# Patient Record
Sex: Female | Born: 1985 | Race: Black or African American | Hispanic: No | Marital: Single | Smoking: Never smoker
Health system: Southern US, Community
[De-identification: ages and names within clinical notes are randomized; demographics above are authoritative.]

## PROBLEM LIST (undated history)

## (undated) DIAGNOSIS — J45909 Unspecified asthma, uncomplicated: Secondary | ICD-10-CM

## (undated) DIAGNOSIS — K2970 Gastritis, unspecified, without bleeding: Secondary | ICD-10-CM

## (undated) DIAGNOSIS — K219 Gastro-esophageal reflux disease without esophagitis: Secondary | ICD-10-CM

## (undated) DIAGNOSIS — K5090 Crohn's disease, unspecified, without complications: Secondary | ICD-10-CM

## (undated) DIAGNOSIS — D649 Anemia, unspecified: Secondary | ICD-10-CM

## (undated) DIAGNOSIS — I10 Essential (primary) hypertension: Secondary | ICD-10-CM

## (undated) DIAGNOSIS — G4700 Insomnia, unspecified: Secondary | ICD-10-CM

## (undated) HISTORY — DX: Insomnia, unspecified: G47.00

## (undated) HISTORY — PX: BOWEL RESECTION: SHX1257

## (undated) HISTORY — DX: Gastro-esophageal reflux disease without esophagitis: K21.9

## (undated) HISTORY — DX: Unspecified asthma, uncomplicated: J45.909

## (undated) HISTORY — DX: Anemia, unspecified: D64.9

---

## 2019-01-27 ENCOUNTER — Emergency Department (HOSPITAL_COMMUNITY)
Admission: EM | Admit: 2019-01-27 | Discharge: 2019-01-27 | Disposition: A | Discharge status: Home / Self Care | Payer: Medicaid Other | Attending: Emergency Medicine | Admitting: Emergency Medicine

## 2019-01-27 ENCOUNTER — Encounter (HOSPITAL_COMMUNITY): Payer: Self-pay

## 2019-01-27 ENCOUNTER — Other Ambulatory Visit: Payer: Self-pay

## 2019-01-27 DIAGNOSIS — Z5321 Procedure and treatment not carried out due to patient leaving prior to being seen by health care provider: Secondary | ICD-10-CM | POA: Diagnosis not present

## 2019-01-27 DIAGNOSIS — R109 Unspecified abdominal pain: Secondary | ICD-10-CM | POA: Diagnosis not present

## 2019-01-27 LAB — URINALYSIS, ROUTINE W REFLEX MICROSCOPIC
Bacteria, UA: NONE SEEN
Bilirubin Urine: NEGATIVE
Glucose, UA: NEGATIVE mg/dL
Hgb urine dipstick: NEGATIVE
Ketones, ur: NEGATIVE mg/dL
Leukocytes,Ua: NEGATIVE
Nitrite: NEGATIVE
Protein, ur: 30 mg/dL — AB
Specific Gravity, Urine: 1.027 (ref 1.005–1.030)
pH: 6 (ref 5.0–8.0)

## 2019-01-27 LAB — COMPREHENSIVE METABOLIC PANEL
ALT: 38 U/L (ref 0–44)
AST: 27 U/L (ref 15–41)
Albumin: 4.3 g/dL (ref 3.5–5.0)
Alkaline Phosphatase: 61 U/L (ref 38–126)
Anion gap: 10 (ref 5–15)
BUN: 11 mg/dL (ref 6–20)
CO2: 23 mmol/L (ref 22–32)
Calcium: 9.6 mg/dL (ref 8.9–10.3)
Chloride: 106 mmol/L (ref 98–111)
Creatinine, Ser: 0.85 mg/dL (ref 0.44–1.00)
GFR calc Af Amer: >60 mL/min (ref >60)
GFR calc non Af Amer: >60 mL/min (ref >60)
Glucose, Bld: 130 mg/dL — ABNORMAL HIGH (ref 70–99)
Potassium: 4 mmol/L (ref 3.5–5.1)
Sodium: 139 mmol/L (ref 135–145)
Total Bilirubin: 0.8 mg/dL (ref 0.3–1.2)
Total Protein: 8.6 g/dL — ABNORMAL HIGH (ref 6.5–8.1)

## 2019-01-27 LAB — CBC
HCT: 40.8 % (ref 36.0–46.0)
Hemoglobin: 12.2 g/dL (ref 12.0–15.0)
MCH: 23.4 pg — ABNORMAL LOW (ref 26.0–34.0)
MCHC: 29.9 g/dL — ABNORMAL LOW (ref 30.0–36.0)
MCV: 78.2 fL — ABNORMAL LOW (ref 80.0–100.0)
Platelets: 273 10*3/uL (ref 150–400)
RBC: 5.22 MIL/uL — ABNORMAL HIGH (ref 3.87–5.11)
RDW: 17.9 % — ABNORMAL HIGH (ref 11.5–15.5)
WBC: 10 10*3/uL (ref 4.0–10.5)
nRBC: 0 % (ref 0.0–0.2)

## 2019-01-27 LAB — LIPASE, BLOOD: Lipase: 24 U/L (ref 11–51)

## 2019-01-27 LAB — I-STAT BETA HCG BLOOD, ED (MC, WL, AP ONLY): I-stat hCG, quantitative: <5 m[IU]/mL (ref <5)

## 2019-01-27 MED ORDER — SODIUM CHLORIDE 0.9% FLUSH: 3.0000 mL | Freq: Once | INTRAVENOUS | Status: DC

## 2019-01-27 NOTE — ED Triage Notes (Signed)
Patient c/o upper abdominal pain with vomiting since 0200 today. Patient denies any diarrhea.

## 2019-01-27 NOTE — ED Notes (Signed)
Pt called in for recheck vs and stated she was told she only had 3 people ahead of her, attempted to explain to pt that we take people according to acuity and pt ripped off bp cuff and left.

## 2019-02-17 ENCOUNTER — Other Ambulatory Visit: Payer: Self-pay

## 2019-02-17 ENCOUNTER — Ambulatory Visit: Payer: Medicaid Other | Admitting: Gastroenterology

## 2019-02-17 ENCOUNTER — Encounter: Payer: Self-pay | Admitting: Gastroenterology

## 2019-02-17 VITALS — BP 110/74 | HR 74 | Temp 97.1°F | Ht 65.0 in | Wt 216.0 lb

## 2019-02-17 DIAGNOSIS — D500 Iron deficiency anemia secondary to blood loss (chronic): Secondary | ICD-10-CM

## 2019-02-17 DIAGNOSIS — E739 Lactose intolerance, unspecified: Secondary | ICD-10-CM

## 2019-02-17 DIAGNOSIS — R1013 Epigastric pain: Secondary | ICD-10-CM | POA: Diagnosis not present

## 2019-02-17 DIAGNOSIS — R195 Other fecal abnormalities: Secondary | ICD-10-CM | POA: Diagnosis not present

## 2019-02-17 DIAGNOSIS — K219 Gastro-esophageal reflux disease without esophagitis: Secondary | ICD-10-CM | POA: Diagnosis not present

## 2019-02-17 MED ORDER — OMEPRAZOLE 40 MG PO CPDR: 40.0000 mg | DELAYED_RELEASE_CAPSULE | Freq: Every day | ORAL | 3 refills | Status: DC

## 2019-02-17 NOTE — Patient Instructions (Signed)
You have been scheduled for an abdominal ultrasound at [facility] (1st floor of hospital) on 02/27/2019 at 9am. Please arrive 15 minutes prior to your appointment for registration. Make certain not to have anything to eat or drink after midnight prior to your appointment. Should you need to reschedule your appointment, please contact radiology at [phone]. This test typically takes about 30 minutes to perform.  You have been scheduled for an endoscopy. Please follow written instructions given to you at your visit today. If you use inhalers (even only as needed), please bring them with you on the day of your procedure.   We will send Omeprazole to your pharmacy    Lactose-Free Diet, Adult If you have lactose intolerance, you are not able to digest lactose. Lactose is a natural sugar found mainly in dairy milk and dairy products. You may need to avoid all foods and beverages that contain lactose. A lactose-free diet can help you do this. Which foods have lactose? Lactose is found in dairy milk and dairy products, such as:  Yogurt.  Cheese.  Butter.  Margarine.  Sour cream.  Cream.  Whipped toppings and nondairy creamers.  Ice cream and other dairy-based desserts. Lactose is also found in foods or products made with dairy milk or milk ingredients. To find out whether a food contains dairy milk or a milk ingredient, look at the ingredients list. Avoid foods with the statement "May contain milk" and foods that contain:  Milk powder.  Whey.  Curd.  Caseinate.  Lactose.  Lactalbumin.  Lactoglobulin. What are alternatives to dairy milk and foods made with milk products?  Lactose-free milk.  Soy milk with added calcium and vitamin D.  Almond milk, coconut milk, rice milk, or other nondairy milk alternatives with added calcium and vitamin D. Note that these are low in protein.  Soy products, such as soy yogurt, soy cheese, soy ice cream, and soy-based sour  cream.  Other nut milk products, such as almond yogurt, almond cheese, cashew yogurt, cashew cheese, cashew ice cream, coconut yogurt, and coconut ice cream. What are tips for following this plan?  Do not consume foods, beverages, vitamins, minerals, or medicines containing lactose. Read ingredient lists carefully.  Look for the words "lactose-free" on labels.  Use lactase enzyme drops or tablets as directed by your health care provider.  Use lactose-free milk or a milk alternative, such as soy milk or almond milk, for drinking and cooking.  Make sure you get enough calcium and vitamin D in your diet. A lactose-free eating plan can be lacking in these important nutrients.  Take calcium and vitamin D supplements as directed by your health care provider. Talk to your health care provider about supplements if you are not able to get enough calcium and vitamin D from food. What foods can I eat?  Fruits All fresh, canned, frozen, or dried fruits that are not processed with lactose. Vegetables All fresh, frozen, and canned vegetables without cheese, cream, or butter sauces. Grains Any that are not made with dairy milk or dairy products. Meats and other proteins Any meat, fish, poultry, and other protein sources that are not made with dairy milk or dairy products. Soy cheese and yogurt. Fats and oils Any that are not made with dairy milk or dairy products. Beverages Lactose-free milk. Soy, rice, or almond milk with added calcium and vitamin D. Fruit and vegetable juices. Sweets and desserts Any that are not made with dairy milk or dairy products. Seasonings and condiments  Any that are not made with dairy milk or dairy products. Calcium Calcium is found in many foods that contain lactose and is important for bone health. The amount of calcium you need depends on your age:  Adults younger than 50 years: 1,000 mg of calcium a day.  Adults older than 50 years: 1,200 mg of calcium a day.  If you are not getting enough calcium, you may get it from other sources, including:  Orange juice with calcium added. There are 300-350 mg of calcium in 1 cup of orange juice.  Calcium-fortified soy milk. There are 300-400 mg of calcium in 1 cup of calcium-fortified soy milk.  Calcium-fortified rice or almond milk. There are 300 mg of calcium in 1 cup of calcium-fortified rice or almond milk.  Calcium-fortified breakfast cereals. There are 100-1,000 mg of calcium in calcium-fortified breakfast cereals.  Spinach, cooked. There are 145 mg of calcium in  cup of cooked spinach.  Edamame, cooked. There are 130 mg of calcium in  cup of cooked edamame.  Collard greens, cooked. There are 125 mg of calcium in  cup of cooked collard greens.  Kale, frozen or cooked. There are 90 mg of calcium in  cup of cooked or frozen kale.  Almonds. There are 95 mg of calcium in  cup of almonds.  Broccoli, cooked. There are 60 mg of calcium in 1 cup of cooked broccoli. The items listed above may not be a complete list of recommended foods and beverages. Contact a dietitian for more options. What foods are not recommended? Fruits None, unless they are made with dairy milk or dairy products. Vegetables None, unless they are made with dairy milk or dairy products. Grains Any grains that are made with dairy milk or dairy products. Meats and other proteins None, unless they are made with dairy milk or dairy products. Dairy All dairy products, including milk, goat's milk, buttermilk, kefir, acidophilus milk, flavored milk, evaporated milk, condensed milk, dulce de Edison, eggnog, yogurt, cheese, and cheese spreads. Fats and oils Any that are made with milk or milk products. Margarines and salad dressings that contain milk or cheese. Cream. Half and half. Cream cheese. Sour cream. Chip dips made with sour cream or yogurt. Beverages Hot chocolate. Cocoa with lactose. Instant iced teas. Powdered fruit  drinks. Smoothies made with dairy milk or yogurt. Sweets and desserts Any that are made with milk or milk products. Seasonings and condiments Chewing gum that has lactose. Spice blends if they contain lactose. Artificial sweeteners that contain lactose. Nondairy creamers. The items listed above may not be a complete list of foods and beverages to avoid. Contact a dietitian for more information. Summary  If you are lactose intolerant, it means that you have a hard time digesting lactose, a natural sugar found in milk and milk products.  Following a lactose-free diet can help you manage this condition.  Calcium is important for bone health and is found in many foods that contain lactose. Talk with your health care provider about other sources of calcium. This information is not intended to replace advice given to you by your health care provider. Make sure you discuss any questions you have with your health care provider. Document Released: 10/24/2001 Document Revised: 06/01/2017 Document Reviewed: 06/01/2017 Elsevier Patient Education  2020 [employer].

## 2019-02-17 NOTE — Progress Notes (Signed)
Morgan Chung    [id number]    26-Nov-1985  Primary Care Physician:Patient, No Pcp Per  Referring Physician: No referring provider defined for this encounter.   Chief complaint: Abdominal pain, GERD, Diarrhea  HPI: 33 year old female here for new patient visit.  She has intermittent upper abdominal pain, is episodic has 1 or 2 episodes every 6 months.  Patient thinks she has had 8 episodes in the past 4 years and she also has associated black tarry stool intermittently. This last episode lasted for about 3 days and she noticed black tarry stool once the pain subsided.  Her bowel habits and color has returned back to baseline.  She has lost around 17 pounds in the past 6 months. She feels reflux/GERD symptoms progressively worse. She has excessive bloating and flatulence with milk and dairy products, also has intermittent diarrhea. She was taking BC powder almost daily previously but has not had any recently.  Denies any family history of GI malignancy, celiac disease or IBD.  No outpatient encounter medications on file as of 02/17/2019.   No facility-administered encounter medications on file as of 02/17/2019.     Allergies as of 02/17/2019 - Review Complete 02/17/2019  Allergen Reaction Noted  . Latex  01/27/2019    History reviewed. No pertinent past medical history.  Past Surgical History:  Procedure Laterality Date  . CESAREAN SECTION      Family History  Problem Relation Age of Onset  . Hypertension Mother   . Irritable bowel syndrome Mother   . Diabetes Father   . Hypertension Father   . Irritable bowel syndrome Sister   . Colon cancer Neg Hx   . Pancreatic cancer Neg Hx   . Stomach cancer Neg Hx   . Esophageal cancer Neg Hx   . Liver cancer Neg Hx     Social History   Socioeconomic History  . Marital status: Single    Spouse name: Not on file  . Number of children: Not on file  . Years of education: Not on file  . Highest education level:  Not on file  Occupational History  . Not on file  Social Needs  . Financial resource strain: Not on file  . Food insecurity    Worry: Not on file    Inability: Not on file  . Transportation needs    Medical: Not on file    Non-medical: Not on file  Tobacco Use  . Smoking status: Never Smoker  . Smokeless tobacco: Never Used  Substance and Sexual Activity  . Alcohol use: Yes    Comment: occasionally  . Drug use: Never  . Sexual activity: Not on file  Lifestyle  . Physical activity    Days per week: Not on file    Minutes per session: Not on file  . Stress: Not on file  Relationships  . Social Herbalist on phone: Not on file    Gets together: Not on file    Attends religious service: Not on file    Active member of club or organization: Not on file    Attends meetings of clubs or organizations: Not on file    Relationship status: Not on file  . Intimate partner violence    Fear of current or ex partner: Not on file    Emotionally abused: Not on file    Physically abused: Not on file    Forced sexual activity: Not on  file  Other Topics Concern  . Not on file  Social History Narrative  . Not on file      Review of systems: Review of Systems  Constitutional: Negative for fever and chills.  HENT: Positive for sinus trouble Eyes: Negative for blurred vision.  Respiratory: Negative for cough, shortness of breath and wheezing.  Positive for shortness of breath Cardiovascular: Negative for chest pain and palpitations.  Gastrointestinal: as per HPI Genitourinary: Negative for dysuria, urgency, frequency and hematuria.  Musculoskeletal: Negative for myalgias, back pain and joint pain.  Skin: Negative for itching and rash.  Neurological: Negative for dizziness, tremors, focal weakness, seizures and loss of consciousness. Positive for headaches Endo/Heme/Allergies: Positive for seasonal allergies.  Psychiatric/Behavioral: Negative for depression, suicidal ideas  and hallucinations. Positive for anxiety and insomnia. All other systems reviewed and are negative.   Physical Exam: Vitals:   02/17/19 1408  Temp: (!) 97.1 F (36.2 C)   Body mass index is 37.08 kg/m. Gen:      No acute distress HEENT:  EOMI, sclera anicteric Neck:     No masses; no thyromegaly Lungs:    Clear to auscultation bilaterally; normal respiratory effort CV:         Regular rate and rhythm; no murmurs Abd:      + bowel sounds; soft, non-tender; no palpable masses, no distension Ext:    No edema; adequate peripheral perfusion Skin:      Warm and dry; no rash Neuro: alert and oriented x 3 Psych: normal mood and affect  Data Reviewed:  Reviewed labs, radiology imaging, old records and pertinent past GI work up   Assessment and Plan/Recommendations:  33 year old female with history of chronic GERD, lactose intolerance, complains of intermittent epigastric abdominal pain and melena Reviewed recent labs, normal hemoglobin and hematocrit with low MCV suggestive of iron deficiency Start omeprazole 40 mg daily Avoid NSAIDs We will schedule for EGD to exclude peptic ulcer disease or gastritis  Abdominal ultrasound to exclude gallstones/gallbladder disease  Discussed lactose-free diet  The risks and benefits as well as alternatives of endoscopic procedure(s) have been discussed and reviewed. All questions answered. The patient agrees to proceed.   Iona Beard , MD    CC: No ref. provider found

## 2019-02-23 ENCOUNTER — Emergency Department (HOSPITAL_COMMUNITY)
Admission: EM | Admit: 2019-02-23 | Discharge: 2019-02-23 | Discharge status: Absent without leave | Payer: Medicaid Other

## 2019-02-23 ENCOUNTER — Telehealth: Payer: Self-pay

## 2019-02-23 ENCOUNTER — Encounter: Payer: Self-pay | Admitting: Gastroenterology

## 2019-02-23 NOTE — Telephone Encounter (Signed)
Covid-19 screening questions   Do you now or have you had a fever in the last 14 days?  Do you have any respiratory symptoms of shortness of breath or cough now or in the last 14 days?  Do you have any family members or close contacts with diagnosed or suspected Covid-19 in the past 14 days?  Have you been tested for Covid-19 and found to be positive?       

## 2019-02-24 ENCOUNTER — Ambulatory Visit (AMBULATORY_SURGERY_CENTER): Payer: Medicaid Other | Admitting: Gastroenterology

## 2019-02-24 ENCOUNTER — Other Ambulatory Visit: Payer: Self-pay

## 2019-02-24 ENCOUNTER — Encounter: Payer: Self-pay | Admitting: Gastroenterology

## 2019-02-24 VITALS — BP 116/77 | HR 72 | Temp 98.2°F | Resp 21 | Ht 65.0 in | Wt 216.0 lb

## 2019-02-24 DIAGNOSIS — R1013 Epigastric pain: Secondary | ICD-10-CM

## 2019-02-24 DIAGNOSIS — K2970 Gastritis, unspecified, without bleeding: Secondary | ICD-10-CM | POA: Diagnosis not present

## 2019-02-24 MED ORDER — SODIUM CHLORIDE 0.9 % IV SOLN: 500.0000 mL | Freq: Once | INTRAVENOUS | Status: DC

## 2019-02-24 NOTE — Progress Notes (Signed)
Vitals-Nancy C. Temp-JunePt's states no medical or surgical changes since previsit or office visit.

## 2019-02-24 NOTE — Progress Notes (Signed)
To PACU, VSS. Report to [email or web address] 

## 2019-02-24 NOTE — Op Note (Signed)
[facility] Patient Name: Morgan Chung Procedure Date: 02/24/2019 3:27 PM MRN: [id number] Endoscopist: Napoleon FormKavitha V. Nandigam , MD Age: 33 Referring MD:  Date of Birth: [birth year] Gender: Female Account #: [id number] Procedure:                Upper GI endoscopy Indications:              Epigastric abdominal pain Medicines:                Monitored Anesthesia Care Procedure:                Pre-Anesthesia Assessment:                           - Prior to the procedure, a History and Physical                            was performed, and patient medications and                            allergies were reviewed. The patient's tolerance of                            previous anesthesia was also reviewed. The risks                            and benefits of the procedure and the sedation                            options and risks were discussed with the patient.                            All questions were answered, and informed consent                            was obtained. Prior Anticoagulants: The patient has                            taken no previous anticoagulant or antiplatelet                            agents. ASA Grade Assessment: II - A patient with                            mild systemic disease. After reviewing the risks                            and benefits, the patient was deemed in                            satisfactory condition to undergo the procedure.                           After obtaining informed consent, the endoscope was  passed under direct vision. Throughout the                            procedure, the patient's blood pressure, pulse, and                            oxygen saturations were monitored continuously. The                            Endoscope was introduced through the mouth, and                            advanced to the second part of duodenum. The upper                            GI endoscopy was  accomplished without difficulty.                            The patient tolerated the procedure well. Scope In: Scope Out: Findings:                 The Z-line was regular and was found 35 cm from the                            incisors.                           The esophagus was normal.                           Patchy mild inflammation characterized by                            congestion (edema) and erythema was found in the                            prepyloric region of the stomach. Biopsies were                            taken with a cold forceps for Helicobacter pylori                            testing.                           The examined duodenum was normal. Complications:            No immediate complications. Estimated Blood Loss:     Estimated blood loss was minimal. Impression:               - Z-line regular, 35 cm from the incisors.                           - Normal esophagus.                           -  Gastritis. Biopsied.                           - Normal examined duodenum. Recommendation:           - Patient has a contact number available for                            emergencies. The signs and symptoms of potential                            delayed complications were discussed with the                            patient. Return to normal activities tomorrow.                            Written discharge instructions were provided to the                            patient.                           - Resume previous diet.                           - Continue present medications.                           - Await pathology results.                           - Return to GI office in 3 months. Napoleon Form, MD 02/24/2019 3:41:33 PM This report has been signed electronically.

## 2019-02-24 NOTE — Patient Instructions (Signed)
Discharge instructions given. Biopsies taken. Resume previous medications. YOU HAD AN ENDOSCOPIC PROCEDURE TODAY AT THE  ENDOSCOPY CENTER:   Refer to the procedure report that was given to you for any specific questions about what was found during the examination.  If the procedure report does not answer your questions, please call your gastroenterologist to clarify.  If you requested that your care partner not be given the details of your procedure findings, then the procedure report has been included in a sealed envelope for you to review at your convenience later.  YOU SHOULD EXPECT: Some feelings of bloating in the abdomen. Passage of more gas than usual.  Walking can help get rid of the air that was put into your GI tract during the procedure and reduce the bloating. If you had a lower endoscopy (such as a colonoscopy or flexible sigmoidoscopy) you may notice spotting of blood in your stool or on the toilet paper. If you underwent a bowel prep for your procedure, you may not have a normal bowel movement for a few days.  Please Note:  You might notice some irritation and congestion in your nose or some drainage.  This is from the oxygen used during your procedure.  There is no need for concern and it should clear up in a day or so.  SYMPTOMS TO REPORT IMMEDIATELY:   Following upper endoscopy (EGD)  Vomiting of blood or coffee ground material  New chest pain or pain under the shoulder blades  Painful or persistently difficult swallowing  New shortness of breath  Fever of 100F or higher  Black, tarry-looking stools  For urgent or emergent issues, a gastroenterologist can be reached at any hour by calling [phone].   DIET:  We do recommend a small meal at first, but then you may proceed to your regular diet.  Drink plenty of fluids but you should avoid alcoholic beverages for 24 hours.  ACTIVITY:  You should plan to take it easy for the rest of today and you should NOT DRIVE  or use heavy machinery until tomorrow (because of the sedation medicines used during the test).    FOLLOW UP: Our staff will call the number listed on your records 48-72 hours following your procedure to check on you and address any questions or concerns that you may have regarding the information given to you following your procedure. If we do not reach you, we will leave a message.  We will attempt to reach you two times.  During this call, we will ask if you have developed any symptoms of COVID 19. If you develop any symptoms (ie: fever, flu-like symptoms, shortness of breath, cough etc.) before then, please call [phone].  If you test positive for Covid 19 in the 2 weeks post procedure, please call and report this information to us.    If any biopsies were taken you will be contacted by phone or by letter within the next 1-3 weeks.  Please call us at [phone] if you have not heard about the biopsies in 3 weeks.    SIGNATURES/CONFIDENTIALITY: You and/or your care partner have signed paperwork which will be entered into your electronic medical record.  These signatures attest to the fact that that the information above on your After Visit Summary has been reviewed and is understood.  Full responsibility of the confidentiality of this discharge information lies with you and/or your care-partner. 

## 2019-02-27 ENCOUNTER — Ambulatory Visit (HOSPITAL_COMMUNITY): Payer: Medicaid Other

## 2019-02-28 ENCOUNTER — Telehealth: Payer: Self-pay | Admitting: *Deleted

## 2019-02-28 NOTE — Telephone Encounter (Signed)
Left message on f/u call 

## 2019-02-28 NOTE — Telephone Encounter (Signed)
  Follow up Call-  Call back number 02/24/2019  Post procedure Call Back phone  # [phone]  Permission to leave phone message Yes     Patient questions:  Do you have a fever, pain , or abdominal swelling? No. Pain Score  0 *  Have you tolerated food without any problems? Yes.    Have you been able to return to your normal activities? Yes.    Do you have any questions about your discharge instructions: Diet   No. Medications  No. Follow up visit  No.  Do you have questions or concerns about your Care? No.  Actions: * If pain score is 4 or above: No action needed, pain <4.  1. Have you developed a fever since your procedure? no  2.   Have you had an respiratory symptoms (SOB or cough) since your procedure? no  3.   Have you tested positive for COVID 19 since your procedure no  4.   Have you had any family members/close contacts diagnosed with the COVID 19 since your procedure?  no   If yes to any of these questions please route to Joylene John, RN and Alphonsa Gin, Therapist, sports.

## 2019-03-02 ENCOUNTER — Encounter: Payer: Self-pay | Admitting: Gastroenterology

## 2019-03-27 ENCOUNTER — Other Ambulatory Visit: Payer: Self-pay | Admitting: Family Medicine

## 2019-03-27 DIAGNOSIS — R3915 Urgency of urination: Secondary | ICD-10-CM

## 2019-03-29 ENCOUNTER — Other Ambulatory Visit: Payer: Self-pay | Admitting: Family Medicine

## 2019-03-29 DIAGNOSIS — N644 Mastodynia: Secondary | ICD-10-CM

## 2019-04-12 ENCOUNTER — Other Ambulatory Visit: Payer: Self-pay | Admitting: Family Medicine

## 2019-04-17 ENCOUNTER — Other Ambulatory Visit: Payer: Self-pay | Admitting: Family Medicine

## 2019-04-17 DIAGNOSIS — N139 Obstructive and reflux uropathy, unspecified: Secondary | ICD-10-CM

## 2019-04-21 ENCOUNTER — Ambulatory Visit
Admission: RE | Admit: 2019-04-21 | Discharge: 2019-04-21 | Disposition: A | Discharge status: Home / Self Care | Payer: Medicaid Other | Source: Ambulatory Visit | Attending: Family Medicine | Admitting: Family Medicine

## 2019-04-21 ENCOUNTER — Ambulatory Visit: Payer: Medicaid Other

## 2019-04-21 ENCOUNTER — Other Ambulatory Visit: Payer: Self-pay

## 2019-04-21 DIAGNOSIS — N644 Mastodynia: Secondary | ICD-10-CM

## 2019-04-24 ENCOUNTER — Other Ambulatory Visit: Payer: Medicaid Other

## 2019-04-25 ENCOUNTER — Other Ambulatory Visit: Payer: Self-pay | Admitting: Family Medicine

## 2019-04-26 ENCOUNTER — Other Ambulatory Visit: Payer: Medicaid Other

## 2020-01-08 ENCOUNTER — Telehealth: Payer: Self-pay | Admitting: Gastroenterology

## 2020-01-08 NOTE — Telephone Encounter (Signed)
Pt states she is experiencing gastro problems, pt would like to be seen asap by Dr Lavon Paganini but nothing available before Oct.

## 2020-01-09 NOTE — Telephone Encounter (Signed)
There was a opportunity to move her appointment to 01/10/20 at 8:10 am. Patient agreed to this appointment.

## 2020-01-10 ENCOUNTER — Ambulatory Visit: Payer: Medicaid Other | Admitting: Gastroenterology

## 2020-01-10 ENCOUNTER — Encounter: Payer: Self-pay | Admitting: Gastroenterology

## 2020-01-10 VITALS — BP 106/68 | HR 104 | Ht 65.0 in | Wt 208.0 lb

## 2020-01-10 DIAGNOSIS — R110 Nausea: Secondary | ICD-10-CM | POA: Diagnosis not present

## 2020-01-10 DIAGNOSIS — R1013 Epigastric pain: Secondary | ICD-10-CM | POA: Diagnosis not present

## 2020-01-10 DIAGNOSIS — K219 Gastro-esophageal reflux disease without esophagitis: Secondary | ICD-10-CM | POA: Diagnosis not present

## 2020-01-10 DIAGNOSIS — K588 Other irritable bowel syndrome: Secondary | ICD-10-CM

## 2020-01-10 DIAGNOSIS — R109 Unspecified abdominal pain: Secondary | ICD-10-CM

## 2020-01-10 MED ORDER — ONDANSETRON HCL 4 MG PO TABS: 4.0000 mg | ORAL_TABLET | Freq: Three times a day (TID) | ORAL | 3 refills | Status: DC | PRN

## 2020-01-10 MED ORDER — DICYCLOMINE HCL 20 MG PO TABS: 20.0000 mg | ORAL_TABLET | Freq: Three times a day (TID) | ORAL | 3 refills | Status: DC | PRN

## 2020-01-10 MED ORDER — OMEPRAZOLE 40 MG PO CPDR: 40.0000 mg | DELAYED_RELEASE_CAPSULE | Freq: Every day | ORAL | 3 refills | Status: DC

## 2020-01-10 NOTE — Patient Instructions (Addendum)
You have been scheduled for an abdominal ultrasound at [facility]iology (1st floor of hospital) on 01/16/2020 at 8:30am. Please arrive 15 minutes prior to your appointment for registration. Make certain not to have anything to eat or drink after midnight prior to your appointment. Should you need to reschedule your appointment, please contact radiology at [phone]. This test typically takes about 30 minutes to perform.  We will refill Omeprazole, Dicyclomine and Zofran for you today  Follow up in 4 months   I appreciate the  opportunity to care for you  Thank You   Marsa Aris , MD

## 2020-01-10 NOTE — Progress Notes (Signed)
Morgan Chung    [id number]    Apr 14, 1986  Primary Care Physician:Patient, No Pcp Per  Referring Physician: No referring provider defined for this encounter.   Chief complaint:  Nausea, abdominal pain  HPI:  34 year old very pleasant female here for follow-up visit with complaints of persistent nausea and intermittent vomiting.  She continues to have epigastric abdominal pain. Symptoms worse when she eats heavy meals  EGD 02/24/2019: Gastritis otherwise normal exam. Negative for H.pylori  She is taking omeprazole intermittently.  Denies any dysphagia or heartburn when she takes omeprazole.  She has not done abdominal ultrasound yet, was not covered by her insurance and her symptoms improved for a few months before she started having recurrent symptoms  No change in bowel habits, decreased appetite or weight loss.     Outpatient Encounter Medications as of 01/10/2020  Medication Sig  . ARIPiprazole (ABILIFY) 2 MG tablet Take 2 mg by mouth daily.  Marland Kitchen omeprazole (PRILOSEC) 40 MG capsule Take 1 capsule (40 mg total) by mouth daily.  . propranolol (INDERAL) 10 MG tablet Take 10 mg by mouth 3 (three) times daily as needed.  . zolpidem (AMBIEN) 10 MG tablet Take 10 mg by mouth at bedtime as needed.  . [DISCONTINUED] omeprazole (PRILOSEC) 40 MG capsule Take 1 capsule (40 mg total) by mouth daily.  Marland Kitchen dicyclomine (BENTYL) 20 MG tablet Take 1 tablet (20 mg total) by mouth every 8 (eight) hours as needed for spasms.  . ondansetron (ZOFRAN) 4 MG tablet Take 1 tablet (4 mg total) by mouth every 8 (eight) hours as needed for nausea or vomiting (as needed).   No facility-administered encounter medications on file as of 01/10/2020.    Allergies as of 01/10/2020 - Review Complete 01/10/2020  Allergen Reaction Noted  . Latex  01/27/2019    Past Medical History:  Diagnosis Date  . GERD (gastroesophageal reflux disease)   . Insomnia     Past Surgical History:  Procedure  Laterality Date  . CESAREAN SECTION      Family History  Problem Relation Age of Onset  . Hypertension Mother   . Irritable bowel syndrome Mother   . Diabetes Father   . Hypertension Father   . Irritable bowel syndrome Sister   . Colon cancer Neg Hx   . Pancreatic cancer Neg Hx   . Stomach cancer Neg Hx   . Esophageal cancer Neg Hx   . Liver cancer Neg Hx     Social History   Socioeconomic History  . Marital status: Single    Spouse name: Not on file  . Number of children: Not on file  . Years of education: Not on file  . Highest education level: Not on file  Occupational History  . Not on file  Tobacco Use  . Smoking status: Never Smoker  . Smokeless tobacco: Never Used  Vaping Use  . Vaping Use: Never used  Substance and Sexual Activity  . Alcohol use: Yes    Comment: occasionally  . Drug use: Never  . Sexual activity: Yes    Partners: Male  Other Topics Concern  . Not on file  Social History Narrative  . Not on file   Social Determinants of Health   Financial Resource Strain:   . Difficulty of Paying Living Expenses: Not on file  Food Insecurity:   . Worried About Programme researcher, broadcasting/film/video in the Last Year: Not on file  . Ran Out  of Food in the Last Year: Not on file  Transportation Needs:   . Lack of Transportation (Medical): Not on file  . Lack of Transportation (Non-Medical): Not on file  Physical Activity:   . Days of Exercise per Week: Not on file  . Minutes of Exercise per Session: Not on file  Stress:   . Feeling of Stress : Not on file  Social Connections:   . Frequency of Communication with Friends and Family: Not on file  . Frequency of Social Gatherings with Friends and Family: Not on file  . Attends Religious Services: Not on file  . Active Member of Clubs or Organizations: Not on file  . Attends Banker Meetings: Not on file  . Marital Status: Not on file  Intimate Partner Violence:   . Fear of Current or Ex-Partner: Not on  file  . Emotionally Abused: Not on file  . Physically Abused: Not on file  . Sexually Abused: Not on file      Review of systems: All other review of systems negative except as mentioned in the HPI.   Physical Exam: Vitals:   01/10/20 0823  BP: 106/68  Pulse: (!) 104   Body mass index is 34.61 kg/m. Gen:      No acute distress HEENT:  sclera anicteric Abd:      soft, non-tender; no palpable masses, no distension Ext:    No edema Neuro: alert and oriented x 3 Psych: normal mood and affect  Data Reviewed:  Reviewed labs, radiology imaging, old records and pertinent past GI work up   Assessment and Plan/Recommendations:  34 year old very pleasant female with complaints of epigastric abdominal pain and nausea  We will obtain abdominal ultrasound to exclude gallbladder disease  GERD and gastritis: Omeprazole 40 mg daily Antireflux measures  Nausea: Use Zofran as needed  IBS/abdominal cramping: Dicyclomine 3 mg every 8 hours as needed  Return in 4 months or sooner if needed   The patient was provided an opportunity to ask questions and all were answered. The patient agreed with the plan and demonstrated an understanding of the instructions.  Iona Beard , MD    CC: No ref. provider found

## 2020-01-16 ENCOUNTER — Ambulatory Visit (HOSPITAL_COMMUNITY): Admission: RE | Admit: 2020-01-16 | Payer: Medicaid Other | Source: Ambulatory Visit

## 2020-01-24 ENCOUNTER — Encounter: Payer: Self-pay | Admitting: Gastroenterology

## 2020-03-07 ENCOUNTER — Ambulatory Visit: Payer: Medicaid Other | Admitting: Gastroenterology

## 2020-05-20 ENCOUNTER — Ambulatory Visit: Payer: Medicaid Other | Admitting: Obstetrics

## 2020-07-10 ENCOUNTER — Ambulatory Visit: Payer: Medicaid Other | Admitting: Gastroenterology

## 2020-07-27 ENCOUNTER — Emergency Department (HOSPITAL_COMMUNITY)
Admission: EM | Admit: 2020-07-27 | Discharge: 2020-07-27 | Disposition: A | Discharge status: Home / Self Care | Payer: Medicaid Other | Attending: Emergency Medicine | Admitting: Emergency Medicine

## 2020-07-27 ENCOUNTER — Encounter (HOSPITAL_COMMUNITY): Payer: Self-pay | Admitting: Emergency Medicine

## 2020-07-27 DIAGNOSIS — Z91040 Latex allergy status: Secondary | ICD-10-CM | POA: Diagnosis not present

## 2020-07-27 DIAGNOSIS — R112 Nausea with vomiting, unspecified: Secondary | ICD-10-CM | POA: Insufficient documentation

## 2020-07-27 DIAGNOSIS — R197 Diarrhea, unspecified: Secondary | ICD-10-CM | POA: Diagnosis not present

## 2020-07-27 DIAGNOSIS — R1013 Epigastric pain: Secondary | ICD-10-CM | POA: Diagnosis not present

## 2020-07-27 DIAGNOSIS — K219 Gastro-esophageal reflux disease without esophagitis: Secondary | ICD-10-CM | POA: Diagnosis not present

## 2020-07-27 DIAGNOSIS — Z79899 Other long term (current) drug therapy: Secondary | ICD-10-CM | POA: Insufficient documentation

## 2020-07-27 LAB — COMPREHENSIVE METABOLIC PANEL
ALT: 13 U/L (ref 0–44)
AST: 16 U/L (ref 15–41)
Albumin: 3.9 g/dL (ref 3.5–5.0)
Alkaline Phosphatase: 53 U/L (ref 38–126)
Anion gap: 12 (ref 5–15)
BUN: 10 mg/dL (ref 6–20)
CO2: 20 mmol/L — ABNORMAL LOW (ref 22–32)
Calcium: 9.5 mg/dL (ref 8.9–10.3)
Chloride: 103 mmol/L (ref 98–111)
Creatinine, Ser: 0.79 mg/dL (ref 0.44–1.00)
GFR, Estimated: >60 mL/min (ref >60)
Glucose, Bld: 111 mg/dL — ABNORMAL HIGH (ref 70–99)
Potassium: 3.6 mmol/L (ref 3.5–5.1)
Sodium: 135 mmol/L (ref 135–145)
Total Bilirubin: 0.5 mg/dL (ref 0.3–1.2)
Total Protein: 8.1 g/dL (ref 6.5–8.1)

## 2020-07-27 LAB — I-STAT BETA HCG BLOOD, ED (MC, WL, AP ONLY): I-stat hCG, quantitative: <5 m[IU]/mL (ref <5)

## 2020-07-27 LAB — URINALYSIS, ROUTINE W REFLEX MICROSCOPIC
Bilirubin Urine: NEGATIVE
Glucose, UA: NEGATIVE mg/dL
Hgb urine dipstick: NEGATIVE
Ketones, ur: NEGATIVE mg/dL
Leukocytes,Ua: NEGATIVE
Nitrite: NEGATIVE
Protein, ur: NEGATIVE mg/dL
Specific Gravity, Urine: 1.008 (ref 1.005–1.030)
pH: 8 (ref 5.0–8.0)

## 2020-07-27 LAB — CBC
HCT: 37.1 % (ref 36.0–46.0)
Hemoglobin: 11.1 g/dL — ABNORMAL LOW (ref 12.0–15.0)
MCH: 22 pg — ABNORMAL LOW (ref 26.0–34.0)
MCHC: 29.9 g/dL — ABNORMAL LOW (ref 30.0–36.0)
MCV: 73.5 fL — ABNORMAL LOW (ref 80.0–100.0)
Platelets: 284 10*3/uL (ref 150–400)
RBC: 5.05 MIL/uL (ref 3.87–5.11)
RDW: 17.9 % — ABNORMAL HIGH (ref 11.5–15.5)
WBC: 9.8 10*3/uL (ref 4.0–10.5)
nRBC: 0 % (ref 0.0–0.2)

## 2020-07-27 LAB — LIPASE, BLOOD: Lipase: 23 U/L (ref 11–51)

## 2020-07-27 MED ORDER — DICYCLOMINE HCL 10 MG PO CAPS
10.0000 mg | ORAL_CAPSULE | Freq: Once | ORAL | Status: AC
  Administered 2020-07-27: 10 mg via ORAL
  Filled 2020-07-27: qty 1

## 2020-07-27 MED ORDER — ONDANSETRON 4 MG PO TBDP: 4.0000 mg | ORAL_TABLET | Freq: Three times a day (TID) | ORAL | 0 refills | Status: DC | PRN

## 2020-07-27 MED ORDER — ONDANSETRON 4 MG PO TBDP
4.0000 mg | ORAL_TABLET | Freq: Once | ORAL | Status: AC | PRN
  Administered 2020-07-27: 4 mg via ORAL
  Filled 2020-07-27: qty 1

## 2020-07-27 MED ORDER — SODIUM CHLORIDE 0.9 % IV BOLUS
1000.0000 mL | Freq: Once | INTRAVENOUS | Status: AC
  Administered 2020-07-27: 1000 mL via INTRAVENOUS

## 2020-07-27 MED ORDER — PANTOPRAZOLE SODIUM 40 MG IV SOLR
40.0000 mg | Freq: Once | INTRAVENOUS | Status: AC
  Administered 2020-07-27: 40 mg via INTRAVENOUS
  Filled 2020-07-27: qty 40

## 2020-07-27 MED ORDER — MORPHINE SULFATE (PF) 4 MG/ML IV SOLN
4.0000 mg | Freq: Once | INTRAVENOUS | Status: AC
  Administered 2020-07-27: 4 mg via INTRAVENOUS
  Filled 2020-07-27: qty 1

## 2020-07-27 MED ORDER — ONDANSETRON HCL 4 MG/2ML IJ SOLN
4.0000 mg | Freq: Once | INTRAMUSCULAR | Status: AC
  Administered 2020-07-27: 4 mg via INTRAVENOUS
  Filled 2020-07-27: qty 2

## 2020-07-27 MED ORDER — METHOCARBAMOL 500 MG PO TABS: 500.0000 mg | ORAL_TABLET | Freq: Two times a day (BID) | ORAL | 0 refills | Status: DC

## 2020-07-27 NOTE — ED Triage Notes (Signed)
Per EMS, patient from home, c/o abdominal pain with N/V x 4 days. Hx gastritis.

## 2020-07-27 NOTE — Discharge Instructions (Signed)
Your work-up today was reassuring, please follow-up with your GI doctor.  Continue to take your medications for your acid reflux in addition to the Bentyl.  I also prescribed you some more Zofran, make sure to stay hydrated.  Also prescribed you the muscle relaxant as requested, please speak to your pharmacist about any new medications in regards to side effects or interactions with other medications today.  If you have any new worsening concerning symptom please come back to the emergency department.

## 2020-07-27 NOTE — ED Provider Notes (Signed)
[facility]-EMERGENCY DEPT Provider Note   CSN: [id number] Arrival date & time: 07/27/20  1243     History Chief Complaint  Patient presents with  . Abdominal Pain  . Emesis    Morgan Chung is a 35 y.o. female past medical history of GERD, gastritis that presents the emergency department today for complaint of epigastric abdominal pain with nausea vomiting for the last 4 days.  Patient states that she has not been able to take any of her medications or tolerate p.o. since last night.  States that she woke up at 4 AM with continuous vomiting, no hematemesis.  Patient states that she does have a history of gastritis, however this is the worst that its been, states that she has been able to take any of her medications. Is on zofran, bentyl, PPI.  States that she did have an episode of diarrhea last night as well, nonbloody.  Has not any diarrhea today.  Patient denies any fevers or chills, does have a temperature of 99.5 here.  Denies any headache, URI symptoms, joint pain, myalgias, back pain.  Denies any dysuria or hematuria.  Denies any vaginal complaints.  Patient describes her epigastric pain as a squeezing sensation in the center of her abdomen, last for about a couple minutes and then will be relieved.  States that when this relieved the pain is a 3 out of 10 minutes dull, however when the pain is at its worst is a 10 out of 10.  Does not radiate anywhere.  Does have a GI doctor that she follows, next appointment is in a month.  No chest pain or shortness of breath.  HPI     Past Medical History:  Diagnosis Date  . GERD (gastroesophageal reflux disease)   . Insomnia     There are no problems to display for this patient.   Past Surgical History:  Procedure Laterality Date  . CESAREAN SECTION       OB History   No obstetric history on file.     Family History  Problem Relation Age of Onset  . Hypertension Mother   . Irritable bowel syndrome Mother   .  Diabetes Father   . Hypertension Father   . Irritable bowel syndrome Sister   . Colon cancer Neg Hx   . Pancreatic cancer Neg Hx   . Stomach cancer Neg Hx   . Esophageal cancer Neg Hx   . Liver cancer Neg Hx     Social History   Tobacco Use  . Smoking status: Never Smoker  . Smokeless tobacco: Never Used  Vaping Use  . Vaping Use: Never used  Substance Use Topics  . Alcohol use: Yes    Comment: occasionally  . Drug use: Never    Home Medications Prior to Admission medications   Medication Sig Start Date End Date Taking? Authorizing Provider  methocarbamol (ROBAXIN) 500 MG tablet Take 1 tablet (500 mg total) by mouth 2 (two) times daily. 07/27/20  Yes Farrel Gordon, PA-C  ondansetron (ZOFRAN ODT) 4 MG disintegrating tablet Take 1 tablet (4 mg total) by mouth every 8 (eight) hours as needed for nausea or vomiting. 07/27/20  Yes Farrel Gordon, PA-C  ARIPiprazole (ABILIFY) 2 MG tablet Take 2 mg by mouth daily. 01/09/20   [provider]  dicyclomine (BENTYL) 20 MG tablet Take 1 tablet (20 mg total) by mouth every 8 (eight) hours as needed for spasms. 01/10/20   Napoleon Form, MD  omeprazole (PRILOSEC)  40 MG capsule Take 1 capsule (40 mg total) by mouth daily. 01/10/20   Napoleon Form, MD  propranolol (INDERAL) 10 MG tablet Take 10 mg by mouth 3 (three) times daily as needed. 11/15/19   [provider]  zolpidem (AMBIEN) 10 MG tablet Take 10 mg by mouth at bedtime as needed. 01/09/20   [provider]    Allergies    Latex  Review of Systems   Review of Systems  Constitutional: Negative for chills, diaphoresis, fatigue and fever.  HENT: Negative for congestion, sore throat and trouble swallowing.   Eyes: Negative for pain and visual disturbance.  Respiratory: Negative for cough, shortness of breath and wheezing.   Cardiovascular: Negative for chest pain, palpitations and leg swelling.  Gastrointestinal: Positive for abdominal pain, diarrhea,  nausea and vomiting. Negative for abdominal distention.  Genitourinary: Negative for difficulty urinating.  Musculoskeletal: Negative for back pain, neck pain and neck stiffness.  Skin: Negative for pallor.  Neurological: Negative for dizziness, speech difficulty, weakness and headaches.  Psychiatric/Behavioral: Negative for confusion.    Physical Exam Updated Vital Signs BP 124/79   Pulse 68   Temp 99.5 F (37.5 C) (Oral)   Resp 13   LMP 07/13/2020   SpO2 100%   Physical Exam Constitutional:      General: She is not in acute distress.    Appearance: Normal appearance. She is not ill-appearing, toxic-appearing or diaphoretic.  HENT:     Mouth/Throat:     Mouth: Mucous membranes are moist.     Pharynx: Oropharynx is clear.  Eyes:     General: No scleral icterus.    Extraocular Movements: Extraocular movements intact.     Pupils: Pupils are equal, round, and reactive to light.  Cardiovascular:     Rate and Rhythm: Normal rate and regular rhythm.     Pulses: Normal pulses.     Heart sounds: Normal heart sounds.  Pulmonary:     Effort: Pulmonary effort is normal. No respiratory distress.     Breath sounds: Normal breath sounds. No stridor. No wheezing, rhonchi or rales.  Chest:     Chest wall: No tenderness.  Abdominal:     General: Abdomen is flat. There is no distension.     Palpations: Abdomen is soft.     Tenderness: There is no abdominal tenderness. There is no guarding or rebound.  Musculoskeletal:        General: No swelling or tenderness. Normal range of motion.     Cervical back: Normal range of motion and neck supple. No rigidity.     Right lower leg: No edema.     Left lower leg: No edema.  Skin:    General: Skin is warm and dry.     Capillary Refill: Capillary refill takes less than 2 seconds.     Coloration: Skin is not pale.  Neurological:     General: No focal deficit present.     Mental Status: She is alert and oriented to person, place, and time.   Psychiatric:        Mood and Affect: Mood normal.        Behavior: Behavior normal.     ED Results / Procedures / Treatments   Labs (all labs ordered are listed, but only abnormal results are displayed) Labs Reviewed  COMPREHENSIVE METABOLIC PANEL - Abnormal; Notable for the following components:      Result Value   CO2 20 (*)    Glucose, Bld 111 (*)  All other components within normal limits  CBC - Abnormal; Notable for the following components:   Hemoglobin 11.1 (*)    MCV 73.5 (*)    MCH 22.0 (*)    MCHC 29.9 (*)    RDW 17.9 (*)    All other components within normal limits  URINALYSIS, ROUTINE W REFLEX MICROSCOPIC - Abnormal; Notable for the following components:   Color, Urine STRAW (*)    All other components within normal limits  LIPASE, BLOOD  I-STAT BETA HCG BLOOD, ED ([facility], [facility], [facility] ONLY)    EKG None  Radiology No results found.  Procedures Procedures   Medications Ordered in ED Medications  ondansetron (ZOFRAN-ODT) disintegrating tablet 4 mg (4 mg Oral Given 07/27/20 1252)  sodium chloride 0.9 % bolus 1,000 mL (1,000 mLs Intravenous New Bag/Given 07/27/20 1458)  ondansetron (ZOFRAN) injection 4 mg (4 mg Intravenous Given 07/27/20 1502)  morphine 4 MG/ML injection 4 mg (4 mg Intravenous Given 07/27/20 1459)  dicyclomine (BENTYL) capsule 10 mg (10 mg Oral Given 07/27/20 1511)  pantoprazole (PROTONIX) injection 40 mg (40 mg Intravenous Given 07/27/20 1459)    ED Course  I have reviewed the triage vital signs and the nursing notes.  Pertinent labs & imaging results that were available during my care of the patient were reviewed by me and considered in my medical decision making (see chart for details).    MDM Rules/Calculators/A&P                         Morgan Chung is a 35 y.o. female past medical history of GERD, gastritis that presents the emergency department today for complaint of epigastric abdominal pain with nausea vomiting for the last 4 days.  Patient  has not seen her GI doctor in a while, does have an appointment in 1 month.  Is on Zofran, Bentyl, PPIs, however has not been able to take these in the past couple of days due to nausea/vomiting. No acute abdomen.  No tenderness to palpation on exam.    Work-up today unremarkable, hemoglobin is not too far from baseline.  Upon reevaluation after Bentyl, Zofran, fluids, morphine, Protonix patient states that she feels much better.  Passed p.o. challenge.  Patient to follow-up with GI doctor.  Could be gastritis, could also be gastroenteritis with addition to diarrhea since that has been pretty prevalent in this area in the last couple of weeks.  Electrolytes are normal.  Has not vomited while being here for 4-1/2 hours.  Patient asking to leave at this time, is eating crackers.  Asking for prescription of Robaxin for some muscle pain, patient will follow up with her GI doctor.  Doubt need for further emergent work up at this time. I explained the diagnosis and have given explicit precautions to return to the ER including for any other new or worsening symptoms. The patient understands and accepts the medical plan as it's been dictated and I have answered their questions. Discharge instructions concerning home care and prescriptions have been given. The patient is STABLE and is discharged to home in good condition.    Final Clinical Impression(s) / ED Diagnoses Final diagnoses:  Nausea vomiting and diarrhea    Rx / DC Orders ED Discharge Orders         Ordered    ondansetron (ZOFRAN ODT) 4 MG disintegrating tablet  Every 8 hours PRN        07/27/20 1703    methocarbamol (ROBAXIN) 500  MG tablet  2 times daily        07/27/20 1703           Farrel Gordonatel, Tillmon Kisling, PA-C 07/27/20 1710    Koleen DistanceWright, Anna G, MD 07/27/20 1800

## 2020-07-29 ENCOUNTER — Encounter (HOSPITAL_COMMUNITY): Payer: Self-pay

## 2020-07-29 ENCOUNTER — Other Ambulatory Visit: Payer: Self-pay

## 2020-07-29 DIAGNOSIS — R1013 Epigastric pain: Secondary | ICD-10-CM | POA: Insufficient documentation

## 2020-07-29 DIAGNOSIS — K219 Gastro-esophageal reflux disease without esophagitis: Secondary | ICD-10-CM | POA: Diagnosis not present

## 2020-07-29 DIAGNOSIS — Z91040 Latex allergy status: Secondary | ICD-10-CM | POA: Diagnosis not present

## 2020-07-29 DIAGNOSIS — R112 Nausea with vomiting, unspecified: Secondary | ICD-10-CM | POA: Insufficient documentation

## 2020-07-29 LAB — CBC
HCT: 34.6 % — ABNORMAL LOW (ref 36.0–46.0)
Hemoglobin: 10.1 g/dL — ABNORMAL LOW (ref 12.0–15.0)
MCH: 22 pg — ABNORMAL LOW (ref 26.0–34.0)
MCHC: 29.2 g/dL — ABNORMAL LOW (ref 30.0–36.0)
MCV: 75.2 fL — ABNORMAL LOW (ref 80.0–100.0)
Platelets: 247 10*3/uL (ref 150–400)
RBC: 4.6 MIL/uL (ref 3.87–5.11)
RDW: 17.8 % — ABNORMAL HIGH (ref 11.5–15.5)
WBC: 6.4 10*3/uL (ref 4.0–10.5)
nRBC: 0 % (ref 0.0–0.2)

## 2020-07-29 LAB — COMPREHENSIVE METABOLIC PANEL
ALT: 14 U/L (ref 0–44)
AST: 17 U/L (ref 15–41)
Albumin: 4 g/dL (ref 3.5–5.0)
Alkaline Phosphatase: 47 U/L (ref 38–126)
Anion gap: 13 (ref 5–15)
BUN: 9 mg/dL (ref 6–20)
CO2: 20 mmol/L — ABNORMAL LOW (ref 22–32)
Calcium: 9.4 mg/dL (ref 8.9–10.3)
Chloride: 106 mmol/L (ref 98–111)
Creatinine, Ser: 1.01 mg/dL — ABNORMAL HIGH (ref 0.44–1.00)
GFR, Estimated: >60 mL/min (ref >60)
Glucose, Bld: 93 mg/dL (ref 70–99)
Potassium: 3.6 mmol/L (ref 3.5–5.1)
Sodium: 139 mmol/L (ref 135–145)
Total Bilirubin: 0.6 mg/dL (ref 0.3–1.2)
Total Protein: 7.8 g/dL (ref 6.5–8.1)

## 2020-07-29 LAB — LIPASE, BLOOD: Lipase: 24 U/L (ref 11–51)

## 2020-07-29 LAB — I-STAT BETA HCG BLOOD, ED (MC, WL, AP ONLY): I-stat hCG, quantitative: <5 m[IU]/mL (ref <5)

## 2020-07-29 NOTE — ED Triage Notes (Signed)
Patient reports that she was diagnosed with gastritis and has been prescribed meds that are not working. Patient states there is a burning sensation in the mid upper abdomen. paitent also c/o nausea.

## 2020-07-30 ENCOUNTER — Encounter (HOSPITAL_COMMUNITY): Payer: Self-pay

## 2020-07-30 ENCOUNTER — Emergency Department (HOSPITAL_COMMUNITY): Payer: Medicaid Other

## 2020-07-30 ENCOUNTER — Emergency Department (HOSPITAL_COMMUNITY)
Admission: EM | Admit: 2020-07-30 | Discharge: 2020-07-30 | Disposition: A | Discharge status: Home / Self Care | Payer: Medicaid Other | Attending: Emergency Medicine | Admitting: Emergency Medicine

## 2020-07-30 DIAGNOSIS — R1010 Upper abdominal pain, unspecified: Secondary | ICD-10-CM

## 2020-07-30 HISTORY — DX: Gastritis, unspecified, without bleeding: K29.70

## 2020-07-30 LAB — URINALYSIS, ROUTINE W REFLEX MICROSCOPIC
Bacteria, UA: NONE SEEN
Glucose, UA: NEGATIVE mg/dL
Hgb urine dipstick: NEGATIVE
Ketones, ur: 5 mg/dL — AB
Leukocytes,Ua: NEGATIVE
Nitrite: NEGATIVE
Protein, ur: 30 mg/dL — AB
Specific Gravity, Urine: 1.036 — ABNORMAL HIGH (ref 1.005–1.030)
pH: 5 (ref 5.0–8.0)

## 2020-07-30 MED ORDER — ONDANSETRON HCL 4 MG/2ML IJ SOLN
4.0000 mg | Freq: Once | INTRAMUSCULAR | Status: AC
  Administered 2020-07-30: 4 mg via INTRAVENOUS
  Filled 2020-07-30: qty 2

## 2020-07-30 MED ORDER — HYDROMORPHONE HCL 1 MG/ML IJ SOLN
1.0000 mg | Freq: Once | INTRAMUSCULAR | Status: AC
  Administered 2020-07-30: 1 mg via INTRAVENOUS
  Filled 2020-07-30: qty 1

## 2020-07-30 MED ORDER — SODIUM CHLORIDE 0.9 % IV BOLUS
1000.0000 mL | Freq: Once | INTRAVENOUS | Status: AC
  Administered 2020-07-30: 1000 mL via INTRAVENOUS

## 2020-07-30 MED ORDER — IOHEXOL 300 MG/ML  SOLN
100.0000 mL | Freq: Once | INTRAMUSCULAR | Status: AC | PRN
  Administered 2020-07-30: 100 mL via INTRAVENOUS

## 2020-07-30 MED ORDER — METOCLOPRAMIDE HCL 5 MG/ML IJ SOLN
10.0000 mg | Freq: Once | INTRAMUSCULAR | Status: AC
  Administered 2020-07-30: 10 mg via INTRAVENOUS
  Filled 2020-07-30: qty 2

## 2020-07-30 MED ORDER — AMOXICILLIN-POT CLAVULANATE 875-125 MG PO TABS: 1.0000 | ORAL_TABLET | Freq: Two times a day (BID) | ORAL | 0 refills | Status: DC

## 2020-07-30 MED ORDER — HYDROCODONE-ACETAMINOPHEN 5-325 MG PO TABS: 1.0000 | ORAL_TABLET | ORAL | 0 refills | Status: DC | PRN

## 2020-07-30 NOTE — ED Provider Notes (Signed)
[facility]-EMERGENCY DEPT Provider Note   CSN: [id number] Arrival date & time: 07/29/20  1849     History Chief Complaint  Patient presents with  . Abdominal Pain  . Emesis    Morgan Chung is a 35 y.o. female.  Patient presents to the emergency department for recurrent mid upper abdominal pain with nausea and vomiting.  Patient reports that she did feel better after she was treated in the emergency department the other day but 2 nights ago pain returned.  Since then she has been taking her medications exactly as prescribed but has not had any relief.  Patient reports persistent epigastric pain and nausea preventing her from eating or drinking much.  Her gastroenterologist cannot see her until May.        Past Medical History:  Diagnosis Date  . Gastritis   . GERD (gastroesophageal reflux disease)   . Insomnia     There are no problems to display for this patient.   Past Surgical History:  Procedure Laterality Date  . CESAREAN SECTION       OB History   No obstetric history on file.     Family History  Problem Relation Age of Onset  . Hypertension Mother   . Irritable bowel syndrome Mother   . Diabetes Father   . Hypertension Father   . Irritable bowel syndrome Sister   . Colon cancer Neg Hx   . Pancreatic cancer Neg Hx   . Stomach cancer Neg Hx   . Esophageal cancer Neg Hx   . Liver cancer Neg Hx     Social History   Tobacco Use  . Smoking status: Never Smoker  . Smokeless tobacco: Never Used  Vaping Use  . Vaping Use: Never used  Substance Use Topics  . Alcohol use: Yes    Comment: occasionally  . Drug use: Never    Home Medications Prior to Admission medications   Medication Sig Start Date End Date Taking? Authorizing Provider  amoxicillin-clavulanate (AUGMENTIN) 875-125 MG tablet Take 1 tablet by mouth every 12 (twelve) hours. 07/30/20  Yes Carmin Alvidrez, Canary Brim, MD  cimetidine (TAGAMET) 200 MG tablet Take 200 mg by  mouth 2 (two) times daily.   Yes [provider]  dicyclomine (BENTYL) 20 MG tablet Take 1 tablet (20 mg total) by mouth every 8 (eight) hours as needed for spasms. 01/10/20  Yes Nandigam, Eleonore Chiquito, MD  HYDROcodone-acetaminophen (NORCO/VICODIN) 5-325 MG tablet Take 1 tablet by mouth every 4 (four) hours as needed for moderate pain. 07/30/20  Yes Jaslen Adcox, Canary Brim, MD  methocarbamol (ROBAXIN) 500 MG tablet Take 1 tablet (500 mg total) by mouth 2 (two) times daily. 07/27/20  Yes Farrel Gordon, PA-C  omeprazole (PRILOSEC) 40 MG capsule Take 1 capsule (40 mg total) by mouth daily. 01/10/20  Yes Nandigam, Eleonore Chiquito, MD  ondansetron (ZOFRAN ODT) 4 MG disintegrating tablet Take 1 tablet (4 mg total) by mouth every 8 (eight) hours as needed for nausea or vomiting. 07/27/20  Yes Farrel Gordon, PA-C  PROAIR HFA 108 [phone] Base) MCG/ACT inhaler Inhale 1 puff into the lungs every 4 (four) hours as needed for wheezing or shortness of breath. 06/11/20  Yes [provider]  sucralfate (CARAFATE) 1 g tablet Take 1 g by mouth 4 (four) times daily. 07/27/20  Yes [provider]  zolpidem (AMBIEN) 10 MG tablet Take 10 mg by mouth at bedtime as needed. 01/09/20  Yes [provider]  ARIPiprazole (ABILIFY) 2 MG tablet  Take 2 mg by mouth daily. Patient not taking: Reported on 07/30/2020 01/09/20   [provider]    Allergies    Latex  Review of Systems   Review of Systems  Gastrointestinal: Positive for abdominal pain, nausea and vomiting.  All other systems reviewed and are negative.   Physical Exam Updated Vital Signs BP 114/76   Pulse 77   Temp 98.1 F (36.7 C) (Oral)   Resp 17   Ht 5\' 5"  (1.651 m)   Wt 93.4 kg   LMP 07/13/2020 Comment: negative beta HCG 07/29/20  SpO2 95%   BMI 34.28 kg/m   Physical Exam Vitals and nursing note reviewed.  Constitutional:      General: She is not in acute distress.    Appearance: Normal appearance. She is well-developed.   HENT:     Head: Normocephalic and atraumatic.     Right Ear: Hearing normal.     Left Ear: Hearing normal.     Nose: Nose normal.  Eyes:     Conjunctiva/sclera: Conjunctivae normal.     Pupils: Pupils are equal, round, and reactive to light.  Cardiovascular:     Rate and Rhythm: Regular rhythm.     Heart sounds: S1 normal and S2 normal. No murmur heard. No friction rub. No gallop.   Pulmonary:     Effort: Pulmonary effort is normal. No respiratory distress.     Breath sounds: Normal breath sounds.  Chest:     Chest wall: No tenderness.  Abdominal:     General: Bowel sounds are normal.     Palpations: Abdomen is soft.     Tenderness: There is abdominal tenderness in the epigastric area. There is no guarding or rebound. Negative signs include Murphy's sign and McBurney's sign.     Hernia: No hernia is present.  Musculoskeletal:        General: Normal range of motion.     Cervical back: Normal range of motion and neck supple.  Skin:    General: Skin is warm and dry.     Findings: No rash.  Neurological:     Mental Status: She is alert and oriented to person, place, and time.     GCS: GCS eye subscore is 4. GCS verbal subscore is 5. GCS motor subscore is 6.     Cranial Nerves: No cranial nerve deficit.     Sensory: No sensory deficit.     Coordination: Coordination normal.  Psychiatric:        Speech: Speech normal.        Behavior: Behavior normal.        Thought Content: Thought content normal.     ED Results / Procedures / Treatments   Labs (all labs ordered are listed, but only abnormal results are displayed) Labs Reviewed  COMPREHENSIVE METABOLIC PANEL - Abnormal; Notable for the following components:      Result Value   CO2 20 (*)    Creatinine, Ser 1.01 (*)    All other components within normal limits  CBC - Abnormal; Notable for the following components:   Hemoglobin 10.1 (*)    HCT 34.6 (*)    MCV 75.2 (*)    MCH 22.0 (*)    MCHC 29.2 (*)    RDW 17.8 (*)     All other components within normal limits  URINALYSIS, ROUTINE W REFLEX MICROSCOPIC - Abnormal; Notable for the following components:   Color, Urine AMBER (*)    APPearance HAZY (*)  Specific Gravity, Urine 1.036 (*)    Bilirubin Urine MODERATE (*)    Ketones, ur 5 (*)    Protein, ur 30 (*)    All other components within normal limits  LIPASE, BLOOD  I-STAT BETA HCG BLOOD, ED ([facility], [facility], [facility] ONLY)    EKG None  Radiology CT ABDOMEN PELVIS W CONTRAST  Result Date: 07/30/2020 CLINICAL DATA:  Nausea, burning sensation upper abdomen, history of gastritis EXAM: CT ABDOMEN AND PELVIS WITH CONTRAST TECHNIQUE: Multidetector CT imaging of the abdomen and pelvis was performed using the standard protocol following bolus administration of intravenous contrast. CONTRAST:  OMNIPAQUE IOHEXOL 300 MG/ML  SOLN COMPARISON:  None. FINDINGS: Lower chest: No acute pleural or parenchymal lung disease. Hepatobiliary: No focal liver abnormality is seen. No gallstones, gallbladder wall thickening, or biliary dilatation. Pancreas: Unremarkable. No pancreatic ductal dilatation or surrounding inflammatory changes. Spleen: Normal in size without focal abnormality. Adrenals/Urinary Tract: Adrenal glands are unremarkable. Kidneys are normal, without renal calculi, focal lesion, or hydronephrosis. Bladder is unremarkable. Stomach/Bowel: No bowel obstruction or ileus. Normal appendix right lower quadrant. There is marked circumferential wall thickening of the terminal ileum, with surrounding fat stranding, most consistent with inflammatory bowel disease such as Crohn disease. No other bowel wall thickening. Vascular/Lymphatic: No significant vascular abnormalities. Right lower quadrant lymphadenopathy is identified, largest lymph node measuring 12 mm in short axis. Reproductive: Uterus and bilateral adnexa are unremarkable. Other: Trace pelvic free fluid. No free intraperitoneal gas. No abdominal wall hernia.  Musculoskeletal: No acute or destructive bony lesions. Reconstructed images demonstrate no additional findings. IMPRESSION: 1. Circumferential bowel wall thickening involving the terminal ileum, with surrounding inflammatory changes and reactive adenopathy. Overall, findings are most consistent with inflammatory bowel disease such as Crohn disease in a patient of this age. Infectious ileitis thought to be less likely. 2. Trace pelvic free fluid, likely reactive. 3. Normal appendix. Electronically Signed   By: Sharlet Salina M.D.   On: 07/30/2020 03:43    Procedures Procedures   Medications Ordered in ED Medications  sodium chloride 0.9 % bolus 1,000 mL (0 mLs Intravenous Stopped 07/30/20 0449)  ondansetron (ZOFRAN) injection 4 mg (4 mg Intravenous Given 07/30/20 0244)  HYDROmorphone (DILAUDID) injection 1 mg (1 mg Intravenous Given 07/30/20 0245)  metoCLOPramide (REGLAN) injection 10 mg (10 mg Intravenous Given 07/30/20 0244)  iohexol (OMNIPAQUE) 300 MG/ML solution 100 mL (100 mLs Intravenous Contrast Given 07/30/20 0321)    ED Course  I have reviewed the triage vital signs and the nursing notes.  Pertinent labs & imaging results that were available during my care of the patient were reviewed by me and considered in my medical decision making (see chart for details).    MDM Rules/Calculators/A&P                          Patient presents to the emergency department with complaints of abdominal pain, nausea and vomiting.  Pain across the midportion of the abdomen.  No guarding or rebound noted.  Lab work reassuring.  Vital signs are unremarkable, no fever.  Patient was seen several days ago and treated for gastritis, as she has a history of gastritis, proven by E[place].  She has tried to get into see her gastroenterologist, but no appointment until May.  Symptoms returned 2 days ago and have been persistent.  Because of her recurrent symptoms, CT scan was performed.   CT scan with inflammatory changes  of the terminal ileum, cannot rule out inflammatory bowel disease.  Patient feeling much better after treatment.  Will empirically provide Augmentin and analgesia.  Follow-up with gastroenterology.  Return if symptoms worsen.  Final Clinical Impression(s) / ED Diagnoses Final diagnoses:  Pain of upper abdomen    Rx / DC Orders ED Discharge Orders         Ordered    HYDROcodone-acetaminophen (NORCO/VICODIN) 5-325 MG tablet  Every 4 hours PRN        07/30/20 0424    amoxicillin-clavulanate (AUGMENTIN) 875-125 MG tablet  Every 12 hours        07/30/20 0424           Gilda CreasePollina, Danniell Rotundo J, MD 07/30/20 [phone]

## 2020-07-31 ENCOUNTER — Telehealth: Payer: Self-pay

## 2020-07-31 NOTE — Telephone Encounter (Signed)
-----   Message from Napoleon Form, MD sent at 07/30/2020  1:24 PM EDT ----- Regarding: RE: patient follow-up We will try to get her in soon. Thank you for letting me know. Taevon Aschoff, Can we please bring her in soon with me or APP. She will likely need colonoscopy with biopsies for evaluation.  Thanks ----- Message ----- From: Gilda Crease, MD Sent: 07/30/2020   4:23 AM EDT To: Napoleon Form, MD Subject: patient follow-up                              Morgan Chung has been to urgent care once and in the ER twice this week for abdominal pain with nausea and vomiting.  She is an established patient of yours.  [place].  In the past she has been treated for the symptoms for gastritis that was seen on EGD.  Today, however, she reports that the symptoms feel different.  I did perform a CT scan which shows inflammatory changes around the terminal ileum concerning for possible Crohn's disease.  She has been trying to get an appointment with you this week but your staff has told her that she cannot be seen before May.  Can you possibly contact her with an earlier appointment? Thanks,   Thayer Ohm

## 2020-07-31 NOTE — Telephone Encounter (Signed)
Patient is scheduled for 08/02/20 with Hyacinth Meeker, PA

## 2020-08-02 ENCOUNTER — Ambulatory Visit: Payer: Medicaid Other | Admitting: Physician Assistant

## 2020-08-02 ENCOUNTER — Encounter: Payer: Self-pay | Admitting: Physician Assistant

## 2020-08-02 ENCOUNTER — Other Ambulatory Visit: Payer: Self-pay

## 2020-08-02 VITALS — BP 122/76 | HR 88 | Ht 65.0 in | Wt 201.4 lb

## 2020-08-02 DIAGNOSIS — R9389 Abnormal findings on diagnostic imaging of other specified body structures: Secondary | ICD-10-CM

## 2020-08-02 DIAGNOSIS — R1013 Epigastric pain: Secondary | ICD-10-CM

## 2020-08-02 MED ORDER — CLENPIQ 10-3.5-12 MG-GM -GM/160ML PO SOLN: ORAL | 0 refills | Status: DC

## 2020-08-02 NOTE — Patient Instructions (Signed)
Continue antibiotics as directed.   We have sent the following medications to your pharmacy for you to pick up at your convenience: hydrocodone.  You have been scheduled for a colonoscopy. Please follow written instructions given to you at your visit today.  Please pick up your prep supplies at the pharmacy within the next 1-3 days. If you use inhalers (even only as needed), please bring them with you on the day of your procedure.   Normal BMI (Body Mass Index- based on height and weight) is between 19 and 25. Your BMI today is Body mass index is 33.51 kg/m. Marland Kitchen Please consider follow up  regarding your BMI with your Primary Care Provider.

## 2020-08-02 NOTE — Progress Notes (Signed)
Chief Complaint: Follow-up ED visits for abdominal pain  HPI:    Morgan Chung is a 35 year old African-American female, known to Dr. Lavon Paganini, with a past medical history as listed below including gastritis and GERD, who was referred to me by Morgan Able, MD for a complaint of abdominal pain.    02/24/2019 EGD with gastritis and otherwise normal.  Pathology showed mild chronic gastritis.    01/10/2020 patient had a office visit with Dr. Lavon Paganini and complained of persistent nausea and intermittent vomiting with epigastric pain.  Worse when she would eat a heavy meal.  She is only taking omeprazole intermittently.  At that time ordered an abdominal ultrasound to exclude gallbladder disease and started on omeprazole 40 mg daily.  Also Zofran as needed and dicyclomine every 8 hours as needed.  Ultrasound was never completed.    07/27/2020 seen in the ED for abdominal pain and vomiting.  At that time labs showed a CBC with hemoglobin 11.1, CMP mostly unremarkable, lipase normal.  At that time patient felt better after Bentyl, Zofran, fluids, morphine and Protonix.  She was prescribed Zofran and Robaxin and discharged.    07/30/2020 patient return to the ED with recurrent mid upper abdominal pain with nausea and vomiting.  Labs at that time showed a hemoglobin of 10.1 and otherwise normal CMP and lipase.  CT of the abdomen pelvis showed circumferential bowel wall thickening involving the terminal ileum with surrounding inflammatory changes and reactive adenopathy.  Overall findings were more consistent with inflammatory bowel disease such as Crohn's disease, trace pelvic free fluid, likely reactive and normal appendix.  She was given hydrocodone and Augmentin.    08/01/2020 patient seen in the urgent care on [address] for continued abdominal pain.  She had an abdominal x-ray which showed calcific densities in the left middle quadrant of uncertain significance, possibly external to the patient, she was  told she had Crohn's disease and her Augmentin was stopped.  She was started on metronidazole 500 twice daily and Cipro 500 twice daily as well as sulfasalazine.  She was also given an injection of Methylprednisolone.    Today, the patient tells me that this epigastric pain which she has been feeling is the same that she had 2 years ago when she was evaluated by Dr. Lavon Paganini and diagnosed with gastritis.  Tells me that every time she has had this pain it seems to start after she has drank wine the night before.  Most recently this was severe 10/10 sharp pain that made her miss work over the past week with multiple ER visits and urgent care visits as above.  She was at one point given Augmentin and took 3 days of this with no change in her symptoms, most recently started on Cipro and Flagyl yesterday and tells me that she received a shot of what we find out is methylprednisolone and this is the only thing that seemed to help her symptoms at all.  The pain has started to come back this morning and she describes having to use some Hydrocodone to circumvent this getting severe.  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  She was told to stop her Dicyclomine last night, previous to this had been using 3 times a day and it would sometimes take the edge off of this cramping pain.  Associated symptoms include nausea and vomiting, interestingly no change to bowel habits at all.  She reports regular solid stools.  Denies fever, chills or weight loss.  Past Medical History:  Diagnosis Date  . Gastritis   . GERD (gastroesophageal reflux disease)   . Insomnia     Past Surgical History:  Procedure Laterality Date  . CESAREAN SECTION      Current Outpatient Medications  Medication Sig Dispense Refill  . amoxicillin-clavulanate (AUGMENTIN) 875-125 MG tablet Take 1 tablet by mouth every 12 (twelve) hours. 14 tablet 0  . cimetidine (TAGAMET) 200 MG tablet Take 200 mg by mouth 2  (two) times daily.    . ciprofloxacin (CIPRO) 500 MG tablet Take 500 mg by mouth 2 (two) times daily.    Marland Kitchen dicyclomine (BENTYL) 20 MG tablet Take 1 tablet (20 mg total) by mouth every 8 (eight) hours as needed for spasms. 90 tablet 3  . HYDROcodone-acetaminophen (NORCO/VICODIN) 5-325 MG tablet Take 1 tablet by mouth every 4 (four) hours as needed for moderate pain. 10 tablet 0  . methocarbamol (ROBAXIN) 500 MG tablet Take 1 tablet (500 mg total) by mouth 2 (two) times daily. 20 tablet 0  . metroNIDAZOLE (FLAGYL) 500 MG tablet Take 500 mg by mouth 2 (two) times daily.    Marland Kitchen omeprazole (PRILOSEC) 40 MG capsule Take 1 capsule (40 mg total) by mouth daily. 90 capsule 3  . ondansetron (ZOFRAN ODT) 4 MG disintegrating tablet Take 1 tablet (4 mg total) by mouth every 8 (eight) hours as needed for nausea or vomiting. 20 tablet 0  . PROAIR HFA 108 (90 Base) MCG/ACT inhaler Inhale 1 puff into the lungs every 4 (four) hours as needed for wheezing or shortness of breath.    . promethazine (PHENERGAN) 25 MG tablet Take 25-50 mg by mouth every 6 (six) hours as needed.    . Sod Picosulfate-Mag Ox-Cit Acd (CLENPIQ) 10-3.5-12 MG-GM -GM/160ML SOLN Take as directed 320 mL 0  . sucralfate (CARAFATE) 1 g tablet Take 1 g by mouth 4 (four) times daily.    Marland Kitchen sulfaSALAzine (AZULFIDINE) 500 MG tablet Take 1,000 mg by mouth 2 (two) times daily.    Marland Kitchen zolpidem (AMBIEN) 10 MG tablet Take 10 mg by mouth at bedtime as needed.     No current facility-administered medications for this visit.    Allergies as of 08/02/2020 - Review Complete 08/02/2020  Allergen Reaction Noted  . Latex  01/27/2019    Family History  Problem Relation Age of Onset  . Hypertension Mother   . Irritable bowel syndrome Mother   . Diabetes Father   . Hypertension Father   . Irritable bowel syndrome Sister   . Colon cancer Neg Hx   . Pancreatic cancer Neg Hx   . Stomach cancer Neg Hx   . Esophageal cancer Neg Hx   . Liver cancer Neg Hx      Social History   Socioeconomic History  . Marital status: Single    Spouse name: Not on file  . Number of children: Not on file  . Years of education: Not on file  . Highest education level: Not on file  Occupational History  . Not on file  Tobacco Use  . Smoking status: Never Smoker  . Smokeless tobacco: Never Used  Vaping Use  . Vaping Use: Never used  Substance and Sexual Activity  . Alcohol use: Yes    Comment: occasionally  . Drug use: Never  . Sexual activity: Yes    Partners: Male  Other Topics Concern  . Not on file  Social History Narrative  . Not on  file   Social Determinants of Health   Financial Resource Strain: Not on file  Food Insecurity: Not on file  Transportation Needs: Not on file  Physical Activity: Not on file  Stress: Not on file  Social Connections: Not on file  Intimate Partner Violence: Not on file    Review of Systems:    Constitutional: No weight loss, fever or chills Cardiovascular: No chest pain Respiratory: No SOB  Gastrointestinal: See HPI and otherwise negative   Physical Exam:  Vital signs: BP 122/76 (BP Location: Left Arm, Patient Position: Sitting, Cuff Size: Large)   Pulse 88   Ht 5\' 5"  (1.651 m)   Wt 201 lb 6 oz (91.3 kg)   LMP 07/13/2020 Comment: negative beta HCG 07/29/20  BMI 33.51 kg/m   Constitutional:   Pleasant AA female appears to be in NAD, Well developed, Well nourished, alert and cooperative Head:  Normocephalic and atraumatic. Eyes:   PEERL, EOMI. No icterus. Conjunctiva pink. Ears:  Normal auditory acuity. Neck:  Supple Throat: Oral cavity and pharynx without inflammation, swelling or lesion.  Respiratory: Respirations even and unlabored. Lungs clear to auscultation bilaterally.   No wheezes, crackles, or rhonchi.  Cardiovascular: Normal S1, S2. No MRG. Regular rate and rhythm. No peripheral edema, cyanosis or pallor.  Gastrointestinal:  Soft, nondistended, mild epigastric ttp No rebound or guarding.  Normal bowel sounds. No appreciable masses or hepatomegaly. Rectal:  Not performed.  Msk:  Symmetrical without gross deformities. Without edema, no deformity or joint abnormality.  Neurologic:  Alert and  oriented x4;  grossly normal neurologically.  Skin:   Dry and intact without significant lesions or rashes. Psychiatric: Demonstrates good judgement and reason without abnormal affect or behaviors.  RELEVANT LABS AND IMAGING: CBC    Component Value Date/Time   WBC 6.4 07/29/2020 1926   RBC 4.60 07/29/2020 1926   HGB 10.1 (L) 07/29/2020 1926   HCT 34.6 (L) 07/29/2020 1926   PLT 247 07/29/2020 1926   MCV 75.2 (L) 07/29/2020 1926   MCH 22.0 (L) 07/29/2020 1926   MCHC 29.2 (L) 07/29/2020 1926   RDW 17.8 (H) 07/29/2020 1926    CMP     Component Value Date/Time   NA 139 07/29/2020 1926   K 3.6 07/29/2020 1926   CL 106 07/29/2020 1926   CO2 20 (L) 07/29/2020 1926   GLUCOSE 93 07/29/2020 1926   BUN 9 07/29/2020 1926   CREATININE 1.01 (H) 07/29/2020 1926   CALCIUM 9.4 07/29/2020 1926   PROT 7.8 07/29/2020 1926   ALBUMIN 4.0 07/29/2020 1926   AST 17 07/29/2020 1926   ALT 14 07/29/2020 1926   ALKPHOS 47 07/29/2020 1926   BILITOT 0.6 07/29/2020 1926   GFRNONAA >60 07/29/2020 1926   GFRAA >60 01/27/2019 1538    Assessment: 1.  Epigastric pain: History of gastritis in 2020 on EGD, now CT showing Crohn's and her only relief was from a Methylprednisolone injection in the urgent care yesterday; consider Crohn's 2.  Abnormal CT of the colon: Showing inflammation thought consistent with Crohn's, patient with no change in bowel habits but experiencing epigastric pain, EGD in 2020 showing gastritis; consider Crohn's versus other  Plan: 1.  Reviewed all of patient's recent lab work and imaging, discussed with her that her symptoms are somewhat inconsistent with a typical diagnosis of UC/Crohn's given that her bowel movements have been completely normal, but with imaging showing  inflammation would recommend that she have a colonoscopy.  Did discuss that typically we would  like to wait at least 4 to 6 weeks after being on antibiotics to proceed with colonoscopy, but patient has had no change with antibiotics over the past 4 days and only had relief with a steroid injection given yesterday at the urgent care.  That makes suspicion higher for IBD and I believe patient would benefit from having a sooner colonoscopy to evaluate this. 2.  Colonoscopy was scheduled with Dr. [name] as Dr. Lavon Paganini did not have availability within the next week.  Patient was provided a detailed list of risks for procedure and she agrees to proceed.  She will continue to follow Dr. Lavon Paganini as her primary GI physician after time of procedure. 3.  For now recommend the patient continue her Cipro and Flagyl as prescribed. 4.  Refilled patient's Hydrocodone prescription to get her through the weekend.  Did explain that if pain/symptoms increase or worsen would recommend she proceed to the ER and likely be admitted so that we can get this work-up done sooner.  She verbalized understanding. 5.  Patient to follow in clinic per recommendations after colonoscopy above.  Hyacinth Meeker, PA-C [facility] 08/02/2020, 3:28 PM  Cc: Morgan Able, MD

## 2020-08-06 ENCOUNTER — Other Ambulatory Visit: Payer: Self-pay

## 2020-08-06 ENCOUNTER — Ambulatory Visit (AMBULATORY_SURGERY_CENTER): Payer: Medicaid Other | Admitting: Gastroenterology

## 2020-08-06 ENCOUNTER — Encounter: Payer: Self-pay | Admitting: Gastroenterology

## 2020-08-06 VITALS — BP 123/76 | HR 71 | Temp 97.3°F | Resp 22 | Ht 65.0 in | Wt 201.0 lb

## 2020-08-06 DIAGNOSIS — K5000 Crohn's disease of small intestine without complications: Secondary | ICD-10-CM

## 2020-08-06 DIAGNOSIS — K50118 Crohn's disease of large intestine with other complication: Secondary | ICD-10-CM | POA: Diagnosis not present

## 2020-08-06 DIAGNOSIS — R9389 Abnormal findings on diagnostic imaging of other specified body structures: Secondary | ICD-10-CM

## 2020-08-06 MED ORDER — SODIUM CHLORIDE 0.9 % IV SOLN: 500.0000 mL | Freq: Once | INTRAVENOUS | Status: DC

## 2020-08-06 NOTE — Patient Instructions (Signed)
Please read handouts provided. Continue present medications. Await pathology results. Follow up with Hyacinth Meeker next week. No aspirin, ibuprofen, naproxen, or other non-steriodal anti-inflammatory drugs.     YOU HAD AN ENDOSCOPIC PROCEDURE TODAY AT THE [facility]:   Refer to the procedure report that was given to you for any specific questions about what was found during the examination.  If the procedure report does not answer your questions, please call your gastroenterologist to clarify.  If you requested that your care partner not be given the details of your procedure findings, then the procedure report has been included in a sealed envelope for you to review at your convenience later.  YOU SHOULD EXPECT: Some feelings of bloating in the abdomen. Passage of more gas than usual.  Walking can help get rid of the air that was put into your GI tract during the procedure and reduce the bloating. If you had a lower endoscopy (such as a colonoscopy or flexible sigmoidoscopy) you may notice spotting of blood in your stool or on the toilet paper. If you underwent a bowel prep for your procedure, you may not have a normal bowel movement for a few days.  Please Note:  You might notice some irritation and congestion in your nose or some drainage.  This is from the oxygen used during your procedure.  There is no need for concern and it should clear up in a day or so.  SYMPTOMS TO REPORT IMMEDIATELY:   Following lower endoscopy (colonoscopy or flexible sigmoidoscopy):  Excessive amounts of blood in the stool  Significant tenderness or worsening of abdominal pains  Swelling of the abdomen that is new, acute  Fever of 100F or higher    For urgent or emergent issues, a gastroenterologist can be reached at any hour by calling [phone]. Do not use MyChart messaging for urgent concerns.    DIET:  We do recommend a small meal at first, but then you may proceed to your regular  diet.  Drink plenty of fluids but you should avoid alcoholic beverages for 24 hours.  ACTIVITY:  You should plan to take it easy for the rest of today and you should NOT DRIVE or use heavy machinery until tomorrow (because of the sedation medicines used during the test).    FOLLOW UP: Our staff will call the number listed on your records 48-72 hours following your procedure to check on you and address any questions or concerns that you may have regarding the information given to you following your procedure. If we do not reach you, we will leave a message.  We will attempt to reach you two times.  During this call, we will ask if you have developed any symptoms of COVID 19. If you develop any symptoms (ie: fever, flu-like symptoms, shortness of breath, cough etc.) before then, please call [phone].  If you test positive for Covid 19 in the 2 weeks post procedure, please call and report this information to [place].    If any biopsies were taken you will be contacted by phone or by letter within the next 1-3 weeks.  Please call us at [phone] if you have not heard about the biopsies in 3 weeks.    SIGNATURES/CONFIDENTIALITY: You and/or your care partner have signed paperwork which will be entered into your electronic medical record.  These signatures attest to the fact that that the information above on your After Visit Summary has been reviewed and is understood.  Full responsibility  of the confidentiality of this discharge information lies with you and/or your care-partner.

## 2020-08-06 NOTE — Op Note (Addendum)
[facility] Patient Name: Morgan Chung Procedure Date: 08/06/2020 4:01 PM MRN: [id number] Endoscopist: Lynann Bologna , MD Age: 35 Referring MD:  Date of Birth: April 10, 1986 Gender: Female Account #: [id number] Procedure:                Colonoscopy Indications:              Abnormal CT of the GI tract. No diarrhea. Medicines:                Monitored Anesthesia Care Procedure:                Pre-Anesthesia Assessment:                           - Prior to the procedure, a History and Physical                            was performed, and patient medications and                            allergies were reviewed. The patient's tolerance of                            previous anesthesia was also reviewed. The risks                            and benefits of the procedure and the sedation                            options and risks were discussed with the patient.                            All questions were answered, and informed consent                            was obtained. Prior Anticoagulants: The patient has                            taken no previous anticoagulant or antiplatelet                            agents. ASA Grade Assessment: II - A patient with                            mild systemic disease. After reviewing the risks                            and benefits, the patient was deemed in                            satisfactory condition to undergo the procedure.                           After obtaining informed consent, the colonoscope  was passed under direct vision. Throughout the                            procedure, the patient's blood pressure, pulse, and                            oxygen saturations were monitored continuously. The                            Olympus PCF-H190DL [id number]) Colonoscope was                            introduced through the anus and advanced to the 1                            cm into the ileum. The  colonoscopy was performed                            without difficulty. The patient tolerated the                            procedure well. The quality of the bowel                            preparation was good. The terminal ileum, ileocecal                            valve, appendiceal orifice, and rectum were                            photographed. Scope In: 4:21:20 PM Scope Out: 4:45:54 PM Scope Withdrawal Time: 0 hours 20 minutes 2 seconds  Total Procedure Duration: 0 hours 24 minutes 34 seconds  Findings:                 The terminal ileum contained a benign-appearing,                            intrinsic severe stenosis measuring 6 mm (inner                            diameter) that was non-traversed with PCF H190                            (peds colonoscope). The stricture appeared to be                            predominantly fibrotic with a small ulceration. IC                            valve was deformed. Multiple biopsies were taken                            with a cold forceps for histology (Jar 1).  The colon (entire examined portion) appeared normal                            with well preserved vascular pattern including                            cecum. Biopsies were taken with a cold forceps for                            histology from cecum, R colon (jar 2) and L colon                            (jar 3)                           The perianal and digital rectal examinations were                            normal.                           The exam was otherwise without abnormality on                            direct and retroflexion views. Complications:            No immediate complications. Estimated Blood Loss:     Estimated blood loss: none. 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                            Biopsied.                            - No colonic or peri-anal involvement. Recommendation:           - Patient has a contact number available for                            emergencies. The signs and symptoms of potential                            delayed complications were discussed with the                            patient. Return to normal activities tomorrow.                            Written discharge instructions were provided to the                            patient.                           -  Resume previous diet.                           - Continue present medications.                           - Await pathology results.                           - Will discuss further course of action with Dr                            Heidi Dach (CTE, then Early Sx vs                            medical Rx for remission).                           - FU with Hyacinth Meeker next week.                           - No aspirin, ibuprofen, naproxen, or other                            non-steroidal anti-inflammatory drugs.                           - The findings and recommendations were discussed                            with the patient's family. Lynann Bologna, MD 08/06/2020 5:04:10 PM This report has been signed electronically.

## 2020-08-06 NOTE — Progress Notes (Signed)
PT taken to PACU. Monitors in place. VSS. Report given to RN. 

## 2020-08-07 ENCOUNTER — Other Ambulatory Visit: Payer: Self-pay

## 2020-08-07 ENCOUNTER — Inpatient Hospital Stay (HOSPITAL_COMMUNITY)
Admission: EM | Admit: 2020-08-07 | Discharge: 2020-08-14 | DRG: 387 | Disposition: A | Discharge status: Home / Self Care | Payer: Medicaid Other | Attending: Student | Admitting: Student

## 2020-08-07 ENCOUNTER — Emergency Department (HOSPITAL_COMMUNITY): Payer: Medicaid Other

## 2020-08-07 ENCOUNTER — Telehealth: Payer: Self-pay

## 2020-08-07 ENCOUNTER — Encounter (HOSPITAL_COMMUNITY): Payer: Self-pay

## 2020-08-07 DIAGNOSIS — R1013 Epigastric pain: Secondary | ICD-10-CM | POA: Diagnosis not present

## 2020-08-07 DIAGNOSIS — G4700 Insomnia, unspecified: Secondary | ICD-10-CM | POA: Diagnosis present

## 2020-08-07 DIAGNOSIS — R109 Unspecified abdominal pain: Secondary | ICD-10-CM | POA: Diagnosis not present

## 2020-08-07 DIAGNOSIS — K5010 Crohn's disease of large intestine without complications: Secondary | ICD-10-CM | POA: Diagnosis present

## 2020-08-07 DIAGNOSIS — R030 Elevated blood-pressure reading, without diagnosis of hypertension: Secondary | ICD-10-CM | POA: Diagnosis present

## 2020-08-07 DIAGNOSIS — J45909 Unspecified asthma, uncomplicated: Secondary | ICD-10-CM | POA: Diagnosis present

## 2020-08-07 DIAGNOSIS — D509 Iron deficiency anemia, unspecified: Secondary | ICD-10-CM | POA: Diagnosis present

## 2020-08-07 DIAGNOSIS — Z91040 Latex allergy status: Secondary | ICD-10-CM

## 2020-08-07 DIAGNOSIS — Z6834 Body mass index (BMI) 34.0-34.9, adult: Secondary | ICD-10-CM | POA: Diagnosis not present

## 2020-08-07 DIAGNOSIS — K50019 Crohn's disease of small intestine with unspecified complications: Secondary | ICD-10-CM | POA: Diagnosis not present

## 2020-08-07 DIAGNOSIS — Z833 Family history of diabetes mellitus: Secondary | ICD-10-CM

## 2020-08-07 DIAGNOSIS — K219 Gastro-esophageal reflux disease without esophagitis: Secondary | ICD-10-CM | POA: Diagnosis present

## 2020-08-07 DIAGNOSIS — K2970 Gastritis, unspecified, without bleeding: Secondary | ICD-10-CM | POA: Diagnosis present

## 2020-08-07 DIAGNOSIS — K2950 Unspecified chronic gastritis without bleeding: Secondary | ICD-10-CM | POA: Diagnosis not present

## 2020-08-07 DIAGNOSIS — K50018 Crohn's disease of small intestine with other complication: Secondary | ICD-10-CM | POA: Diagnosis present

## 2020-08-07 DIAGNOSIS — Z8249 Family history of ischemic heart disease and other diseases of the circulatory system: Secondary | ICD-10-CM

## 2020-08-07 DIAGNOSIS — E669 Obesity, unspecified: Secondary | ICD-10-CM | POA: Diagnosis present

## 2020-08-07 DIAGNOSIS — Z20822 Contact with and (suspected) exposure to covid-19: Secondary | ICD-10-CM | POA: Diagnosis present

## 2020-08-07 LAB — CBC WITH DIFFERENTIAL/PLATELET
Abs Immature Granulocytes: 0.02 10*3/uL (ref 0.00–0.07)
Basophils Absolute: 0 10*3/uL (ref 0.0–0.1)
Basophils Relative: 0 %
Eosinophils Absolute: 0.1 10*3/uL (ref 0.0–0.5)
Eosinophils Relative: 2 %
HCT: 35.5 % — ABNORMAL LOW (ref 36.0–46.0)
Hemoglobin: 10.4 g/dL — ABNORMAL LOW (ref 12.0–15.0)
Immature Granulocytes: 0 %
Lymphocytes Relative: 28 %
Lymphs Abs: 1.4 10*3/uL (ref 0.7–4.0)
MCH: 21.9 pg — ABNORMAL LOW (ref 26.0–34.0)
MCHC: 29.3 g/dL — ABNORMAL LOW (ref 30.0–36.0)
MCV: 74.9 fL — ABNORMAL LOW (ref 80.0–100.0)
Monocytes Absolute: 0.2 10*3/uL (ref 0.1–1.0)
Monocytes Relative: 5 %
Neutro Abs: 3.4 10*3/uL (ref 1.7–7.7)
Neutrophils Relative %: 65 %
Platelets: 228 10*3/uL (ref 150–400)
RBC: 4.74 MIL/uL (ref 3.87–5.11)
RDW: 17.8 % — ABNORMAL HIGH (ref 11.5–15.5)
WBC: 5.1 10*3/uL (ref 4.0–10.5)
nRBC: 0 % (ref 0.0–0.2)

## 2020-08-07 LAB — URINALYSIS, ROUTINE W REFLEX MICROSCOPIC
Bilirubin Urine: NEGATIVE
Glucose, UA: NEGATIVE mg/dL
Hgb urine dipstick: NEGATIVE
Ketones, ur: 5 mg/dL — AB
Leukocytes,Ua: NEGATIVE
Nitrite: NEGATIVE
Protein, ur: NEGATIVE mg/dL
Specific Gravity, Urine: 1.009 (ref 1.005–1.030)
pH: 7 (ref 5.0–8.0)

## 2020-08-07 LAB — COMPREHENSIVE METABOLIC PANEL
ALT: 14 U/L (ref 0–44)
AST: 15 U/L (ref 15–41)
Albumin: 3.8 g/dL (ref 3.5–5.0)
Alkaline Phosphatase: 48 U/L (ref 38–126)
Anion gap: 8 (ref 5–15)
BUN: 7 mg/dL (ref 6–20)
CO2: 21 mmol/L — ABNORMAL LOW (ref 22–32)
Calcium: 8.7 mg/dL — ABNORMAL LOW (ref 8.9–10.3)
Chloride: 108 mmol/L (ref 98–111)
Creatinine, Ser: 1.05 mg/dL — ABNORMAL HIGH (ref 0.44–1.00)
GFR, Estimated: >60 mL/min (ref >60)
Glucose, Bld: 101 mg/dL — ABNORMAL HIGH (ref 70–99)
Potassium: 3.6 mmol/L (ref 3.5–5.1)
Sodium: 137 mmol/L (ref 135–145)
Total Bilirubin: 0.3 mg/dL (ref 0.3–1.2)
Total Protein: 7.3 g/dL (ref 6.5–8.1)

## 2020-08-07 LAB — VITAMIN B12: Vitamin B-12: 332 pg/mL (ref 180–914)

## 2020-08-07 LAB — RETICULOCYTES
Immature Retic Fract: 12.8 % (ref 2.3–15.9)
RBC.: 4.89 MIL/uL (ref 3.87–5.11)
Retic Count, Absolute: 68.9 10*3/uL (ref 19.0–186.0)
Retic Ct Pct: 1.4 % (ref 0.4–3.1)

## 2020-08-07 LAB — C-REACTIVE PROTEIN: CRP: 0.7 mg/dL (ref <1.0)

## 2020-08-07 LAB — IRON AND TIBC
Iron: 48 ug/dL (ref 28–170)
Saturation Ratios: 12 % (ref 10.4–31.8)
TIBC: 415 ug/dL (ref 250–450)
UIBC: 367 ug/dL

## 2020-08-07 LAB — SEDIMENTATION RATE: Sed Rate: 14 mm/hr (ref 0–22)

## 2020-08-07 LAB — HEPATITIS PANEL, ACUTE
HCV Ab: NONREACTIVE
Hep A IgM: NONREACTIVE
Hep B C IgM: NONREACTIVE
Hepatitis B Surface Ag: NONREACTIVE

## 2020-08-07 LAB — LIPASE, BLOOD: Lipase: 25 U/L (ref 11–51)

## 2020-08-07 LAB — FOLATE: Folate: 8.7 ng/mL (ref >5.9)

## 2020-08-07 LAB — FERRITIN: Ferritin: 20 ng/mL (ref 11–307)

## 2020-08-07 LAB — I-STAT BETA HCG BLOOD, ED (MC, WL, AP ONLY): I-stat hCG, quantitative: <5 m[IU]/mL (ref <5)

## 2020-08-07 LAB — SARS CORONAVIRUS 2 (TAT 6-24 HRS): SARS Coronavirus 2: NEGATIVE

## 2020-08-07 MED ORDER — METHYLPREDNISOLONE SODIUM SUCC 125 MG IJ SOLR
60.0000 mg | Freq: Every day | INTRAMUSCULAR | Status: DC
  Administered 2020-08-07 – 2020-08-13 (×7): 60 mg via INTRAVENOUS
  Filled 2020-08-07 (×7): qty 2

## 2020-08-07 MED ORDER — ACETAMINOPHEN 650 MG RE SUPP: 650.0000 mg | Freq: Four times a day (QID) | RECTAL | Status: DC | PRN

## 2020-08-07 MED ORDER — MORPHINE SULFATE (PF) 4 MG/ML IV SOLN
4.0000 mg | Freq: Once | INTRAVENOUS | Status: AC
  Administered 2020-08-07: 4 mg via INTRAVENOUS
  Filled 2020-08-07: qty 1

## 2020-08-07 MED ORDER — ENOXAPARIN SODIUM 40 MG/0.4ML ~~LOC~~ SOLN
40.0000 mg | SUBCUTANEOUS | Status: DC
  Filled 2020-08-07 (×4): qty 0.4

## 2020-08-07 MED ORDER — SODIUM CHLORIDE 0.9 % IV BOLUS
1000.0000 mL | Freq: Once | INTRAVENOUS | Status: AC
Start: 2020-08-07 — End: 2020-08-07
  Administered 2020-08-07: 1000 mL via INTRAVENOUS

## 2020-08-07 MED ORDER — ACETAMINOPHEN 325 MG PO TABS: 650.0000 mg | ORAL_TABLET | Freq: Four times a day (QID) | ORAL | Status: DC | PRN

## 2020-08-07 MED ORDER — ALBUTEROL SULFATE HFA 108 (90 BASE) MCG/ACT IN AERS: 1.0000 | INHALATION_SPRAY | RESPIRATORY_TRACT | Status: DC | PRN

## 2020-08-07 MED ORDER — HYDROMORPHONE HCL 1 MG/ML IJ SOLN
0.5000 mg | INTRAMUSCULAR | Status: DC | PRN
  Administered 2020-08-07 – 2020-08-13 (×14): 0.5 mg via INTRAVENOUS
  Filled 2020-08-07 (×8): qty 0.5
  Filled 2020-08-07: qty 1
  Filled 2020-08-07 (×5): qty 0.5

## 2020-08-07 MED ORDER — ZOLPIDEM TARTRATE 5 MG PO TABS
5.0000 mg | ORAL_TABLET | Freq: Every evening | ORAL | Status: DC | PRN
  Administered 2020-08-07 – 2020-08-08 (×2): 5 mg via ORAL
  Filled 2020-08-07 (×4): qty 1

## 2020-08-07 MED ORDER — ONDANSETRON HCL 4 MG/2ML IJ SOLN
4.0000 mg | Freq: Once | INTRAMUSCULAR | Status: AC
  Administered 2020-08-07: 4 mg via INTRAVENOUS
  Filled 2020-08-07: qty 2

## 2020-08-07 MED ORDER — DICYCLOMINE HCL 10 MG PO CAPS
10.0000 mg | ORAL_CAPSULE | Freq: Three times a day (TID) | ORAL | Status: DC
  Administered 2020-08-07 – 2020-08-14 (×21): 10 mg via ORAL
  Filled 2020-08-07 (×21): qty 1

## 2020-08-07 MED ORDER — HYDROCODONE-ACETAMINOPHEN 5-325 MG PO TABS
1.0000 | ORAL_TABLET | ORAL | Status: DC | PRN
Start: 2020-08-07 — End: 2020-08-13
  Administered 2020-08-07: 1 via ORAL
  Filled 2020-08-07: qty 1

## 2020-08-07 MED ORDER — ONDANSETRON HCL 4 MG/2ML IJ SOLN
4.0000 mg | Freq: Four times a day (QID) | INTRAMUSCULAR | Status: DC | PRN
  Administered 2020-08-07 – 2020-08-13 (×13): 4 mg via INTRAVENOUS
  Filled 2020-08-07 (×14): qty 2

## 2020-08-07 MED ORDER — LACTATED RINGERS IV SOLN: INTRAVENOUS | Status: AC

## 2020-08-07 NOTE — ED Notes (Signed)
Pt ambulated to restroom independently, provided urine sample, steady gait, no complications

## 2020-08-07 NOTE — ED Provider Notes (Signed)
[facility]-EMERGENCY DEPT Provider Note   CSN: [id number] Arrival date & time: 08/07/20  3976     History Chief Complaint  Patient presents with  . Abdominal Pain    Morgan Chung is a 35 y.o. female with a hx of anemia, asthma, gastritis, GERD, & insomnia who returns to the ED with complaints of abdominal pain since 2200 last night.  Patient states that she had a colonoscopy procedure yesterday, she was doing well after taking the bowel prep prior to as well as following the procedure.  She ate dinner around 7 PM which included rice and shredded pork and around 10 PM started to have return of discomfort.  She states the pain is located in the epigastric and left upper quadrant, feels like a squeezing discomfort, this is similar to the pain she was having prior to the procedure and what she has been having intermittently for the past couple of months.  Worse with movement.  No alleviating factors.  She has had some associated nausea and belching without vomiting.  She has not had a bowel movement since the procedure but has passed gas.  She denies fever, chills, dysuria, vaginal bleeding, or vaginal discharge.   HPI     Past Medical History:  Diagnosis Date  . Anemia   . Asthma   . Gastritis   . GERD (gastroesophageal reflux disease)   . Insomnia     There are no problems to display for this patient.   Past Surgical History:  Procedure Laterality Date  . CESAREAN SECTION       OB History   No obstetric history on file.     Family History  Problem Relation Age of Onset  . Hypertension Mother   . Irritable bowel syndrome Mother   . Diabetes Father   . Hypertension Father   . Irritable bowel syndrome Sister   . Colon cancer Neg Hx   . Pancreatic cancer Neg Hx   . Stomach cancer Neg Hx   . Esophageal cancer Neg Hx   . Liver cancer Neg Hx   . Colon polyps Neg Hx     Social History   Tobacco Use  . Smoking status: Never Smoker  . Smokeless  tobacco: Never Used  Vaping Use  . Vaping Use: Never used  Substance Use Topics  . Alcohol use: Yes    Comment: occasionally ( 3 times per month)  . Drug use: Never    Home Medications Prior to Admission medications   Medication Sig Start Date End Date Taking? Authorizing Provider  ciprofloxacin (CIPRO) 500 MG tablet Take 500 mg by mouth 2 (two) times daily. 08/01/20   [provider]  dicyclomine (BENTYL) 20 MG tablet Take 1 tablet (20 mg total) by mouth every 8 (eight) hours as needed for spasms. 01/10/20   Napoleon Form, MD  HYDROcodone-acetaminophen (NORCO/VICODIN) 5-325 MG tablet Take 1 tablet by mouth every 4 (four) hours as needed for moderate pain. 07/30/20   Gilda Crease, MD  methocarbamol (ROBAXIN) 500 MG tablet Take 1 tablet (500 mg total) by mouth 2 (two) times daily. 07/27/20   Farrel Gordon, PA-C  metroNIDAZOLE (FLAGYL) 500 MG tablet Take 500 mg by mouth 2 (two) times daily. 08/01/20   [provider]  omeprazole (PRILOSEC) 40 MG capsule Take 1 capsule (40 mg total) by mouth daily. Patient not taking: Reported on 08/06/2020 01/10/20   Napoleon Form, MD  ondansetron (ZOFRAN ODT) 4 MG disintegrating tablet Take  1 tablet (4 mg total) by mouth every 8 (eight) hours as needed for nausea or vomiting. 07/27/20   Farrel GordonPatel, Shalyn, PA-C  PROAIR HFA 108 [phone](90 Base) MCG/ACT inhaler Inhale 1 puff into the lungs every 4 (four) hours as needed for wheezing or shortness of breath. 06/11/20   [provider]  promethazine (PHENERGAN) 25 MG tablet Take 25-50 mg by mouth every 6 (six) hours as needed. 07/25/20   [provider]  sucralfate (CARAFATE) 1 g tablet Take 1 g by mouth 4 (four) times daily. 07/27/20   [provider]  sulfaSALAzine (AZULFIDINE) 500 MG tablet Take 1,000 mg by mouth 2 (two) times daily. 08/01/20   [provider]  zolpidem (AMBIEN) 10 MG tablet Take 10 mg by mouth at bedtime as needed. 01/09/20   [provider]    Allergies    Latex  Review of Systems   Review of Systems  Constitutional: Negative for chills and fever.  Respiratory: Negative for shortness of breath.   Cardiovascular: Negative for chest pain.  Gastrointestinal: Positive for abdominal pain and nausea. Negative for blood in stool, diarrhea and vomiting.  Genitourinary: Negative for dysuria, vaginal bleeding and vaginal discharge.  All other systems reviewed and are negative.   Physical Exam Updated Vital Signs BP (!) 142/113 (BP Location: Left Arm)   Pulse 95   Temp 98.9 F (37.2 C) (Oral)   Resp 18   Ht 5\' 5"  (1.651 m)   Wt 93.4 kg   LMP 07/13/2020 (Exact Date) Comment: negative beta HCG 07/29/20  SpO2 99%   BMI 34.28 kg/m   Physical Exam Vitals and nursing note reviewed.  Constitutional:      General: She is not in acute distress.    Appearance: She is well-developed. She is not toxic-appearing.  HENT:     Head: Normocephalic and atraumatic.  Eyes:     General:        Right eye: No discharge.        Left eye: No discharge.     Conjunctiva/sclera: Conjunctivae normal.  Cardiovascular:     Rate and Rhythm: Normal rate and regular rhythm.  Pulmonary:     Effort: Pulmonary effort is normal. No respiratory distress.     Breath sounds: Normal breath sounds. No wheezing, rhonchi or rales.  Abdominal:     General: There is no distension.     Palpations: Abdomen is soft.     Tenderness: There is abdominal tenderness in the epigastric area and left upper quadrant. There is no guarding or rebound.  Musculoskeletal:     Cervical back: Neck supple.  Skin:    General: Skin is warm and dry.     Findings: No rash.  Neurological:     Mental Status: She is alert.     Comments: Clear speech.   Psychiatric:        Behavior: Behavior normal.     ED Results / Procedures / Treatments   Labs (all labs ordered are listed, but only abnormal results are displayed) Labs Reviewed  COMPREHENSIVE METABOLIC  PANEL - Abnormal; Notable for the following components:      Result Value   CO2 21 (*)    Glucose, Bld 101 (*)    Creatinine, Ser 1.05 (*)    Calcium 8.7 (*)    All other components within normal limits  CBC WITH DIFFERENTIAL/PLATELET - Abnormal; Notable for the following components:   Hemoglobin 10.4 (*)    HCT 35.5 (*)  MCV 74.9 (*)    MCH 21.9 (*)    MCHC 29.3 (*)    RDW 17.8 (*)    All other components within normal limits  URINALYSIS, ROUTINE W REFLEX MICROSCOPIC - Abnormal; Notable for the following components:   Ketones, ur 5 (*)    All other components within normal limits  LIPASE, BLOOD  I-STAT BETA HCG BLOOD, ED ([facility], [facility], [facility] ONLY)    EKG None  Radiology DG Abd Acute W/Chest  Result Date: 08/07/2020 CLINICAL DATA:  Abdominal pain EXAM: DG ABDOMEN ACUTE WITH 1 VIEW CHEST COMPARISON:  CT abdomen and pelvis July 30, 2020 FINDINGS: PA chest: Lungs are clear. Heart size and pulmonary vascularity are normal. No adenopathy. No bone lesions. Supine and upright abdomen: There is no appreciable bowel dilatation. There are scattered air-fluid levels. No free air. Probable phleboliths in the right pelvis. IMPRESSION: Scattered air-fluid levels without bowel dilatation. Suspect a degree of ileus or enteritis. Bowel obstruction not felt to be likely. No free air evident. Lungs clear. Electronically Signed   By: Bretta Bang III M.D.   On: 08/07/2020 09:10    Procedures Procedures   Medications Ordered in ED Medications - No data to display  ED Course  I have reviewed the triage vital signs and the nursing notes.  Pertinent labs & imaging results that were available during my care of the patient were reviewed by me and considered in my medical decision making (see chart for details).    MDM Rules/Calculators/A&P                          Patient presents to the ED with complaints of abdominal pain.  Nontoxic, resting comfortably, vitals without significant abnormality.   She is epigastric and left upper quadrant tenderness to palpation on exam.  Additional history obtained:  Additional history obtained from chart review & nursing note review.  Has had multiple ED visits for abdominal pain- CT of the abdomen pelvis 07/30/20 showed circumferential bowel wall thickening involving the terminal ileum with surrounding inflammatory changes and reactive adenopathy- overall findings were more consistent with inflammatory bowel disease such as Crohn's disease-  She was given hydrocodone and Augmentin. 08/01/20 seen @ UC, Augmentin was stopped and she was started on metronidazole & ciprofloxacin as well as sulfasalazine and given an injection of methylprednisolone.  Seen by gastroenterology most recently 08/02/20- recommended colonoscopy, colonoscopy was performed yesterday: IMPRESSION:  - Tight stricture in the terminal ileum with deformed IC valve highly s/o Crohn's disease. Biopsied. - No colonic or peri-anal involvement.  Lab Tests:  I Ordered, reviewed, and interpreted labs, which included:  CBC, CMP, lipase, pregnancy test, urinalysis: Fairly unremarkable, similar anemia and renal function.  No leukocytosis.  Imaging Studies ordered:  I ordered imaging studies which included acute abdominal series x-ray, I independently reviewed, formal radiology impression shows: Scattered air-fluid levels without bowel dilatation. Suspect a degree of ileus or enteritis. Bowel obstruction not felt to be likely. No free air evident. Lungs clear.   ED Course:  08:37: CONSULT: Discussed with PA [place] with [facility]-will come see the patient in the emergency department.  Patient evaluated by GI- requesting hospitalist admission for new diagnosis of Crohns ileitis with stricture, will start steroids & clear liquids, appreciate input.   12:34: CONSULT: Discussed with hospitalist Dr. Dairl Ponder- accepts admission.   Portions of this note were generated with Scientist, clinical (histocompatibility and immunogenetics).  Dictation errors may occur despite best attempts at proofreading.  Final Clinical Impression(s) / ED Diagnoses Final diagnoses:  Crohn's disease of ileum with complication [facility])    Rx / DC Orders ED Discharge Orders    None       Desmond Lope 08/07/20 1239    Terrilee Files, MD 08/07/20 1759

## 2020-08-07 NOTE — ED Notes (Signed)
Pt seen by GI on 3/18 and underwent colonoscopy yesterday 08/06/2020.

## 2020-08-07 NOTE — ED Notes (Signed)
Pt decided to stay and be admitted, provider made aware

## 2020-08-07 NOTE — ED Notes (Signed)
Brought pt meal trey 

## 2020-08-07 NOTE — ED Triage Notes (Signed)
Pt reports upper and mid left sided abdominal pain since 2200 last night.

## 2020-08-07 NOTE — Telephone Encounter (Signed)
From: Lynann Bologna, MD  Sent: 08/07/2020  9:29 AM EDT  To: Sammuel Cooper, PA-C, *   Hi Jen,  Did colon on her yesterday.  She has tight TI stricture c/w Crohn's (Bx-pending)  No diarrhea  Went to ED after colon for abdo pain. X-ray neg for obs/perf.    Needs  -Can you please see her next week for FU. - Patient is scheduled for a follow up with Victorino Dike on Wednesday, 08/14/20 at 1:30 PM.   -Prednisone taper (40 x 1 week, then 30 x 1 week, then 20 x 2 weeks, 10 x 2 weeks, then stop.  -She is already on antibiotics (Cipro/Flagyl)  -Also need to check labs CRP, HBsAg, Hbc Ab, TPMT, TB gold. - Patient is currently admitted, most of the labs were ordered. She will still need TPMT, Hep B Antigen, Hep C antibody -CTE  -Suspect she would need Biologics + imuran  -If worsens, would have low threshold for surgical eval for resection (stricture appears to be some fibrotic as well)   RG

## 2020-08-07 NOTE — Consult Note (Addendum)
Consultation  Referring Provider: Janyce LlanosWLER Primary Care Physician:  Leilani Ableeese, Betti, MD Primary Gastroenterologist:  Dr.Nandigam  Reason for Consultation:  Abdominal pain  HPI: Morgan Chung is a 35 y.o. female, established with Dr. Lavon PaganiniNandigam, who presented to the emergency room this morning after undergoing colonoscopy yesterday per Dr. Chales AbrahamsGupta for evaluation of recent complaints of increased upper abdominal pain.  She had been in the ER on 07/30/2020 and had CT of the abdomen pelvis done which showed marked circumferential thickening of the terminal ileum with surrounding fat stranding concerning for IBD, also noted right lower quadrant lymphadenopathy. Colonoscopy yesterday showed a normal-appearing anus rectum and colon with a tight stricture in the terminal ileum with deformed ileocecal valve.  Biopsies were taken. Patient developed increased abdominal pain last night post procedure.  Abdominal films this morning show scattered air-fluid levels but no obstruction and no free air Labs today WBC 5.1, hemoglobin 10.4/hematocrit 35.5/MCV of 74.9 Potassium 3.6/BUN 7/creatinine 1.05 LFTs within normal limits.  Patient has been having somewhat progressive symptoms over the past several weeks, with ongoing complaints of upper abdominal pain.  She has been having fairly regular bowel movements, without blood. She had been started on Azulfidine per urgent care with no improvement in symptoms and is currently on a course of Cipro and Flagyl which was started after her ER visit last week. She is expressing frustration with requiring repeated ER and urgent care visits and is wanting what ever treatment will improve her symptoms in the most timely fashion.   Past Medical History:  Diagnosis Date  . Anemia   . Asthma   . Gastritis   . GERD (gastroesophageal reflux disease)   . Insomnia     Past Surgical History:  Procedure Laterality Date  . CESAREAN SECTION      Prior to Admission medications    Medication Sig Start Date End Date Taking? Authorizing Provider  ciprofloxacin (CIPRO) 500 MG tablet Take 500 mg by mouth 2 (two) times daily. 08/01/20  Yes [provider]  HYDROcodone-acetaminophen (NORCO/VICODIN) 5-325 MG tablet Take 1 tablet by mouth every 4 (four) hours as needed for moderate pain. 07/30/20  Yes Pollina, Canary Brimhristopher J, MD  metroNIDAZOLE (FLAGYL) 500 MG tablet Take 500 mg by mouth 2 (two) times daily. 08/01/20  Yes [provider]  ondansetron (ZOFRAN ODT) 4 MG disintegrating tablet Take 1 tablet (4 mg total) by mouth every 8 (eight) hours as needed for nausea or vomiting. 07/27/20  Yes Farrel GordonPatel, Shalyn, PA-C  PROAIR HFA 108 (90 Base) MCG/ACT inhaler Inhale 1 puff into the lungs every 4 (four) hours as needed for wheezing or shortness of breath. 06/11/20  Yes [provider]  promethazine (PHENERGAN) 25 MG tablet Take 25 mg by mouth every 6 (six) hours as needed for nausea or vomiting. 07/25/20  Yes [provider]  sulfaSALAzine (AZULFIDINE) 500 MG tablet Take 500 mg by mouth 4 (four) times daily. 08/01/20  Yes [provider]  zolpidem (AMBIEN) 10 MG tablet Take 10 mg by mouth at bedtime as needed for sleep. 01/09/20  Yes [provider]  dicyclomine (BENTYL) 20 MG tablet Take 1 tablet (20 mg total) by mouth every 8 (eight) hours as needed for spasms. Patient not taking: Reported on 08/07/2020 01/10/20   Napoleon FormNandigam, Kavitha V, MD  methocarbamol (ROBAXIN) 500 MG tablet Take 1 tablet (500 mg total) by mouth 2 (two) times daily. 07/27/20   Farrel GordonPatel, Shalyn, PA-C  omeprazole (PRILOSEC) 40 MG capsule Take 1 capsule (40 mg  total) by mouth daily. Patient not taking: No sig reported 01/10/20   Napoleon Form, MD    No current facility-administered medications for this encounter.   Current Outpatient Medications  Medication Sig Dispense Refill  . ciprofloxacin (CIPRO) 500 MG tablet Take 500 mg by mouth 2 (two) times daily.    Marland Kitchen  HYDROcodone-acetaminophen (NORCO/VICODIN) 5-325 MG tablet Take 1 tablet by mouth every 4 (four) hours as needed for moderate pain. 10 tablet 0  . metroNIDAZOLE (FLAGYL) 500 MG tablet Take 500 mg by mouth 2 (two) times daily.    . ondansetron (ZOFRAN ODT) 4 MG disintegrating tablet Take 1 tablet (4 mg total) by mouth every 8 (eight) hours as needed for nausea or vomiting. 20 tablet 0  . PROAIR HFA 108 (90 Base) MCG/ACT inhaler Inhale 1 puff into the lungs every 4 (four) hours as needed for wheezing or shortness of breath.    . promethazine (PHENERGAN) 25 MG tablet Take 25 mg by mouth every 6 (six) hours as needed for nausea or vomiting.    . sulfaSALAzine (AZULFIDINE) 500 MG tablet Take 500 mg by mouth 4 (four) times daily.    Marland Kitchen zolpidem (AMBIEN) 10 MG tablet Take 10 mg by mouth at bedtime as needed for sleep.    Marland Kitchen dicyclomine (BENTYL) 20 MG tablet Take 1 tablet (20 mg total) by mouth every 8 (eight) hours as needed for spasms. (Patient not taking: Reported on 08/07/2020) 90 tablet 3  . methocarbamol (ROBAXIN) 500 MG tablet Take 1 tablet (500 mg total) by mouth 2 (two) times daily. 20 tablet 0  . omeprazole (PRILOSEC) 40 MG capsule Take 1 capsule (40 mg total) by mouth daily. (Patient not taking: No sig reported) 90 capsule 3    Allergies as of 08/07/2020 - Review Complete 08/07/2020  Allergen Reaction Noted  . Latex Rash 01/27/2019    Family History  Problem Relation Age of Onset  . Hypertension Mother   . Irritable bowel syndrome Mother   . Diabetes Father   . Hypertension Father   . Irritable bowel syndrome Sister   . Colon cancer Neg Hx   . Pancreatic cancer Neg Hx   . Stomach cancer Neg Hx   . Esophageal cancer Neg Hx   . Liver cancer Neg Hx   . Colon polyps Neg Hx     Social History   Socioeconomic History  . Marital status: Single    Spouse name: Not on file  . Number of children: Not on file  . Years of education: Not on file  . Highest education level: Not on file   Occupational History  . Not on file  Tobacco Use  . Smoking status: Never Smoker  . Smokeless tobacco: Never Used  Vaping Use  . Vaping Use: Never used  Substance and Sexual Activity  . Alcohol use: Yes    Comment: occasionally ( 3 times per month)  . Drug use: Never  . Sexual activity: Yes    Partners: Male  Other Topics Concern  . Not on file  Social History Narrative  . Not on file   Social Determinants of Health   Financial Resource Strain: Not on file  Food Insecurity: Not on file  Transportation Needs: Not on file  Physical Activity: Not on file  Stress: Not on file  Social Connections: Not on file  Intimate Partner Violence: Not on file    Review of Systems: Pertinent positive and negative review of systems were noted in the above  HPI section.  All other review of systems was otherwise negative.Marland Kitchen  Physical Exam: Vital signs in last 24 hours: Temp:  [97.3 F (36.3 C)-98.9 F (37.2 C)] 98.9 F (37.2 C) (03/23 0617) Pulse Rate:  [66-98] 74 (03/23 0905) Resp:  [11-22] 18 (03/23 0904) BP: (112-159)/(69-113) 124/81 (03/23 0904) SpO2:  [97 %-100 %] 99 % (03/23 0905) Weight:  [91.2 kg-93.4 kg] 93.4 kg (03/23 0617)   General:   Alert,  Well-developed, well-nourished, African-American female pleasant and cooperative in NAD Head:  Normocephalic and atraumatic. Eyes:  Sclera clear, no icterus.   Conjunctiva pink. Ears:  Normal auditory acuity. Nose:  No deformity, discharge,  or lesions. Mouth:  No deformity or lesions.   Neck:  Supple; no masses or thyromegaly. Lungs:  Clear throughout to auscultation.   No wheezes, crackles, or rhonchi. Heart:  Regular rate and rhythm; no murmurs, clicks, rubs,  or gallops. Abdomen:  Soft, BS active she is tender across the upper and mid abdomen no guarding or rebound,,nonpalp mass or hsm.   Rectal:  Deferred  Msk:  Symmetrical without gross deformities. . Pulses:  Normal pulses noted. Extremities:  Without clubbing or  edema. Neurologic:  Alert and  oriented x4;  grossly normal neurologically. Skin:  Intact without significant lesions or rashes.. Psych:  Alert and cooperative. Normal mood and affect.  Intake/Output from previous day: No intake/output data recorded. Intake/Output this shift: Total I/O In: 1000 [IV Piggyback:1000] Out: -   Lab Results: Recent Labs    08/07/20 0650  WBC 5.1  HGB 10.4*  HCT 35.5*  PLT 228   BMET Recent Labs    08/07/20 0650  NA 137  K 3.6  CL 108  CO2 21*  GLUCOSE 101*  BUN 7  CREATININE 1.05*  CALCIUM 8.7*   LFT Recent Labs    08/07/20 0650  PROT 7.3  ALBUMIN 3.8  AST 15  ALT 14  ALKPHOS 48  BILITOT 0.3   PT/INR No results for input(s): LABPROT, INR in the last 72 hours. Hepatitis Panel No results for input(s): HEPBSAG, HCVAB, HEPAIGM, HEPBIGM in the last 72 hours.    IMPRESSION:  #39  35 year old African-American female with new diagnosis of what appears to be Crohn's ileitis with severe stricture of the terminal ileum. Marked circumferential wall thickening of the terminal ileum on CT, and tight stricture on colonoscopy yesterday.  Biopsies are pending Abdominal films today do not show obstruction but do show scattered air-fluid levels, given her increase in pain suspect she has a pending low-grade obstruction  It is unclear at this time how much of this process is acute inflammatory versus chronic fibrotic stricturing  #2 microcytic anemia-rule out iron deficiency  PLAN: I discussed the findings of CT scan and colonoscopy with the patient this morning.  This is a new diagnosis for her.  She is frustrated by her current illness and seeming lack of progress with improving her symptoms.  I explained that we now have a much better handle on what the underlying process is so that treatment can be started.  I also explained that this is not going to be a quick fix and that Crohn's disease is a chronic illness.  We discussed options of oral  steroids and home on a full liquid diet versus admission with IV steroids.  She has opted to be admitted, and hopefully IV steroids will offer her some benefit within the next 48 to 72 hours.  Start Solu-Medrol 60 mg IV daily Full liquid  diet Pain management as per hospitalist Bentyl 10 mg 3 times daily AC Okay to stop Cipro and Flagyl Stop Azulfidine, as no benefit in ileitis Sed rate, CRP, hepatitis B serologies, QuantiFERON gold, iron studies have all been ordered. She will likely require biologic therapy, if we can initially get some improvement with IV steroids.  If no improvement on IV steroids she may require surgery.    Amy Esterwood PA-C 08/07/2020, 9:55 AM

## 2020-08-07 NOTE — ED Notes (Addendum)
Patient transported to X-ray 

## 2020-08-07 NOTE — ED Notes (Signed)
Provider informed pt she contacted GI and they would be coming to speak w pt

## 2020-08-07 NOTE — H&P (Signed)
History and Physical        Hospital Admission Note Date: 08/07/2020  Patient name: Morgan Chung Medical record number: [id number] Date of birth: May 05, 1986 Age: 35 y.o. Gender: female  PCP: Lin Landsman, MD    Chief Complaint    Chief Complaint  Patient presents with  . Abdominal Pain      HPI:   This is a 35 year old female with a past medical history of anemia, asthma, gastritis, GERD, insomnia has been seen in the ED multiple times for abdominal pain recently who presented to the ED this morning after undergoing colonoscopy yesterday for evaluation of upper abdominal pain.  CT abdomen pelvis on 3/15 showed circumferential thickening of the terminal ileum with surrounding fat stranding concerning for IBD and noted RLQ lymphadenopathy.  Colonoscopy yesterday showed normal-appearing anus rectum and colon with a tight stricture in the terminal ileum and deformed ileocecal valve.  Has since had increasing abdominal pain last night post procedure and after eating dinner which was pork and rice.  Denies any known family history of IBD.  Admits to nausea but otherwise denies any bowel movements, vomiting or any other complaints.   ED Course: Afebrile, hemodynamically stable, on room air. Notable Labs: Sodium 137, K3.6, CO2 21, glucose 101, BUN 7, creatinine 1.05, WBC 5.1, Hb 10.4, platelets 228. Notable Imaging: Abdominal XR-scattered air-fluid levels without bowel dilatation suspect degree of ileus or enteritis, bowel obstruction not felt to be likely and no free air evident.  GI was consulted and believes this to be a new diagnosis of Crohn's ileitis with severe stricture of the terminal ileum.  Patient received morphine, Zofran, 1 L NS bolus, Solu-Medrol.    Vitals:   08/07/20 1400 08/07/20 1500  BP: 126/90 112/73  Pulse: 81 76  Resp: 16 18  Temp:    SpO2: 98% 100%     Review of  Systems:  Review of Systems  All other systems reviewed and are negative.   Medical/Social/Family History   Past Medical History: Past Medical History:  Diagnosis Date  . Anemia   . Asthma   . Gastritis   . GERD (gastroesophageal reflux disease)   . Insomnia     Past Surgical History:  Procedure Laterality Date  . CESAREAN SECTION      Medications: Prior to Admission medications   Medication Sig Start Date End Date Taking? Authorizing Provider  ciprofloxacin (CIPRO) 500 MG tablet Take 500 mg by mouth 2 (two) times daily. 08/01/20  Yes [provider]  HYDROcodone-acetaminophen (NORCO/VICODIN) 5-325 MG tablet Take 1 tablet by mouth every 4 (four) hours as needed for moderate pain. 07/30/20  Yes Pollina, Gwenyth Allegra, MD  metroNIDAZOLE (FLAGYL) 500 MG tablet Take 500 mg by mouth 2 (two) times daily. 08/01/20  Yes [provider]  ondansetron (ZOFRAN ODT) 4 MG disintegrating tablet Take 1 tablet (4 mg total) by mouth every 8 (eight) hours as needed for nausea or vomiting. 07/27/20  Yes Alfredia Client, PA-C  PROAIR HFA 108 [phone] Base) MCG/ACT inhaler Inhale 1 puff into the lungs every 4 (four) hours as needed for wheezing or shortness of breath. 06/11/20  Yes [provider]  promethazine (PHENERGAN) 25 MG tablet Take 25 mg by  mouth every 6 (six) hours as needed for nausea or vomiting. 07/25/20  Yes [provider]  sulfaSALAzine (AZULFIDINE) 500 MG tablet Take 500 mg by mouth 4 (four) times daily. 08/01/20  Yes [provider]  zolpidem (AMBIEN) 10 MG tablet Take 10 mg by mouth at bedtime as needed for sleep. 01/09/20  Yes [provider]  dicyclomine (BENTYL) 20 MG tablet Take 1 tablet (20 mg total) by mouth every 8 (eight) hours as needed for spasms. Patient not taking: Reported on 08/07/2020 01/10/20   Mauri Pole, MD  methocarbamol (ROBAXIN) 500 MG tablet Take 1 tablet (500 mg total) by mouth 2 (two) times daily. 07/27/20   Alfredia Client, PA-C  omeprazole (PRILOSEC) 40 MG capsule Take 1 capsule (40 mg total) by mouth daily. Patient not taking: No sig reported 01/10/20   Mauri Pole, MD    Allergies:   Allergies  Allergen Reactions  . Latex Rash    Social History:  reports that she has never smoked. She has never used smokeless tobacco. She reports current alcohol use. She reports that she does not use drugs.  Family History: Family History  Problem Relation Age of Onset  . Hypertension Mother   . Irritable bowel syndrome Mother   . Diabetes Father   . Hypertension Father   . Irritable bowel syndrome Sister   . Colon cancer Neg Hx   . Pancreatic cancer Neg Hx   . Stomach cancer Neg Hx   . Esophageal cancer Neg Hx   . Liver cancer Neg Hx   . Colon polyps Neg Hx      Objective   Physical Exam: Blood pressure 112/73, pulse 76, temperature 98.9 F (37.2 C), temperature source Oral, resp. rate 18, height _0  (1.651 m), weight 93.4 kg, last menstrual period 07/16/2020, SpO2 100 %.  Physical Exam Vitals and nursing note reviewed.  Constitutional:      Appearance: Normal appearance.  HENT:     Head: Normocephalic and atraumatic.  Eyes:     Conjunctiva/sclera: Conjunctivae normal.  Cardiovascular:     Rate and Rhythm: Normal rate and regular rhythm.  Pulmonary:     Effort: Pulmonary effort is normal.     Breath sounds: Normal breath sounds.  Abdominal:     General: Abdomen is flat.     Tenderness: There is generalized abdominal tenderness.  Musculoskeletal:        General: No swelling or tenderness.  Skin:    Coloration: Skin is not jaundiced or pale.  Neurological:     Mental Status: She is alert. Mental status is at baseline.  Psychiatric:        Mood and Affect: Mood normal.        Behavior: Behavior normal.     LABS on Admission: I have personally reviewed all the labs and imaging below    Basic Metabolic Panel: Recent Labs  Lab 08/07/20 0650  NA 137  K 3.6  CL 108   CO2 21*  GLUCOSE 101*  BUN 7  CREATININE 1.05*  CALCIUM 8.7*   Liver Function Tests: Recent Labs  Lab 08/07/20 0650  AST 15  ALT 14  ALKPHOS 48  BILITOT 0.3  PROT 7.3  ALBUMIN 3.8   Recent Labs  Lab 08/07/20 0650  LIPASE 25   No results for input(s): AMMONIA in the last 168 hours. CBC: Recent Labs  Lab 08/07/20 0650  WBC 5.1  NEUTROABS 3.4  HGB 10.4*  HCT 35.5*  MCV  74.9*  PLT 228   Cardiac Enzymes: No results for input(s): CKTOTAL, CKMB, CKMBINDEX, TROPONINI in the last 168 hours. BNP: Invalid input(s): POCBNP CBG: No results for input(s): GLUCAP in the last 168 hours.  Radiological Exams on Admission:  DG Abd Acute W/Chest  Result Date: 08/07/2020 CLINICAL DATA:  Abdominal pain EXAM: DG ABDOMEN ACUTE WITH 1 VIEW CHEST COMPARISON:  CT abdomen and pelvis July 30, 2020 FINDINGS: PA chest: Lungs are clear. Heart size and pulmonary vascularity are normal. No adenopathy. No bone lesions. Supine and upright abdomen: There is no appreciable bowel dilatation. There are scattered air-fluid levels. No free air. Probable phleboliths in the right pelvis. IMPRESSION: Scattered air-fluid levels without bowel dilatation. Suspect a degree of ileus or enteritis. Bowel obstruction not felt to be likely. No free air evident. Lungs clear. Electronically Signed   By: Lowella Grip III M.D.   On: 08/07/2020 09:10      EKG: Not done   A & P   Principal Problem:   Abdominal pain Active Problems:   Insomnia   Microcytic anemia   1. Abdominal pain, concerning for suspected new diagnosis of Crohn's ileitis with severe stricture of the terminal ileum a. GI consulted-Solu-Medrol 60 mg IV daily, full liquid diet, Bentyl 10 mg 3 times daily AC, stop Cipro and Flagyl, stop Azulfidine, check ESR, CRP, HBV, QuantiFERON gold, iron studies.  Likely will require biologic therapy but will hopefully have some improvement on steroids.  If no improvement with steroids then she may need  surgery b. Biopsies pending from colonoscopy yesterday c. Step up pain management: Tylenol, Norco, Dilaudid d. IV fluids e. Zofran for nausea  2. Insomnia a. Continue home Ambien  3. Microcytic anemia a. Checking iron studies    DVT prophylaxis: Lovenox   Code Status: Full Code  Diet: Full liquid diet Family Communication: Admission, patients condition and plan of care including tests being ordered have been discussed with the patient who indicates understanding and agrees with the plan and Code Status.  Disposition Plan: The appropriate patient status for this patient is INPATIENT. Inpatient status is judged to be reasonable and necessary in order to provide the required intensity of service to ensure the patient's safety. The patient's presenting symptoms, physical exam findings, and initial radiographic and laboratory data in the context of their chronic comorbidities is felt to place them at high risk for further clinical deterioration. Furthermore, it is not anticipated that the patient will be medically stable for discharge from the hospital within 2 midnights of admission. The following factors support the patient status of inpatient.   " The patient's presenting symptoms include abdominal pain. " The worrisome physical exam findings include abdominal pain. " The initial radiographic and laboratory data are worrisome because of concern for Crohn's ileitis and stricture. " The chronic co-morbidities include as above.   * I certify that at the point of admission it is my clinical judgment that the patient will require inpatient hospital care spanning beyond 2 midnights from the point of admission due to high intensity of service, high risk for further deterioration and high frequency of surveillance required.*   Status is: Inpatient  Remains inpatient appropriate because:Ongoing active pain requiring inpatient pain management, IV treatments appropriate due to intensity of illness  or inability to take PO and Inpatient level of care appropriate due to severity of illness   Dispo: The patient is from: Home              Anticipated  d/c is to: Home              Patient currently is not medically stable to d/c.   Difficult to place patient No    Consultants  . GI  Procedures  . None  Time Spent on Admission: 66 minutes    Harold Hedge, DO [facility]  08/07/2020, 4:34 PM

## 2020-08-07 NOTE — ED Notes (Signed)
GI at bedside

## 2020-08-07 NOTE — Telephone Encounter (Signed)
Baring, Amy S, PA-C  [place], [place], [place]; [place], [place], RN Patient is being admitted, I have ordered all the labs that she needs, you guys do not need to worry about it

## 2020-08-08 ENCOUNTER — Telehealth: Payer: Self-pay | Admitting: *Deleted

## 2020-08-08 LAB — BASIC METABOLIC PANEL
Anion gap: 9 (ref 5–15)
BUN: <5 mg/dL — ABNORMAL LOW (ref 6–20)
CO2: 19 mmol/L — ABNORMAL LOW (ref 22–32)
Calcium: 9.2 mg/dL (ref 8.9–10.3)
Chloride: 110 mmol/L (ref 98–111)
Creatinine, Ser: 0.86 mg/dL (ref 0.44–1.00)
GFR, Estimated: >60 mL/min (ref >60)
Glucose, Bld: 117 mg/dL — ABNORMAL HIGH (ref 70–99)
Potassium: 4.4 mmol/L (ref 3.5–5.1)
Sodium: 138 mmol/L (ref 135–145)

## 2020-08-08 LAB — CBC
HCT: 35 % — ABNORMAL LOW (ref 36.0–46.0)
Hemoglobin: 10.2 g/dL — ABNORMAL LOW (ref 12.0–15.0)
MCH: 21.9 pg — ABNORMAL LOW (ref 26.0–34.0)
MCHC: 29.1 g/dL — ABNORMAL LOW (ref 30.0–36.0)
MCV: 75.3 fL — ABNORMAL LOW (ref 80.0–100.0)
Platelets: 245 10*3/uL (ref 150–400)
RBC: 4.65 MIL/uL (ref 3.87–5.11)
RDW: 18.1 % — ABNORMAL HIGH (ref 11.5–15.5)
WBC: 8 10*3/uL (ref 4.0–10.5)
nRBC: 0 % (ref 0.0–0.2)

## 2020-08-08 LAB — HIV ANTIBODY (ROUTINE TESTING W REFLEX): HIV Screen 4th Generation wRfx: NONREACTIVE

## 2020-08-08 MED ORDER — PANTOPRAZOLE SODIUM 40 MG PO TBEC
40.0000 mg | DELAYED_RELEASE_TABLET | Freq: Every day | ORAL | Status: DC
Start: 2020-08-08 — End: 2020-08-14
  Administered 2020-08-08 – 2020-08-14 (×7): 40 mg via ORAL
  Filled 2020-08-08 (×7): qty 1

## 2020-08-08 NOTE — Telephone Encounter (Signed)
pt is currently admitted to hospital

## 2020-08-08 NOTE — Progress Notes (Signed)
Pt alert and aware sitting up in bed.I explained to pt what I was there for to talk with her about Advance Directive. She stated that she wishes to complete the form. The chaplain offered caring and supportive presence. He felicitated the completion of paperwork and gave copies to pt.

## 2020-08-08 NOTE — Progress Notes (Signed)
Patient ID: Morgan Chung, female   DOB: 1985/06/25, 35 y.o.   MRN: [id number]    Progress Note   Subjective   day # 2  CC; abdominal pain.  Probable Crohn's with tight TI stricture  Sed rate 14 CRP 0.7 Iron 48/TIBC 415/sat 12 Ferritin 20 B12 332 QuantiFERON gold pending  WBC 8.0, hemoglobin 10.2/hematocrit of 35  Patient says she feels about the same, pain medication is helpful but pain recurs within a few hours.  She is having increased pain with p.o. intake even with liquids but no vomiting.  Continues to feel pain primarily across the upper abdomen. No bowel movement since admit and not passing much flatus   Objective   Vital signs in last 24 hours: Temp:  [98.1 F (36.7 C)-98.8 F (37.1 C)] 98.4 F (36.9 C) (03/24 0523) Pulse Rate:  [67-83] 67 (03/24 0523) Resp:  [16-22] 17 (03/24 0523) BP: (103-142)/(67-103) 118/74 (03/24 0523) SpO2:  [97 %-100 %] 97 % (03/24 0523)   African-American female in NAD Heart:  Regular rate and rhythm; no murmurs Lungs: Respirations even and unlabored, lungs CTA bilaterally Abdomen:  Soft,   nondistended, tender across the upper mid abdomen, no rebound,. Normal bowel sounds. Extremities:  Without edema. Neurologic:  Alert and oriented,  grossly normal neurologically. Psych:  Cooperative. Normal mood and affect.  Intake/Output from previous day: 03/23 0701 - 03/24 0700 In: 1890.7 [I.V.:890.7; IV Piggyback:1000] Out: -  Intake/Output this shift: No intake/output data recorded.  Lab Results: Recent Labs    08/07/20 0650 08/08/20 0322  WBC 5.1 8.0  HGB 10.4* 10.2*  HCT 35.5* 35.0*  PLT 228 245   BMET Recent Labs    08/07/20 0650 08/08/20 0322  NA 137 138  K 3.6 4.4  CL 108 110  CO2 21* 19*  GLUCOSE 101* 117*  BUN 7 <5*  CREATININE 1.05* 0.86  CALCIUM 8.7* 9.2   LFT Recent Labs    08/07/20 0650  PROT 7.3  ALBUMIN 3.8  AST 15  ALT 14  ALKPHOS 48  BILITOT 0.3   PT/INR No results for input(s): LABPROT, INR in the  last 72 hours.  Studies/Results: DG Abd Acute W/Chest  Result Date: 08/07/2020 CLINICAL DATA:  Abdominal pain EXAM: DG ABDOMEN ACUTE WITH 1 VIEW CHEST COMPARISON:  CT abdomen and pelvis July 30, 2020 FINDINGS: PA chest: Lungs are clear. Heart size and pulmonary vascularity are normal. No adenopathy. No bone lesions. Supine and upright abdomen: There is no appreciable bowel dilatation. There are scattered air-fluid levels. No free air. Probable phleboliths in the right pelvis. IMPRESSION: Scattered air-fluid levels without bowel dilatation. Suspect a degree of ileus or enteritis. Bowel obstruction not felt to be likely. No free air evident. Lungs clear. Electronically Signed   By: Bretta Bang III M.D.   On: 08/07/2020 09:10       Assessment / Plan:    #46 35 year old African-American female with complaints of upper abdominal pain over the past 4 to 6 weeks, progressive. CT of the abdomen pelvis on 07/30/2020 showing circumferential bowel wall thickening of the terminal ileum. Colonoscopy 08/06/2020 noting normal-appearing rectum and colon and a tight stricture at the terminal ileum with a deformed ileocecal valve highly suspicious for Crohn's disease.  Patient developed increasing abdominal discomfort post colonoscopy, with nausea  Air-fluid levels on plain films but no overt obstruction  Biopsies are pending  Patient has been started on IV Solu-Medrol 60 mg daily We will continue clear liquid diet today Pain control  as indicated Hopefully symptoms will start to improve with IV steroids.  Not clear at this time how much of the process is acute inflammation versus chronic stricturing.     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

## 2020-08-08 NOTE — Progress Notes (Signed)
   08/08/20 1415  Assess: MEWS Score  Temp 98.6 F (37 C)  BP 136/88  Pulse Rate (!) 115  Resp 18  SpO2 100 %  O2 Device Room Air  Assess: MEWS Score  MEWS Temp 0  MEWS Systolic 0  MEWS Pulse 2  MEWS RR 0  MEWS LOC 0  MEWS Score 2  MEWS Score Color Yellow  Assess: if the MEWS score is Yellow or Red  Were vital signs taken at a resting state? Yes  Focused Assessment No change from prior assessment  Early Detection of Sepsis Score *See Row Information* Low  MEWS guidelines implemented *See Row Information* Yes  Treat  MEWS Interventions Escalated (See documentation below)  Pain Scale 0-10  Pain Score 0  Complains of Nausea /  Vomiting  Nausea relieved by Antiemetic  Patients response to intervention Effective  Take Vital Signs  Increase Vital Sign Frequency  Yellow: Q 2hr X 2 then Q 4hr X 2, if remains yellow, continue Q 4hrs  Escalate  MEWS: Escalate Yellow: discuss with charge nurse/RN and consider discussing with provider and RRT  Notify: Charge Nurse/RN  Name of Charge Nurse/RN Notified Gelene Mink RN  Date Charge Nurse/RN Notified 08/08/20  Time Charge Nurse/RN Notified 1415  Notify: Provider  Provider Name/Title Dr Ashley Royalty  Date Provider Notified 08/08/20  Time Provider Notified 1415  Notification Type Page  Notification Reason  (change to yellow mews)  Document  Progress note created (see row info) Yes  Patient in yellow mews at this time due to elevated heart rate.  Patient medicated for complaint of nausea, denies pain at this time.  Charge nurse notified, MD paged.

## 2020-08-08 NOTE — Progress Notes (Signed)
PROGRESS NOTE    Morgan Chung  TKW:[id number] DOB: 06/06/1985 DOA: 08/07/2020 PCP: [name], MD    Brief Narrative: 35 year old female with a past medical history of anemia, asthma, gastritis, GERD, insomnia has been seen in the ED multiple times for abdominal pain recently who presented to the ED this morning after undergoing colonoscopy yesterday for evaluation of upper abdominal pain.  CT abdomen pelvis on 3/15 showed circumferential thickening of the terminal ileum with surrounding fat stranding concerning for IBD and noted RLQ lymphadenopathy.  Colonoscopy yesterday showed normal-appearing anus rectum and colon with a tight stricture in the terminal ileum and deformed ileocecal valve.  Has since had increasing abdominal pain last night post procedure and after eating dinner which was pork and rice.  Denies any known family history of IBD.  Admits to nausea but otherwise denies any bowel movements, vomiting or any other complaints.  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  She is status post colonoscopy 08/06/2020.  CT abdomen 2/15/ 2022 shows circumferential bowel wall thickening of the terminal ileum. Patient started on IV Solu-Medrol.  She reports she was able to tolerate clear liquids without any vomiting however she continues to have upper abdominal pain. Colonoscopy with findings of tight stricture in the terminal ileum and deformed ileocecal valve. Seen by GI. Continue clear liquids hopefully she will improve with IV steroids.  I have also started her on some Protonix since her complaints of abdominal pain are in the upper abdominal area. Biopsy pending   Estimated body mass index is 34.28 kg/m as calculated from the following:   Height as of  this encounter: 5\' 5"  (1.651 m).   Weight as of this encounter: 93.4 kg.  DVT prophylaxis: Lovenox Code Status: Full code Family Communication: None at bedside disposition Plan:  Status is: Inpatient  Dispo: The patient is from: Home              Anticipated d/c is to: Home              Patient currently is not medically stable to d/c.   Difficult to place patient na   Consultants: gi  Procedures: none Antimicrobials:none  Subjective: Patient is resting in bed awake and alert reports she tolerated clear liquids with no nausea but had increased pain in the epigastrium upper abdominal area.  Objective: Vitals:   08/07/20 1701 08/07/20 2044 08/08/20 0106 08/08/20 0523  BP: 127/90 120/76 103/67 118/74  Pulse: 72 68 81 67  Resp: (!) 22 17 17 17   Temp: 98.1 F (36.7 C) 98.8 F (37.1 C) 98.3 F (36.8 C) 98.4 F (36.9 C)  TempSrc:   Oral Oral  SpO2: 100% 97% 99% 97%  Weight:      Height:        Intake/Output Summary (Last 24 hours) at 08/08/2020 1316 Last data filed at 08/08/2020 0300 Gross per 24 hour  Intake 890.68 ml  Output --  Net 890.68 ml   Filed Weights   08/07/20 0617  Weight: 93.4 kg    Examination:  General exam: Appears calm and comfortable  Respiratory system: Clear to auscultation. Respiratory effort normal. Cardiovascular system: S1 & S2 heard, RRR. No JVD, murmurs, rubs, gallops or clicks. No pedal edema. Gastrointestinal system: Abdomen  is nondistended, soft and nontender. No organomegaly or masses felt. Normal bowel sounds heard. Central nervous system: Alert and oriented. No focal neurological deficits. Extremities: Symmetric 5 x 5 power. Skin: No rashes, lesions or ulcers Psychiatry: Judgement and insight appear normal. Mood & affect appropriate.     Data Reviewed: I have personally reviewed following labs and imaging studies  CBC: Recent Labs  Lab 08/07/20 0650 08/08/20 0322  WBC 5.1 8.0  NEUTROABS 3.4  --   HGB 10.4* 10.2*  HCT 35.5*  35.0*  MCV 74.9* 75.3*  PLT 228 245   Basic Metabolic Panel: Recent Labs  Lab 08/07/20 0650 08/08/20 0322  NA 137 138  K 3.6 4.4  CL 108 110  CO2 21* 19*  GLUCOSE 101* 117*  BUN 7 <5*  CREATININE 1.05* 0.86  CALCIUM 8.7* 9.2   GFR: Estimated Creatinine Clearance: 105.2 mL/min (by C-G formula based on SCr of 0.86 mg/dL). Liver Function Tests: Recent Labs  Lab 08/07/20 0650  AST 15  ALT 14  ALKPHOS 48  BILITOT 0.3  PROT 7.3  ALBUMIN 3.8   Recent Labs  Lab 08/07/20 0650  LIPASE 25   No results for input(s): AMMONIA in the last 168 hours. Coagulation Profile: No results for input(s): INR, PROTIME in the last 168 hours. Cardiac Enzymes: No results for input(s): CKTOTAL, CKMB, CKMBINDEX, TROPONINI in the last 168 hours. BNP (last 3 results) No results for input(s): PROBNP in the last 8760 hours. HbA1C: No results for input(s): HGBA1C in the last 72 hours. CBG: No results for input(s): GLUCAP in the last 168 hours. Lipid Profile: No results for input(s): CHOL, HDL, LDLCALC, TRIG, CHOLHDL, LDLDIRECT in the last 72 hours. Thyroid Function Tests: No results for input(s): TSH, T4TOTAL, FREET4, T3FREE, THYROIDAB in the last 72 hours. Anemia Panel: Recent Labs    08/07/20 1717  VITAMINB12 332  FOLATE 8.7  FERRITIN 20  TIBC 415  IRON 48  RETICCTPCT 1.4   Sepsis Labs: No results for input(s): PROCALCITON, LATICACIDVEN in the last 168 hours.  Recent Results (from the past 240 hour(s))  SARS CORONAVIRUS 2 (TAT 6-24 HRS) Nasopharyngeal Nasopharyngeal Swab     Status: None   Collection Time: 08/07/20  4:46 PM   Specimen: Nasopharyngeal Swab  Result Value Ref Range Status   SARS Coronavirus 2 NEGATIVE NEGATIVE Final    Comment: (NOTE) SARS-CoV-2 target nucleic acids are NOT DETECTED.  The SARS-CoV-2 RNA is generally detectable in upper and lower respiratory specimens during the acute phase of infection. Negative results do not preclude SARS-CoV-2 infection, do  not rule out co-infections with other pathogens, and should not be used as the sole basis for treatment or other patient management decisions. Negative results must be combined with clinical observations, patient history, and epidemiological information. The expected result is Negative.  Fact Sheet for Patients: HairSlick.no  Fact Sheet for Healthcare Providers: quierodirigir.com  This test is not yet approved or cleared by the [place] FDA and  has been authorized for detection and/or diagnosis of SARS-CoV-2 by FDA under an Emergency Use Authorization (EUA). This EUA will remain  in effect (meaning this test can be used) for the duration of the COVID-19 declaration under Se ction 564(b)(1) of the Act, 21 U.S.C. section 360bbb-3(b)(1), unless the authorization is terminated or revoked sooner.  Performed at [facility], [address], [place], [place] [address]          Radiology Studies: DG Abd Acute W/Chest  Result Date: 08/07/2020  CLINICAL DATA:  Abdominal pain EXAM: DG ABDOMEN ACUTE WITH 1 VIEW CHEST COMPARISON:  CT abdomen and pelvis July 30, 2020 FINDINGS: PA chest: Lungs are clear. Heart size and pulmonary vascularity are normal. No adenopathy. No bone lesions. Supine and upright abdomen: There is no appreciable bowel dilatation. There are scattered air-fluid levels. No free air. Probable phleboliths in the right pelvis. IMPRESSION: Scattered air-fluid levels without bowel dilatation. Suspect a degree of ileus or enteritis. Bowel obstruction not felt to be likely. No free air evident. Lungs clear. Electronically Signed   By: Bretta Bang III M.D.   On: 08/07/2020 09:10        Scheduled Meds: . dicyclomine  10 mg Oral TID AC  . enoxaparin (LOVENOX) injection  40 mg Subcutaneous Q24H  . methylPREDNISolone (SOLU-MEDROL) injection  60 mg Intravenous Daily   Continuous Infusions:   LOS: 1 day       Alwyn Ren, MD 08/08/2020, 1:16 PM

## 2020-08-09 LAB — BASIC METABOLIC PANEL
Anion gap: 8 (ref 5–15)
BUN: 9 mg/dL (ref 6–20)
CO2: 22 mmol/L (ref 22–32)
Calcium: 9.2 mg/dL (ref 8.9–10.3)
Chloride: 106 mmol/L (ref 98–111)
Creatinine, Ser: 0.93 mg/dL (ref 0.44–1.00)
GFR, Estimated: >60 mL/min (ref >60)
Glucose, Bld: 95 mg/dL (ref 70–99)
Potassium: 4.2 mmol/L (ref 3.5–5.1)
Sodium: 136 mmol/L (ref 135–145)

## 2020-08-09 LAB — QUANTIFERON-TB GOLD PLUS (RQFGPL)
QuantiFERON Mitogen Value: 0.53 IU/mL
QuantiFERON Nil Value: 0.02 IU/mL
QuantiFERON TB1 Ag Value: 0.04 IU/mL
QuantiFERON TB2 Ag Value: 0.03 IU/mL

## 2020-08-09 LAB — QUANTIFERON-TB GOLD PLUS: QuantiFERON-TB Gold Plus: NEGATIVE

## 2020-08-09 LAB — CBC
HCT: 32.5 % — ABNORMAL LOW (ref 36.0–46.0)
Hemoglobin: 9.4 g/dL — ABNORMAL LOW (ref 12.0–15.0)
MCH: 21.9 pg — ABNORMAL LOW (ref 26.0–34.0)
MCHC: 28.9 g/dL — ABNORMAL LOW (ref 30.0–36.0)
MCV: 75.6 fL — ABNORMAL LOW (ref 80.0–100.0)
Platelets: 258 10*3/uL (ref 150–400)
RBC: 4.3 MIL/uL (ref 3.87–5.11)
RDW: 18.6 % — ABNORMAL HIGH (ref 11.5–15.5)
WBC: 11.1 10*3/uL — ABNORMAL HIGH (ref 4.0–10.5)
nRBC: 0 % (ref 0.0–0.2)

## 2020-08-09 NOTE — Progress Notes (Signed)
Patient ID: Morgan Chung, female   DOB: 08/08/1985, 35 y.o.   MRN: [id number]    Progress Note   Subjective  Day # 3 CC; suspected Crohn's ileitis with stricture terminal ileum  Solu-Medrol IV 60 mg daily  Surgical path-chronic active ileitis, colon biopsies negative  WBC 11.1, hemoglobin 9.4/hematocrit 32.5/MCV 75  Patient sitting up on the side of the bed, says she has been passing quite a bit of flatus since last night, still no bowel movement, but feels as if things are starting to move.  She has been tolerating full liquids, still requiring intermittent IV pain meds. Asking about increase in diet     Objective   Vital signs in last 24 hours: Temp:  [98.5 F (36.9 C)-99.3 F (37.4 C)] 98.5 F (36.9 C) (03/25 0159) Pulse Rate:  [60-115] 70 (03/25 0159) Resp:  [16-18] 18 (03/25 0159) BP: (120-141)/(73-88) 125/80 (03/25 0159) SpO2:  [98 %-100 %] 98 % (03/25 0159)   General:    Young African-American female in NAD Heart:  Regular rate and rhythm; no murmurs Lungs: Respirations even and unlabored, lungs CTA bilaterally Abdomen:  Soft,nondistended, bowel sounds are active mild tenderness across the mid abdomen no guarding Extremities:  Without edema. Neurologic:  Alert and oriented,  grossly normal neurologically. Psych:  Cooperative. Normal mood and affect.  Intake/Output from previous day: No intake/output data recorded. Intake/Output this shift: No intake/output data recorded.  Lab Results: Recent Labs    08/07/20 0650 08/08/20 0322 08/09/20 0420  WBC 5.1 8.0 11.1*  HGB 10.4* 10.2* 9.4*  HCT 35.5* 35.0* 32.5*  PLT 228 245 258   BMET Recent Labs    08/07/20 0650 08/08/20 0322 08/09/20 0420  NA 137 138 136  K 3.6 4.4 4.2  CL 108 110 106  CO2 21* 19* 22  GLUCOSE 101* 117* 95  BUN 7 <5* 9  CREATININE 1.05* 0.86 0.93  CALCIUM 8.7* 9.2 9.2   LFT Recent Labs    08/07/20 0650  PROT 7.3  ALBUMIN 3.8  AST 15  ALT 14  ALKPHOS 48  BILITOT 0.3    PT/INR No results for input(s): LABPROT, INR in the last 72 hours.  Studies/Results: No results found.     Assessment / Plan:    #62 35 year old female with several week history of progressive mid/upper abdominal pain Colonoscopy 08/06/2020 with finding of deformed ileocecal valve and tight stricture of the terminal ileum Biopsies are consistent with chronic active ileitis Inflammatory markers are normal  Seems to be having some improvement with IV Solu-Medrol, some decrease in abdominal pain, now passing flatus no overt obstruction on imaging Tolerating full liquids without difficulty  Plan; continue Solu-Medrol 60 mg IV daily Will advance to soft low residue diet Will discuss CT enterography which may be helpful to determine the length of the involved area and strictured area. Would like to give her a few more days on IV Solu-Medrol as she seems to be having some improvement prior to considering surgical consultation.    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

## 2020-08-09 NOTE — Progress Notes (Signed)
PROGRESS NOTE    Morgan Chung  STM:[id number] DOB: 05/03/1986 DOA: 08/07/2020 PCP: [name], MD    Brief Narrative: 35 year old female with a past medical history of anemia, asthma, gastritis, GERD, insomnia has been seen in the ED multiple times for abdominal pain recently who presented to the ED this morning after undergoing colonoscopy yesterday for evaluation of upper abdominal pain.  CT abdomen pelvis on 3/15 showed circumferential thickening of the terminal ileum with surrounding fat stranding concerning for IBD and noted RLQ lymphadenopathy.  Colonoscopy yesterday showed normal-appearing anus rectum and colon with a tight stricture in the terminal ileum and deformed ileocecal valve.  Has since had increasing abdominal pain last night post procedure and after eating dinner which was pork and rice.  Denies any known family history of IBD.  Admits to nausea but otherwise denies any bowel movements, vomiting or any other complaints.  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  She is status post colonoscopy 08/06/2020.  CT abdomen 2/15/ 2022 shows circumferential bowel wall thickening of the terminal ileum. Patient started on IV Solu-Medrol.  She reports she was able to tolerate clear liquids without any vomiting..  She has not had a BM.  She has been having flatus. Colonoscopy with findings of tight stricture in the terminal ileum and deformed ileocecal valve. GI advancing her diet to full liquids. Plan to continue IV Solu-Medrol. Biopsy pending   Estimated body mass index is 34.28 kg/m as calculated from the following:   Height as of this encounter: 5\' 5"  (1.651 m).   Weight as of this encounter: 93.4 kg.  DVT prophylaxis: Lovenox Code Status:  Full code Family Communication: None at bedside disposition Plan:  Status is: Inpatient  Dispo: The patient is from: Home              Anticipated d/c is to: Home              Patient currently is not medically stable to d/c.   Difficult to place patient na   Consultants: gi  Procedures: none Antimicrobials:none  Subjective: Patient is resting in bed reports has not had a bowel movement having flatus no nausea vomiting abdominal pain better   Objective: Vitals:   08/08/20 1741 08/08/20 1815 08/08/20 2219 08/09/20 0159  BP: 120/74 128/78 (!) 141/73 125/80  Pulse: 89 80 60 70  Resp: 18 18 16 18   Temp: 99.3 F (37.4 C) 98.7 F (37.1 C) 99 F (37.2 C) 98.5 F (36.9 C)  TempSrc: Oral Oral    SpO2: 99% 99% 100% 98%  Weight:      Height:       No intake or output data in the 24 hours ending 08/09/20 1124 Filed Weights   08/07/20 0617  Weight: 93.4 kg    Examination:  General exam: Appears calm and comfortable  Respiratory system: Clear to auscultation. Respiratory effort normal. Cardiovascular system: S1 & S2 heard, RRR. No JVD, murmurs, rubs, gallops or clicks. No pedal edema. Gastrointestinal system: Abdomen is nondistended, soft and nontender. No organomegaly or masses felt. Normal bowel sounds heard. Central nervous system: Alert and oriented. No focal neurological deficits. Extremities: Symmetric 5 x 5 power. Skin: No rashes, lesions or ulcers Psychiatry: Judgement and insight appear normal.  Mood & affect appropriate.     Data Reviewed: I have personally reviewed following labs and imaging studies  CBC: Recent Labs  Lab 08/07/20 0650 08/08/20 0322 08/09/20 0420  WBC 5.1 8.0 11.1*  NEUTROABS 3.4  --   --   HGB 10.4* 10.2* 9.4*  HCT 35.5* 35.0* 32.5*  MCV 74.9* 75.3* 75.6*  PLT 228 245 258   Basic Metabolic Panel: Recent Labs  Lab 08/07/20 0650 08/08/20 0322 08/09/20 0420  NA 137 138 136  K 3.6 4.4 4.2  CL 108 110 106  CO2 21* 19* 22  GLUCOSE  101* 117* 95  BUN 7 <5* 9  CREATININE 1.05* 0.86 0.93  CALCIUM 8.7* 9.2 9.2   GFR: Estimated Creatinine Clearance: 97.3 mL/min (by C-G formula based on SCr of 0.93 mg/dL). Liver Function Tests: Recent Labs  Lab 08/07/20 0650  AST 15  ALT 14  ALKPHOS 48  BILITOT 0.3  PROT 7.3  ALBUMIN 3.8   Recent Labs  Lab 08/07/20 0650  LIPASE 25   No results for input(s): AMMONIA in the last 168 hours. Coagulation Profile: No results for input(s): INR, PROTIME in the last 168 hours. Cardiac Enzymes: No results for input(s): CKTOTAL, CKMB, CKMBINDEX, TROPONINI in the last 168 hours. BNP (last 3 results) No results for input(s): PROBNP in the last 8760 hours. HbA1C: No results for input(s): HGBA1C in the last 72 hours. CBG: No results for input(s): GLUCAP in the last 168 hours. Lipid Profile: No results for input(s): CHOL, HDL, LDLCALC, TRIG, CHOLHDL, LDLDIRECT in the last 72 hours. Thyroid Function Tests: No results for input(s): TSH, T4TOTAL, FREET4, T3FREE, THYROIDAB in the last 72 hours. Anemia Panel: Recent Labs    08/07/20 1717  VITAMINB12 332  FOLATE 8.7  FERRITIN 20  TIBC 415  IRON 48  RETICCTPCT 1.4   Sepsis Labs: No results for input(s): PROCALCITON, LATICACIDVEN in the last 168 hours.  Recent Results (from the past 240 hour(s))  SARS CORONAVIRUS 2 (TAT 6-24 HRS) Nasopharyngeal Nasopharyngeal Swab     Status: None   Collection Time: 08/07/20  4:46 PM   Specimen: Nasopharyngeal Swab  Result Value Ref Range Status   SARS Coronavirus 2 NEGATIVE NEGATIVE Final    Comment: (NOTE) SARS-CoV-2 target nucleic acids are NOT DETECTED.  The SARS-CoV-2 RNA is generally detectable in upper and lower respiratory specimens during the acute phase of infection. Negative results do not preclude SARS-CoV-2 infection, do not rule out co-infections with other pathogens, and should not be used as the sole basis for treatment or other patient management decisions. Negative results  must be combined with clinical observations, patient history, and epidemiological information. The expected result is Negative.  Fact Sheet for Patients: HairSlick.no  Fact Sheet for Healthcare Providers: quierodirigir.com  This test is not yet approved or cleared by the [place] FDA and  has been authorized for detection and/or diagnosis of SARS-CoV-2 by FDA under an Emergency Use Authorization (EUA). This EUA will remain  in effect (meaning this test can be used) for the duration of the COVID-19 declaration under Se ction 564(b)(1) of the Act, 21 U.S.C. section 360bbb-3(b)(1), unless the authorization is terminated or revoked sooner.  Performed at [facility], [address], [place], [place] [address]          Radiology Studies: No results found.      Scheduled Meds: . dicyclomine  10 mg Oral TID AC  . enoxaparin (LOVENOX) injection  40 mg Subcutaneous Q24H  .  methylPREDNISolone (SOLU-MEDROL) injection  60 mg Intravenous Daily  . pantoprazole  40 mg Oral Daily   Continuous Infusions:   LOS: 2 days      Alwyn Ren, MD 08/09/2020, 11:24 AM

## 2020-08-10 ENCOUNTER — Inpatient Hospital Stay (HOSPITAL_COMMUNITY): Payer: Medicaid Other

## 2020-08-10 LAB — CBC
HCT: 33.7 % — ABNORMAL LOW (ref 36.0–46.0)
Hemoglobin: 10 g/dL — ABNORMAL LOW (ref 12.0–15.0)
MCH: 22.3 pg — ABNORMAL LOW (ref 26.0–34.0)
MCHC: 29.7 g/dL — ABNORMAL LOW (ref 30.0–36.0)
MCV: 75.2 fL — ABNORMAL LOW (ref 80.0–100.0)
Platelets: 284 10*3/uL (ref 150–400)
RBC: 4.48 MIL/uL (ref 3.87–5.11)
RDW: 18.7 % — ABNORMAL HIGH (ref 11.5–15.5)
WBC: 11.3 10*3/uL — ABNORMAL HIGH (ref 4.0–10.5)
nRBC: 0 % (ref 0.0–0.2)

## 2020-08-10 LAB — BASIC METABOLIC PANEL
Anion gap: 9 (ref 5–15)
BUN: 11 mg/dL (ref 6–20)
CO2: 25 mmol/L (ref 22–32)
Calcium: 9.5 mg/dL (ref 8.9–10.3)
Chloride: 102 mmol/L (ref 98–111)
Creatinine, Ser: 0.99 mg/dL (ref 0.44–1.00)
GFR, Estimated: >60 mL/min (ref >60)
Glucose, Bld: 95 mg/dL (ref 70–99)
Potassium: 3.9 mmol/L (ref 3.5–5.1)
Sodium: 136 mmol/L (ref 135–145)

## 2020-08-10 NOTE — Progress Notes (Signed)
Pt requesting abdominal ultrasound.  Pt also suggesting IV fluids as she is unable to eat today. She has only had ice chips. She feels like "food is sitting on top of her abdomen pushing up".

## 2020-08-10 NOTE — Progress Notes (Signed)
CROSS COVER [facility] Subjective: Morgan Chung is a 35 year old black female who has been hospitalized for right lower quadrant pain after per multiple visits to the emergency room and urgent cares and was noted to have a stricture in the terminal ileum that was biopsied on colonoscopy.  Biopsies revealed ileitis with no evidence of granulomata.  I am seeing the patient in cross coverage for Dr. Adela Lank over the weekend and she has multiple questions that she feels have not been answered.  As I walk into the room patient says she is not much better and that she is in horrible pain and nothing seems to have changed since admission. She feels her symptoms are the same as they were when she was admitted to the hospital.  She claims she had multiple EGDs and colonoscopies for "gastritis" done by her physician Dr. Betti Cruz in [place] [even though I told her the colonoscopy is not done for gastritis] but we do not have those records for review at the present time.  No evidence of Crohn's was noted on those colonoscopies to the best of my understanding.  Patient wants to know" why did this come from". She feels the diagnosis is incorrect and the steroids are not helping at all.  She also questions reason why all the inflammatory markers are within normal limits and what markers of the following to ensure she is getting better.  She feels very to be looking for other causes of her abdominal pain and that she is not being diagnosed accurately and her treatment is not appropriate.  She claims she is very frustrated and anxious to go home as she is a 72-year-old child at home.  She claims the pain in her abdomen is primarily in the epigastric and the left lower quadrant region not in the right lower quadrant.  Patient has my chart open on her phone and is reviewing the data as I discussed it with her.  Objective: Vital signs in last 24 hours: Temp:  [98 F (36.7 C)-98.6 F (37 C)] 98 F (36.7 C) (03/26 0501) Pulse Rate:   [66-71] 71 (03/26 0501) Resp:  [16-20] 20 (03/26 0501) BP: (128-148)/(94-104) 133/102 (03/26 0501) SpO2:  [99 %-100 %] 100 % (03/26 0501) Last BM Date: 08/09/20  Intake/Output from previous day: 03/25 0701 - 03/26 0700 In: 591 [P.O.:591] Out: -  Intake/Output this shift: No intake/output data recorded.  General appearance: alert, obese anxious black female in no acute distresse, appears stated age Resp: clear to auscultation bilaterally Cardio: regular rate and rhythm, S1, S2 normal, no murmur, click, rub or gallop GI: soft, non-tender; bowel sounds normal; no masses,  no organomegaly Lab Results: Recent Labs    08/08/20 0322 08/09/20 0420 08/10/20 0409  WBC 8.0 11.1* 11.3*  HGB 10.2* 9.4* 10.0*  HCT 35.0* 32.5* 33.7*  PLT 245 258 284   BMET Recent Labs    08/08/20 0322 08/09/20 0420 08/10/20 0409  NA 138 136 136  K 4.4 4.2 3.9  CL 110 106 102  CO2 19* 22 25  GLUCOSE 117* 95 95  BUN <5* 9 11  CREATININE 0.86 0.93 0.99  CALCIUM 9.2 9.2 9.5   Hepatitis Panel Recent Labs    08/07/20 1143  HEPBSAG NON REACTIVE  HCVAB NON REACTIVE  HEPAIGM NON REACTIVE  HEPBIGM NON REACTIVE   Medications: I have reviewed the patient's current medications.  Assessment/Plan: Abdominal pain with terminal ileitis on CT scan confirmed by colonoscopy and biopsy showing ileitis without granuloma.  On steroids-as per my description above the patient is very dissatisfied with her care. I had a long discussion with Dr. Blanchard Mane who is the [facility] hospitalist taking care of her. I will discuss the situation with Dr. Edman Circle on Monday when he resumes care for this patient. 2) History of gastritis/GERD/epigastric pain-will order a abdominal ultrasound ASAP. 3) Microcytic anemia. 4) Asthma. 5) Obesity. .  LOS: 3 days   Morgan Chung 08/10/2020, 8:53 AM

## 2020-08-10 NOTE — Progress Notes (Signed)
PROGRESS NOTE    Morgan Chung  TDD:[id number] DOB: 1985-08-14 DOA: 08/07/2020 PCP: [name], MD    Brief Narrative: 35 year old female with a past medical history of anemia, asthma, gastritis, GERD, insomnia has been seen in the ED multiple times for abdominal pain recently who presented to the ED this morning after undergoing colonoscopy yesterday for evaluation of upper abdominal pain.  CT abdomen pelvis on 3/15 showed circumferential thickening of the terminal ileum with surrounding fat stranding concerning for IBD and noted RLQ lymphadenopathy.  Colonoscopy yesterday showed normal-appearing anus rectum and colon with a tight stricture in the terminal ileum and deformed ileocecal valve.  Has since had increasing abdominal pain last night post procedure and after eating dinner which was pork and rice.  Denies any known family history of IBD.  Admits to nausea but otherwise denies any bowel movements, vomiting or any other complaints.  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   She received 2 doses of IV Dilaudid overnight for abdominal pain.  She reports she does not feel much better than yesterday she feels about the same as yesterday.  She had 1 bowel movement 08/09/2020.  Passing flatus.  She is status post colonoscopy 08/06/2020.  CT abdomen 2/15/ 2022 shows circumferential bowel wall thickening of the terminal ileum. I will continue IV Solu-Medrol for today hoping we can change her to prednisone tomorrow. Out of bed ambulate PT consult Colonoscopy with findings of tight stricture in the terminal ileum and deformed ileocecal valve. Advance diet as tolerated. Plan to continue IV Solu-Medrol. Biopsy consistent with chronic active ileitis  Estimated  body mass index is 34.28 kg/m as calculated from the following:   Height as of this encounter: 5\' 5"  (1.651 m).   Weight as of this encounter: 93.4 kg.  DVT prophylaxis: Lovenox Code Status: Full code Family Communication: None at bedside  disposition Plan:  Status is: Inpatient  Dispo: The patient is from: Home              Anticipated d/c is to: Home              Patient currently is not medically stable to d/c.   Difficult to place patient na   Consultants: gi  Procedures: none Antimicrobials:none  Subjective: She is resting in bed no family at bedside today.  She is awake and alert complaining of abdominal pain overnight she had to take 2 doses of IV Dilaudid.  No nausea vomiting.  Had a bowel movement yesterday. She feels about the same as yesterday not much better continues with abdominal pain. Objective: Vitals:   08/09/20 0159 08/09/20 1338 08/09/20 2058 08/10/20 0501  BP: 125/80 (!) 148/104 (!) 128/94 (!) 133/102  Pulse: 70 69 66 71  Resp: 18 18 16 20   Temp: 98.5 F (36.9 C) 98.2 F (36.8 C) 98.6 F (37 C) 98 F (36.7 C)  TempSrc:  Oral Oral Oral  SpO2: 98% 100% 99% 100%  Weight:      Height:        Intake/Output Summary (Last 24 hours) at 08/10/2020 1135 Last data filed at 08/10/2020 0900 Gross per 24 hour  Intake 831 ml  Output --  Net 831 ml   Filed Weights   08/07/20 0617  Weight: 93.4 kg    Examination:  General exam: Appears calm and comfortable  Respiratory system: Clear to auscultation. Respiratory effort normal. Cardiovascular system: S1 & S2 heard, RRR. No JVD, murmurs, rubs, gallops or clicks. No pedal edema. Gastrointestinal system: Abdomen is nondistended, soft and nontender. No organomegaly or masses felt. Normal bowel sounds heard. Central nervous system: Alert and oriented. No focal neurological deficits. Extremities: Symmetric 5 x 5 power. Skin: No rashes, lesions or ulcers Psychiatry: Judgement and insight appear normal. Mood &  affect appropriate.     Data Reviewed: I have personally reviewed following labs and imaging studies  CBC: Recent Labs  Lab 08/07/20 0650 08/08/20 0322 08/09/20 0420 08/10/20 0409  WBC 5.1 8.0 11.1* 11.3*  NEUTROABS 3.4  --   --   --   HGB 10.4* 10.2* 9.4* 10.0*  HCT 35.5* 35.0* 32.5* 33.7*  MCV 74.9* 75.3* 75.6* 75.2*  PLT 228 245 258 284   Basic Metabolic Panel: Recent Labs  Lab 08/07/20 0650 08/08/20 0322 08/09/20 0420 08/10/20 0409  NA 137 138 136 136  K 3.6 4.4 4.2 3.9  CL 108 110 106 102  CO2 21* 19* 22 25  GLUCOSE 101* 117* 95 95  BUN 7 <5* 9 11  CREATININE 1.05* 0.86 0.93 0.99  CALCIUM 8.7* 9.2 9.2 9.5   GFR: Estimated Creatinine Clearance: 91.4 mL/min (by C-G formula based on SCr of 0.99 mg/dL). Liver Function Tests: Recent Labs  Lab 08/07/20 0650  AST 15  ALT 14  ALKPHOS 48  BILITOT 0.3  PROT 7.3  ALBUMIN 3.8   Recent Labs  Lab 08/07/20 0650  LIPASE 25   No results for input(s): AMMONIA in the last 168 hours. Coagulation Profile: No results for input(s): INR, PROTIME in the last 168 hours. Cardiac Enzymes: No results for input(s): CKTOTAL, CKMB, CKMBINDEX, TROPONINI in the last 168 hours. BNP (last 3 results) No results for input(s): PROBNP in the last 8760 hours. HbA1C: No results for input(s): HGBA1C in the last 72 hours. CBG: No results for input(s): GLUCAP in the last 168 hours. Lipid Profile: No results for input(s): CHOL, HDL, LDLCALC, TRIG, CHOLHDL, LDLDIRECT in the last 72 hours. Thyroid Function Tests: No results for input(s): TSH, T4TOTAL, FREET4, T3FREE, THYROIDAB in the last 72 hours. Anemia Panel: Recent Labs    08/07/20 1717  VITAMINB12 332  FOLATE 8.7  FERRITIN 20  TIBC 415  IRON 48  RETICCTPCT 1.4   Sepsis Labs: No results for input(s): PROCALCITON, LATICACIDVEN in the last 168 hours.  Recent Results (from the past 240 hour(s))  SARS CORONAVIRUS 2 (TAT 6-24 HRS) Nasopharyngeal Nasopharyngeal Swab     Status:  None   Collection Time: 08/07/20  4:46 PM   Specimen: Nasopharyngeal Swab  Result Value Ref Range Status   SARS Coronavirus 2 NEGATIVE NEGATIVE Final    Comment: (NOTE) SARS-CoV-2 target nucleic acids are NOT DETECTED.  The SARS-CoV-2 RNA is generally detectable in upper and lower respiratory specimens during the acute phase of infection. Negative results do not preclude SARS-CoV-2 infection, do not rule out co-infections with other pathogens, and should not be used as the sole basis for treatment or other patient management decisions. Negative results must be combined with clinical observations, patient history, and epidemiological information. The expected result is Negative.  Fact Sheet for Patients: HairSlick.no  Fact Sheet for Healthcare Providers: quierodirigir.com  This test is not yet approved or cleared by the [place] FDA and  has been authorized for detection and/or  diagnosis of SARS-CoV-2 by FDA under an Emergency Use Authorization (EUA). This EUA will remain  in effect (meaning this test can be used) for the duration of the COVID-19 declaration under Se ction 564(b)(1) of the Act, 21 U.S.C. section 360bbb-3(b)(1), unless the authorization is terminated or revoked sooner.  Performed at [facility], [address], [place], [place] [address]          Radiology Studies: No results found.      Scheduled Meds:  dicyclomine  10 mg Oral TID AC   enoxaparin (LOVENOX) injection  40 mg Subcutaneous Q24H   methylPREDNISolone (SOLU-MEDROL) injection  60 mg Intravenous Daily   pantoprazole  40 mg Oral Daily   Continuous Infusions:   LOS: 3 days      Alwyn Ren, MD 08/10/2020, 11:35 AM

## 2020-08-10 NOTE — Progress Notes (Signed)
PT Cancellation Note  Patient Details Name: Morgan Chung MRN: [id number] DOB: 12-04-1985   Cancelled Treatment:    Reason Eval/Treat Not Completed: PT screened, no needs identified, will sign off. Order received. Chart reviewed. PT eval not warranted. Spoke with pt who reports she has no mobility issues or PT needs. Also observed pt ambulating without difficulty. Wil sign off.    Faye Ramsay, PT Acute Rehabilitation  Office: [phone] Pager: [phone]

## 2020-08-10 NOTE — Progress Notes (Signed)
Writer has talked to pt extensively at this time. She is very frustrated about her care and feels that her "fullness and backing up of food" is not improving. She feels that the "stricture" needs fixing in order to be able to eat and get rid of the pain that she experiencing on her left side and umbilical area.  She states that she has been to ED and Urgent Care multiple times over the last 2 weeks and feels as if "nothing is happening". She is frightened that she will be sent home and nothing will have been done to help her. Will continue to offer support and measures to decrease pain.

## 2020-08-10 NOTE — Progress Notes (Signed)
PT Cancellation Note  Patient Details Name: Morgan Chung MRN: [id number] DOB: 1985-05-21   Cancelled Treatment:    Reason Eval/Treat Not Completed:  Attempted PT eval-pt declined participation at this time. She requested PT check back a little later today. Will check back as schedule allows.    Faye Ramsay, PT Acute Rehabilitation  Office: [phone] Pager: [phone]

## 2020-08-11 ENCOUNTER — Inpatient Hospital Stay (HOSPITAL_COMMUNITY): Payer: Medicaid Other

## 2020-08-11 LAB — BASIC METABOLIC PANEL
Anion gap: 12 (ref 5–15)
BUN: 14 mg/dL (ref 6–20)
CO2: 23 mmol/L (ref 22–32)
Calcium: 9.4 mg/dL (ref 8.9–10.3)
Chloride: 103 mmol/L (ref 98–111)
Creatinine, Ser: 0.98 mg/dL (ref 0.44–1.00)
GFR, Estimated: >60 mL/min (ref >60)
Glucose, Bld: 106 mg/dL — ABNORMAL HIGH (ref 70–99)
Potassium: 3.6 mmol/L (ref 3.5–5.1)
Sodium: 138 mmol/L (ref 135–145)

## 2020-08-11 LAB — CBC
HCT: 32.9 % — ABNORMAL LOW (ref 36.0–46.0)
Hemoglobin: 10 g/dL — ABNORMAL LOW (ref 12.0–15.0)
MCH: 22.6 pg — ABNORMAL LOW (ref 26.0–34.0)
MCHC: 30.4 g/dL (ref 30.0–36.0)
MCV: 74.4 fL — ABNORMAL LOW (ref 80.0–100.0)
Platelets: 278 10*3/uL (ref 150–400)
RBC: 4.42 MIL/uL (ref 3.87–5.11)
RDW: 18.8 % — ABNORMAL HIGH (ref 11.5–15.5)
WBC: 8.3 10*3/uL (ref 4.0–10.5)
nRBC: 0 % (ref 0.0–0.2)

## 2020-08-11 NOTE — Consult Note (Signed)
CC: abd pain  Requesting provider: Dr Ashley Royalty  HPI: Morgan Chung is an 35 y.o. female who is here for terminal ileitis and stricture.  She has been on steroids for 5 days now with no real change in her abdominal pain.  She has had this pain for several wks.  Her pain is midepigastric.  CT scan shows short segment inflammation of her terminal ileum without abscess.  She does have a h/o significant NSAID use as well.  She is concerned that her symptoms have not improved with IV steroids and would like to discuss the next step in care.    Past Medical History:  Diagnosis Date  . Anemia   . Asthma   . Gastritis   . GERD (gastroesophageal reflux disease)   . Insomnia     Past Surgical History:  Procedure Laterality Date  . CESAREAN SECTION      Family History  Problem Relation Age of Onset  . Hypertension Mother   . Irritable bowel syndrome Mother   . Diabetes Father   . Hypertension Father   . Irritable bowel syndrome Sister   . Colon cancer Neg Hx   . Pancreatic cancer Neg Hx   . Stomach cancer Neg Hx   . Esophageal cancer Neg Hx   . Liver cancer Neg Hx   . Colon polyps Neg Hx     Social:  reports that she has never smoked. She has never used smokeless tobacco. She reports current alcohol use. She reports that she does not use drugs.  Allergies:  Allergies  Allergen Reactions  . Latex Rash    Medications: I have reviewed the patient's current medications.  Results for orders placed or performed during the hospital encounter of 08/07/20 (from the past 48 hour(s))  Basic metabolic panel     Status: None   Collection Time: 08/10/20  4:09 AM  Result Value Ref Range   Sodium 136 135 - 145 mmol/L   Potassium 3.9 3.5 - 5.1 mmol/L   Chloride 102 98 - 111 mmol/L   CO2 25 22 - 32 mmol/L   Glucose, Bld 95 70 - 99 mg/dL    Comment: Glucose reference range applies only to samples taken after fasting for at least 8 hours.   BUN 11 6 - 20 mg/dL   Creatinine, Ser 7.98 0.44 -  1.00 mg/dL   Calcium 9.5 8.9 - 92.1 mg/dL   GFR, Estimated >19 >41 mL/min    Comment: (NOTE) Calculated using the CKD-EPI Creatinine Equation (2021)    Anion gap 9 5 - 15    Comment: Performed at [facility], [address], [place], [place] [address]  CBC     Status: Abnormal   Collection Time: 08/10/20  4:09 AM  Result Value Ref Range   WBC 11.3 (H) 4.0 - 10.5 K/uL   RBC 4.48 3.87 - 5.11 MIL/uL   Hemoglobin 10.0 (L) 12.0 - 15.0 g/dL   HCT 44.8 (L) 18.5 - 63.1 %   MCV 75.2 (L) 80.0 - 100.0 fL   MCH 22.3 (L) 26.0 - 34.0 pg   MCHC 29.7 (L) 30.0 - 36.0 g/dL   RDW 49.7 (H) 02.6 - 37.8 %   Platelets 284 150 - 400 K/uL   nRBC 0.0 0.0 - 0.2 %    Comment: Performed at [facility], [address], [place], [place] [address]  Basic metabolic panel     Status: Abnormal   Collection Time: 08/11/20  3:14  AM  Result Value Ref Range   Sodium 138 135 - 145 mmol/L   Potassium 3.6 3.5 - 5.1 mmol/L   Chloride 103 98 - 111 mmol/L   CO2 23 22 - 32 mmol/L   Glucose, Bld 106 (H) 70 - 99 mg/dL    Comment: Glucose reference range applies only to samples taken after fasting for at least 8 hours.   BUN 14 6 - 20 mg/dL   Creatinine, Ser 2.50 0.44 - 1.00 mg/dL   Calcium 9.4 8.9 - 53.9 mg/dL   GFR, Estimated >76 >73 mL/min    Comment: (NOTE) Calculated using the CKD-EPI Creatinine Equation (2021)    Anion gap 12 5 - 15    Comment: Performed at [facility], [address], [place], [place] [address]  CBC     Status: Abnormal   Collection Time: 08/11/20  3:14 AM  Result Value Ref Range   WBC 8.3 4.0 - 10.5 K/uL   RBC 4.42 3.87 - 5.11 MIL/uL   Hemoglobin 10.0 (L) 12.0 - 15.0 g/dL   HCT 90.2 (L) 40.9 - 73.5 %   MCV 74.4 (L) 80.0 - 100.0 fL   MCH 22.6 (L) 26.0 - 34.0 pg   MCHC 30.4 30.0 - 36.0 g/dL   RDW 32.9 (H) 92.4 - 26.8 %   Platelets 278 150 - 400 K/uL   nRBC 0.0 0.0 - 0.2 %    Comment: Performed at [facility],  [address], [place], [place] [address]    US Abdomen Complete  Result Date: 08/11/2020 CLINICAL DATA:  Abdominal pain EXAM: ABDOMEN ULTRASOUND COMPLETE COMPARISON:  07/30/2020 FINDINGS: Gallbladder: Gallbladder is well distended with multiple stones within. No wall thickening or pericholecystic fluid is noted. Common bile duct: Diameter: 4 mm. Liver: Slight increased echogenicity is noted likely related to fatty infiltration. No focal mass is seen. Portal vein is patent on color Doppler imaging with normal direction of blood flow towards the liver. IVC: No abnormality visualized. Pancreas: Visualized portion unremarkable. Spleen: Size and appearance within normal limits. Right Kidney: Length: 11 cm. Echogenicity within normal limits. No mass or hydronephrosis visualized. Left Kidney: Length: 11.1 cm. Echogenicity within normal limits. No mass or hydronephrosis visualized. Abdominal aorta: No aneurysm visualized. Other findings: None. IMPRESSION: Fatty liver. Cholelithiasis without complicating factors. Electronically Signed   By: Alcide Clever M.D.   On: 08/11/2020 01:16    ROS - all of the below systems have been reviewed with the patient and positives are indicated with bold text General: chills, fever or night sweats Eyes: blurry vision or double vision ENT: epistaxis or sore throat Allergy/Immunology: itchy/watery eyes or nasal congestion Hematologic/Lymphatic: bleeding problems, blood clots or swollen lymph nodes Endocrine: temperature intolerance or unexpected weight changes Breast: new or changing breast lumps or nipple discharge Resp: cough, shortness of breath, or wheezing CV: chest pain or dyspnea on exertion GI: as per HPI GU: dysuria, trouble voiding, or hematuria MSK: joint pain or joint stiffness Neuro: TIA or stroke symptoms Derm: pruritus and skin lesion changes Psych: anxiety and depression  PE Blood pressure 117/79, pulse (!) 58, temperature 97.9 F (36.6 C),  temperature source Oral, resp. rate 16, height 5\' 5"  (1.651 m), weight 93.4 kg, last menstrual period 07/16/2020, SpO2 98 %. Constitutional: NAD; conversant; no deformities Lungs: Normal respiratory effort; CV: RRR; no palpable thrills; no pitting edema GI: Abd soft, mid epigastric pain MSK: Normal range of motion of extremities; no clubbing/cyanosis Psychiatric: Appropriate affect; alert and  oriented x3   Results for orders placed or performed during the hospital encounter of 08/07/20 (from the past 48 hour(s))  Basic metabolic panel     Status: None   Collection Time: 08/10/20  4:09 AM  Result Value Ref Range   Sodium 136 135 - 145 mmol/L   Potassium 3.9 3.5 - 5.1 mmol/L   Chloride 102 98 - 111 mmol/L   CO2 25 22 - 32 mmol/L   Glucose, Bld 95 70 - 99 mg/dL    Comment: Glucose reference range applies only to samples taken after fasting for at least 8 hours.   BUN 11 6 - 20 mg/dL   Creatinine, Ser 4.48 0.44 - 1.00 mg/dL   Calcium 9.5 8.9 - 18.5 mg/dL   GFR, Estimated >63 >14 mL/min    Comment: (NOTE) Calculated using the CKD-EPI Creatinine Equation (2021)    Anion gap 9 5 - 15    Comment: Performed at [facility], [address] [place], [place] [address]  CBC     Status: Abnormal   Collection Time: 08/10/20  4:09 AM  Result Value Ref Range   WBC 11.3 (H) 4.0 - 10.5 K/uL   RBC 4.48 3.87 - 5.11 MIL/uL   Hemoglobin 10.0 (L) 12.0 - 15.0 g/dL   HCT 37.8 (L) 58.8 - 50.2 %   MCV 75.2 (L) 80.0 - 100.0 fL   MCH 22.3 (L) 26.0 - 34.0 pg   MCHC 29.7 (L) 30.0 - 36.0 g/dL   RDW 77.4 (H) 12.8 - 78.6 %   Platelets 284 150 - 400 K/uL   nRBC 0.0 0.0 - 0.2 %    Comment: Performed at [facility], [address], [place], [place] [address]  Basic metabolic panel     Status: Abnormal   Collection Time: 08/11/20  3:14 AM  Result Value Ref Range   Sodium 138 135 - 145 mmol/L   Potassium 3.6 3.5 - 5.1 mmol/L   Chloride 103 98 - 111 mmol/L   CO2 23 22 -  32 mmol/L   Glucose, Bld 106 (H) 70 - 99 mg/dL    Comment: Glucose reference range applies only to samples taken after fasting for at least 8 hours.   BUN 14 6 - 20 mg/dL   Creatinine, Ser 9.47 0.44 - 1.00 mg/dL   Calcium 9.4 8.9 - 09.6 mg/dL   GFR, Estimated >28 >36 mL/min    Comment: (NOTE) Calculated using the CKD-EPI Creatinine Equation (2021)    Anion gap 12 5 - 15    Comment: Performed at [facility], [address], [place], [place] [address]  CBC     Status: Abnormal   Collection Time: 08/11/20  3:14 AM  Result Value Ref Range   WBC 8.3 4.0 - 10.5 K/uL   RBC 4.42 3.87 - 5.11 MIL/uL   Hemoglobin 10.0 (L) 12.0 - 15.0 g/dL   HCT 65.4 (L) 65.0 - 35.4 %   MCV 74.4 (L) 80.0 - 100.0 fL   MCH 22.6 (L) 26.0 - 34.0 pg   MCHC 30.4 30.0 - 36.0 g/dL   RDW 65.6 (H) 81.2 - 75.1 %   Platelets 278 150 - 400 K/uL   nRBC 0.0 0.0 - 0.2 %    Comment: Performed at [facility], [address], [place], [place] [address]    US Abdomen Complete  Result Date: 08/11/2020 CLINICAL DATA:  Abdominal pain EXAM: ABDOMEN ULTRASOUND COMPLETE COMPARISON:  07/30/2020 FINDINGS:  Gallbladder: Gallbladder is well distended with multiple stones within. No wall thickening or pericholecystic fluid is noted. Common bile duct: Diameter: 4 mm. Liver: Slight increased echogenicity is noted likely related to fatty infiltration. No focal mass is seen. Portal vein is patent on color Doppler imaging with normal direction of blood flow towards the liver. IVC: No abnormality visualized. Pancreas: Visualized portion unremarkable. Spleen: Size and appearance within normal limits. Right Kidney: Length: 11 cm. Echogenicity within normal limits. No mass or hydronephrosis visualized. Left Kidney: Length: 11.1 cm. Echogenicity within normal limits. No mass or hydronephrosis visualized. Abdominal aorta: No aneurysm visualized. Other findings: None. IMPRESSION: Fatty liver. Cholelithiasis without  complicating factors. Electronically Signed   By: Alcide CleverMark  Lukens M.D.   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  I think this is due to her NPO status and not an indication for surgery at this time, but could explain her upper abd pain.  She also has a terminal ileal stricture.  The real question is can she eat?  Would recommend advancing diet as tolerated to see.  I would think that she should see improvement  on IV steroids by this time. if this was acute inflammation.   If she is unable to tolerate a diet, surgery could be considered, as this is a very short segment of inflammation on CT and her pathology and inflammatory work up are somewhat inconclusive for IBD.    Vanita PandaAlicia C Thomas, MD  Colorectal and General Surgery [facility]

## 2020-08-11 NOTE — Progress Notes (Signed)
PROGRESS NOTE    Morgan Chung  FTD:[id number] DOB: [birth year] DOA: 08/07/2020 PCP: [name], MD    Brief Narrative: 35 year old female with a past medical history of anemia, asthma, gastritis, GERD, insomnia has been seen in the ED multiple times for abdominal pain recently who presented to the ED this morning after undergoing colonoscopy yesterday for evaluation of upper abdominal pain.  CT abdomen pelvis on 3/15 showed circumferential thickening of the terminal ileum with surrounding fat stranding concerning for IBD and noted RLQ lymphadenopathy.  Colonoscopy yesterday showed normal-appearing anus rectum and colon with a tight stricture in the terminal ileum and deformed ileocecal valve.  Has since had increasing abdominal pain last night post procedure and after eating dinner which was pork and rice.  Denies any known family history of IBD.  Admits to nausea but otherwise denies any bowel movements, vomiting or any other complaints.  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   She received 2 doses of IV Dilaudid again overnight for abdominal pain.  She reports she does not feel much better than yesterday she feels about the same as yesterday.  She had 1 bowel movement 08/09/2020.  Passing flatus.  She is status post colonoscopy 08/06/2020.  CT abdomen 2/15/ 2022 shows circumferential bowel wall thickening of the terminal ileum. I will continue IV Solu-Medrol for today hoping we can change her to prednisone tomorrow. Colonoscopy with findings of tight stricture in the terminal ileum and deformed ileocecal valve. Advance diet as tolerated. Plan to continue IV Solu-Medrol. Biopsy consistent with chronic active ileitis Appreciate surgery input. Abdominal  ultrasound shows gallstones but no evidence of inflammation. Continue PPI. Advised her to get out of bed ambulate.   Estimated body mass index is 34.28 kg/m as calculated from the following:   Height as of this encounter: 5\' 5"  (1.651 m).   Weight as of this encounter: 93.4 kg.  DVT prophylaxis: Lovenox Code Status: Full code Family Communication: None at bedside  disposition Plan:  Status is: Inpatient  Dispo: The patient is from: Home              Anticipated d/c is to: Home              Patient currently is not medically stable to d/c.   Difficult to place patient na   Consultants: gi  Procedures: none Antimicrobials:none  Subjective: Continues to complain of abdominal pain received IV Dilaudid x2 overnight. Had pudding kept it down  Objective: Vitals:   08/10/20 0501 08/10/20 1401 08/10/20 2037 08/11/20 0523  BP: (!) 133/102 (!) 140/97 125/89 117/79  Pulse: 71 77 65 (!) 58  Resp: 20 17 16 16   Temp: 98 F (36.7 C) 99 F (37.2 C) 99 F (37.2 C) 97.9 F (36.6 C)  TempSrc: Oral  Oral Oral  SpO2: 100% 98% 96% 98%  Weight:      Height:        Intake/Output Summary (Last 24 hours) at 08/11/2020 1138 Last data filed at 08/11/2020 0841 Gross per 24 hour  Intake 240 ml  Output -  Net 240 ml   Filed Weights   08/07/20 0617  Weight: 93.4 kg    Examination:  General exam: Appears calm and comfortable  Respiratory system: Clear  to auscultation. Respiratory effort normal. Cardiovascular system: S1 & S2 heard, RRR. No JVD, murmurs, rubs, gallops or clicks. No pedal edema. Gastrointestinal system: Abdomen is nondistended, soft and nontender. No organomegaly or masses felt. Normal bowel sounds heard. Central nervous system: Alert and oriented. No focal neurological deficits. Extremities: Symmetric 5 x 5 power. Skin: No rashes, lesions or ulcers Psychiatry: Judgement and insight appear normal. Mood & affect appropriate.     Data Reviewed: I have personally  reviewed following labs and imaging studies  CBC: Recent Labs  Lab 08/07/20 0650 08/08/20 0322 08/09/20 0420 08/10/20 0409 08/11/20 0314  WBC 5.1 8.0 11.1* 11.3* 8.3  NEUTROABS 3.4  --   --   --   --   HGB 10.4* 10.2* 9.4* 10.0* 10.0*  HCT 35.5* 35.0* 32.5* 33.7* 32.9*  MCV 74.9* 75.3* 75.6* 75.2* 74.4*  PLT 228 245 258 284 278   Basic Metabolic Panel: Recent Labs  Lab 08/07/20 0650 08/08/20 0322 08/09/20 0420 08/10/20 0409 08/11/20 0314  NA 137 138 136 136 138  K 3.6 4.4 4.2 3.9 3.6  CL 108 110 106 102 103  CO2 21* 19* 22 25 23   GLUCOSE 101* 117* 95 95 106*  BUN 7 <5* 9 11 14   CREATININE 1.05* 0.86 0.93 0.99 0.98  CALCIUM 8.7* 9.2 9.2 9.5 9.4   GFR: Estimated Creatinine Clearance: 92.3 mL/min (by C-G formula based on SCr of 0.98 mg/dL). Liver Function Tests: Recent Labs  Lab 08/07/20 0650  AST 15  ALT 14  ALKPHOS 48  BILITOT 0.3  PROT 7.3  ALBUMIN 3.8   Recent Labs  Lab 08/07/20 0650  LIPASE 25   No results for input(s): AMMONIA in the last 168 hours. Coagulation Profile: No results for input(s): INR, PROTIME in the last 168 hours. Cardiac Enzymes: No results for input(s): CKTOTAL, CKMB, CKMBINDEX, TROPONINI in the last 168 hours. BNP (last 3 results) No results for input(s): PROBNP in the last 8760 hours. HbA1C: No results for input(s): HGBA1C in the last 72 hours. CBG: No results for input(s): GLUCAP in the last 168 hours. Lipid Profile: No results for input(s): CHOL, HDL, LDLCALC, TRIG, CHOLHDL, LDLDIRECT in the last 72 hours. Thyroid Function Tests: No results for input(s): TSH, T4TOTAL, FREET4, T3FREE, THYROIDAB in the last 72 hours. Anemia Panel: No results for input(s): VITAMINB12, FOLATE, FERRITIN, TIBC, IRON, RETICCTPCT in the last 72 hours. Sepsis Labs: No results for input(s): PROCALCITON, LATICACIDVEN in the last 168 hours.  Recent Results (from the past 240 hour(s))  SARS CORONAVIRUS 2 (TAT 6-24 HRS) Nasopharyngeal Nasopharyngeal  Swab     Status: None   Collection Time: 08/07/20  4:46 PM   Specimen: Nasopharyngeal Swab  Result Value Ref Range Status   SARS Coronavirus 2 NEGATIVE NEGATIVE Final    Comment: (NOTE) SARS-CoV-2 target nucleic acids are NOT DETECTED.  The SARS-CoV-2 RNA is generally detectable in upper and lower respiratory specimens during the acute phase of infection. Negative results do not preclude SARS-CoV-2 infection, do not rule out co-infections with other pathogens, and should not be used as the sole basis for treatment or other patient management decisions. Negative results must be combined with clinical observations, patient history, and epidemiological information. The expected result is Negative.  Fact Sheet for Patients: 08/09/20  Fact Sheet for Healthcare Providers: 08/09/20  This test is not yet approved or cleared by the [employer] FDA and  has been authorized for detection and/or diagnosis of SARS-CoV-2 by FDA under an Emergency Use Authorization (  EUA). This EUA will remain  in effect (meaning this test can be used) for the duration of the COVID-19 declaration under Se ction 564(b)(1) of the Act, 21 U.S.C. section 360bbb-3(b)(1), unless the authorization is terminated or revoked sooner.  Performed at [facility], [address], [place], [place] [address]          Radiology Studies: US Abdomen Complete  Result Date: 08/11/2020 CLINICAL DATA:  Abdominal pain EXAM: ABDOMEN ULTRASOUND COMPLETE COMPARISON:  07/30/2020 FINDINGS: Gallbladder: Gallbladder is well distended with multiple stones within. No wall thickening or pericholecystic fluid is noted. Common bile duct: Diameter: 4 mm. Liver: Slight increased echogenicity is noted likely related to fatty infiltration. No focal mass is seen. Portal vein is patent on color Doppler imaging with normal direction of blood flow towards the liver. IVC: No  abnormality visualized. Pancreas: Visualized portion unremarkable. Spleen: Size and appearance within normal limits. Right Kidney: Length: 11 cm. Echogenicity within normal limits. No mass or hydronephrosis visualized. Left Kidney: Length: 11.1 cm. Echogenicity within normal limits. No mass or hydronephrosis visualized. Abdominal aorta: No aneurysm visualized. Other findings: None. IMPRESSION: Fatty liver. Cholelithiasis without complicating factors. Electronically Signed   By: Alcide Clever M.D.   On: 08/11/2020 01:16        Scheduled Meds: . dicyclomine  10 mg Oral TID AC  . enoxaparin (LOVENOX) injection  40 mg Subcutaneous Q24H  . methylPREDNISolone (SOLU-MEDROL) injection  60 mg Intravenous Daily  . pantoprazole  40 mg Oral Daily   Continuous Infusions:   LOS: 4 days      Alwyn Ren, MD 08/11/2020, 11:38 AM

## 2020-08-12 ENCOUNTER — Inpatient Hospital Stay (HOSPITAL_COMMUNITY): Payer: Medicaid Other

## 2020-08-12 LAB — HEPATIC FUNCTION PANEL
ALT: 25 U/L (ref 0–44)
AST: 18 U/L (ref 15–41)
Albumin: 3.5 g/dL (ref 3.5–5.0)
Alkaline Phosphatase: 40 U/L (ref 38–126)
Bilirubin, Direct: <0.1 mg/dL (ref 0.0–0.2)
Total Bilirubin: 0.4 mg/dL (ref 0.3–1.2)
Total Protein: 6.8 g/dL (ref 6.5–8.1)

## 2020-08-12 LAB — BASIC METABOLIC PANEL
Anion gap: 7 (ref 5–15)
BUN: 15 mg/dL (ref 6–20)
CO2: 24 mmol/L (ref 22–32)
Calcium: 9.2 mg/dL (ref 8.9–10.3)
Chloride: 105 mmol/L (ref 98–111)
Creatinine, Ser: 0.86 mg/dL (ref 0.44–1.00)
GFR, Estimated: >60 mL/min (ref >60)
Glucose, Bld: 113 mg/dL — ABNORMAL HIGH (ref 70–99)
Potassium: 3.9 mmol/L (ref 3.5–5.1)
Sodium: 136 mmol/L (ref 135–145)

## 2020-08-12 LAB — CBC
HCT: 32.9 % — ABNORMAL LOW (ref 36.0–46.0)
Hemoglobin: 9.9 g/dL — ABNORMAL LOW (ref 12.0–15.0)
MCH: 22.4 pg — ABNORMAL LOW (ref 26.0–34.0)
MCHC: 30.1 g/dL (ref 30.0–36.0)
MCV: 74.4 fL — ABNORMAL LOW (ref 80.0–100.0)
Platelets: 270 K/uL (ref 150–400)
RBC: 4.42 MIL/uL (ref 3.87–5.11)
RDW: 18.6 % — ABNORMAL HIGH (ref 11.5–15.5)
WBC: 8.6 K/uL (ref 4.0–10.5)
nRBC: 0 % (ref 0.0–0.2)

## 2020-08-12 LAB — URINALYSIS, ROUTINE W REFLEX MICROSCOPIC
Bacteria, UA: NONE SEEN
Bilirubin Urine: NEGATIVE
Glucose, UA: NEGATIVE mg/dL
Ketones, ur: NEGATIVE mg/dL
Leukocytes,Ua: NEGATIVE
Nitrite: NEGATIVE
Protein, ur: NEGATIVE mg/dL
Specific Gravity, Urine: 1.008 (ref 1.005–1.030)
pH: 6 (ref 5.0–8.0)

## 2020-08-12 MED ORDER — BISACODYL 10 MG RE SUPP
10.0000 mg | Freq: Once | RECTAL | Status: AC
  Administered 2020-08-12: 10 mg via RECTAL
  Filled 2020-08-12: qty 1

## 2020-08-12 MED ORDER — BARIUM SULFATE 0.1 % PO SUSP
ORAL | Status: AC
  Administered 2020-08-12: 1350 mL via ORAL
  Filled 2020-08-12: qty 3

## 2020-08-12 MED ORDER — IOHEXOL 300 MG/ML  SOLN
125.0000 mL | Freq: Once | INTRAMUSCULAR | Status: AC | PRN
  Administered 2020-08-12: 125 mL via INTRAVENOUS

## 2020-08-12 NOTE — Plan of Care (Signed)
  Problem: Education: Goal: Knowledge of General Education information will improve Description: Including pain rating scale, medication(s)/side effects and non-pharmacologic comfort measures Outcome: Progressing   Problem: Clinical Measurements: Goal: Will remain free from infection Outcome: Progressing   Problem: Activity: Goal: Risk for activity intolerance will decrease Outcome: Progressing   Problem: Nutrition: Goal: Adequate nutrition will be maintained Outcome: Progressing   Problem: Elimination: Goal: Will not experience complications related to urinary retention Outcome: Progressing   

## 2020-08-12 NOTE — Progress Notes (Signed)
PROGRESS NOTE    Morgan Chung  PNT:[id number] DOB: 1985-06-18 DOA: 08/07/2020 PCP: [name], MD    Brief Narrative: 35 year old female with a past medical history of anemia, asthma, gastritis, GERD, insomnia has been seen in the ED multiple times for abdominal pain recently who presented to the ED this morning after undergoing colonoscopy yesterday for evaluation of upper abdominal pain.  CT abdomen pelvis on 3/15 showed circumferential thickening of the terminal ileum with surrounding fat stranding concerning for IBD and noted RLQ lymphadenopathy.  Colonoscopy yesterday showed normal-appearing anus rectum and colon with a tight stricture in the terminal ileum and deformed ileocecal valve.  Has since had increasing abdominal pain last night post procedure and after eating dinner which was pork and rice.  Denies any known family history of IBD.  Admits to nausea but otherwise denies any bowel movements, vomiting or any other complaints.  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   She has been receiving Dilaudid overnight for abdominal pain.  She reports she is not feeling any better.  She has not had a BM in 3 days.  She does not want Colace senna Dulcolax lactulose etc.  She agreed to take a Dulcolax suppository which she later refused.  She complains of cramps in the lower to mid abdomen.  She is passing flatus.  Her last bowel movement was 08/09/2020. She is status post colonoscopy 08/06/2020.  Colonoscopy with findings of tight stricture in the terminal ileum and deformed ileocecal valve.  CT abdomen 2/15/ 2022 shows circumferential bowel wall thickening of the terminal ileum. She continues on IV Solu-Medrol 60 mg daily. Advance diet as tolerated. Biopsy  consistent with chronic active ileitis Appreciate surgery and GI input. Abdominal ultrasound shows gallstones but no evidence of inflammation. Continue PPI. Advised her to get out of bed ambulate.   Estimated body mass index is 34.28 kg/m as calculated from the following:   Height as of this encounter: 5\' 5"  (1.651 m).   Weight as of this encounter: 93.4 kg.  DVT prophylaxis: Lovenox Code Status: Full code Family Communication: None at bedside  disposition Plan:  Status is: Inpatient  Dispo: The patient is from: Home              Anticipated d/c is to: Home              Patient currently is not medically stable to d/c.   Difficult to place patient na   Consultants: gi  Procedures: none Antimicrobials:none  Subjective: Reports eating a regular diet yesterday however has not moved her bowels no nausea vomiting. Having flatus.  Objective: Vitals:   08/11/20 0523 08/11/20 1342 08/11/20 2023 08/12/20 0454  BP: 117/79 (!) 133/94 (!) 138/98 (!) 145/95  Pulse: (!) 58 71 64 (!) 57  Resp: 16 16 20 16   Temp: 97.9 F (36.6 C) 98.6 F (37 C) 99 F (37.2 C) 98.1 F (36.7 C)  TempSrc: Oral  Oral Oral  SpO2: 98% 99% 99% 100%  Weight:      Height:        Intake/Output Summary (Last 24 hours) at 08/12/2020 1106 Last data filed at 08/12/2020 0900 Gross per 24 hour  Intake 1200 ml  Output -  Net 1200 ml  Filed Weights   08/07/20 0617  Weight: 93.4 kg    Examination:  General exam: Appears calm and comfortable  Respiratory system: Clear to auscultation. Respiratory effort normal. Cardiovascular system: S1 & S2 heard, RRR. No JVD, murmurs, rubs, gallops or clicks. No pedal edema. Gastrointestinal system: Abdomen is nondistended, soft and nontender. No organomegaly or masses felt. Normal bowel sounds heard. Central nervous system: Alert and oriented. No focal neurological deficits. Extremities: Symmetric 5 x 5 power. Skin: No rashes, lesions or ulcers Psychiatry: Judgement  and insight appear normal. Mood & affect appropriate.     Data Reviewed: I have personally reviewed following labs and imaging studies  CBC: Recent Labs  Lab 08/07/20 0650 08/08/20 0322 08/09/20 0420 08/10/20 0409 08/11/20 0314 08/12/20 0334  WBC 5.1 8.0 11.1* 11.3* 8.3 8.6  NEUTROABS 3.4  --   --   --   --   --   HGB 10.4* 10.2* 9.4* 10.0* 10.0* 9.9*  HCT 35.5* 35.0* 32.5* 33.7* 32.9* 32.9*  MCV 74.9* 75.3* 75.6* 75.2* 74.4* 74.4*  PLT 228 245 258 284 278 270   Basic Metabolic Panel: Recent Labs  Lab 08/08/20 0322 08/09/20 0420 08/10/20 0409 08/11/20 0314 08/12/20 0334  NA 138 136 136 138 136  K 4.4 4.2 3.9 3.6 3.9  CL 110 106 102 103 105  CO2 19* 22 25 23 24   GLUCOSE 117* 95 95 106* 113*  BUN <5* 9 11 14 15   CREATININE 0.86 0.93 0.99 0.98 0.86  CALCIUM 9.2 9.2 9.5 9.4 9.2   GFR: Estimated Creatinine Clearance: 105.2 mL/min (by C-G formula based on SCr of 0.86 mg/dL). Liver Function Tests: Recent Labs  Lab 08/07/20 0650 08/12/20 0334  AST 15 18  ALT 14 25  ALKPHOS 48 40  BILITOT 0.3 0.4  PROT 7.3 6.8  ALBUMIN 3.8 3.5   Recent Labs  Lab 08/07/20 0650  LIPASE 25   No results for input(s): AMMONIA in the last 168 hours. Coagulation Profile: No results for input(s): INR, PROTIME in the last 168 hours. Cardiac Enzymes: No results for input(s): CKTOTAL, CKMB, CKMBINDEX, TROPONINI in the last 168 hours. BNP (last 3 results) No results for input(s): PROBNP in the last 8760 hours. HbA1C: No results for input(s): HGBA1C in the last 72 hours. CBG: No results for input(s): GLUCAP in the last 168 hours. Lipid Profile: No results for input(s): CHOL, HDL, LDLCALC, TRIG, CHOLHDL, LDLDIRECT in the last 72 hours. Thyroid Function Tests: No results for input(s): TSH, T4TOTAL, FREET4, T3FREE, THYROIDAB in the last 72 hours. Anemia Panel: No results for input(s): VITAMINB12, FOLATE, FERRITIN, TIBC, IRON, RETICCTPCT in the last 72 hours. Sepsis Labs: No results  for input(s): PROCALCITON, LATICACIDVEN in the last 168 hours.  Recent Results (from the past 240 hour(s))  SARS CORONAVIRUS 2 (TAT 6-24 HRS) Nasopharyngeal Nasopharyngeal Swab     Status: None   Collection Time: 08/07/20  4:46 PM   Specimen: Nasopharyngeal Swab  Result Value Ref Range Status   SARS Coronavirus 2 NEGATIVE NEGATIVE Final    Comment: (NOTE) SARS-CoV-2 target nucleic acids are NOT DETECTED.  The SARS-CoV-2 RNA is generally detectable in upper and lower respiratory specimens during the acute phase of infection. Negative results do not preclude SARS-CoV-2 infection, do not rule out co-infections with other pathogens, and should not be used as the sole basis for treatment or other patient management decisions. Negative results must be combined with clinical observations, patient history, and epidemiological information. The expected result is Negative.  Fact  Sheet for Patients: [employer]  Fact Sheet for Healthcare Providers: [employer]  This test is not yet approved or cleared by the [place] FDA and  has been authorized for detection and/or diagnosis of SARS-CoV-2 by FDA under an Emergency Use Authorization (EUA). This EUA will remain  in effect (meaning this test can be used) for the duration of the COVID-19 declaration under Se ction 564(b)(1) of the Act, 21 U.S.C. section 360bbb-3(b)(1), unless the authorization is terminated or revoked sooner.  Performed at [facility], [address] [place], [place] [address]          Radiology Studies: US Abdomen Complete  Result Date: 08/11/2020 CLINICAL DATA:  Abdominal pain EXAM: ABDOMEN ULTRASOUND COMPLETE COMPARISON:  07/30/2020 FINDINGS: Gallbladder: Gallbladder is well distended with multiple stones within. No wall thickening or pericholecystic fluid is noted. Common bile duct: Diameter: 4 mm. Liver: Slight increased echogenicity is  noted likely related to fatty infiltration. No focal mass is seen. Portal vein is patent on color Doppler imaging with normal direction of blood flow towards the liver. IVC: No abnormality visualized. Pancreas: Visualized portion unremarkable. Spleen: Size and appearance within normal limits. Right Kidney: Length: 11 cm. Echogenicity within normal limits. No mass or hydronephrosis visualized. Left Kidney: Length: 11.1 cm. Echogenicity within normal limits. No mass or hydronephrosis visualized. Abdominal aorta: No aneurysm visualized. Other findings: None. IMPRESSION: Fatty liver. Cholelithiasis without complicating factors. Electronically Signed   By: Alcide Clever M.D.   On: 08/11/2020 01:16        Scheduled Meds: . dicyclomine  10 mg Oral TID AC  . enoxaparin (LOVENOX) injection  40 mg Subcutaneous Q24H  . methylPREDNISolone (SOLU-MEDROL) injection  60 mg Intravenous Daily  . pantoprazole  40 mg Oral Daily   Continuous Infusions:   LOS: 5 days      Alwyn Ren, MD 08/12/2020, 11:06 AM

## 2020-08-12 NOTE — Progress Notes (Addendum)
Attending physician's note   I have taken an interval history, reviewed the chart and examined the patient. I agree with the Advanced Practitioner's note, impression, and recommendations as outlined.   I reviewed her recent evaluation, to include EGD in 02/2019, CT scan on 3/15, colonoscopy with biopsies on 3/22, along with serologic work-up.  Had a long conversation with the patient today. High suspicion for stricturing ileal Crohn's Disease.  While she initially reported some response to IV Solu-Medrol, today, she reports that she continues to have much of her index symptoms despite being on day 6 of IV steroids.  The suboptimal clinical response, along with normal inflammatory markers and endoscopic appearance are all highly concerning for a fixed, fibrotic ileal stricture that may not completely respond to medical therapy alone.    -CT enterography to evaluate for length of stricture, along with evaluation for inflammatory changes vs purely fixed, fibrotic stricture that would be more amenable to surgical intervention -Did discuss the possibility of colonoscopy with balloon dilation of a fibrotic stricture depending on CTE results.  Otherwise, repeat colonoscopy for diagnostic purposes likely will not add any significant value right now -Did discuss the need for continued biologic therapy even if surgical intervention for highly suspected Crohn's Disease. -Need to check HBsAb, HBaAg prior to starting any biologic therapy.  QuantiFERON gold otherwise negative.  Would need to check TPMT if starting concomitant immunomodulator -Additionally, could consider repeat EGD to evaluate for UGI Crohn's that may help shed some light on overall clinical picture -Sincerely appreciate the coordinated efforts by the Hospitalist service and consulting Surgical service. -GI service will continue to follow  Morgan Locks, DO, FACG [phone] office             Progress Note   Subjective  Chief  Complaint: Ileitis with stricture of the terminal ileum  Patient is somewhat angry today telling me that she only has childcare until Thursday and needs to figure out what is happening and get better before going home.  She has been googling Crohn's disease and thinks we need to do further imaging or repeat colonoscopy to see if anything is getting better.  Tells me she really does not feel better, not passing any stool since the day that she drank 3 prune juices in the morning and even then it was "super painful and not a lot".  Has started passing a small amount of gas overnight.  Wants to know what the plan is.  Currently on a soft diet with continued nausea but no vomiting for the past 24 hours.  Tells me she feels "full up".   Objective   Vital signs in last 24 hours: Temp:  [98.1 F (36.7 C)-99 F (37.2 C)] 98.1 F (36.7 C) (03/28 0454) Pulse Rate:  [57-71] 57 (03/28 0454) Resp:  [16-20] 16 (03/28 0454) BP: (133-145)/(94-98) 145/95 (03/28 0454) SpO2:  [99 %-100 %] 100 % (03/28 0454) Last BM Date: 08/09/20 General:   Overweight AA female in NAD Heart:  Regular rate and rhythm; no murmurs Lungs: Respirations even and unlabored, lungs CTA bilaterally Abdomen:  Soft, MOderate LLQ ttp and nondistended. Normal bowel sounds. Extremities:  Without edema. Neurologic:  Alert and oriented,  grossly normal neurologically. Psych:  Cooperative. Normal mood and affect.  Intake/Output from previous day: 03/27 0701 - 03/28 0700 In: 960 [P.O.:960] Out: -  Intake/Output this shift: Total I/O In: 480 [P.O.:480] Out: -   Lab Results: Recent Labs    08/10/20 0409 08/11/20 0314 08/12/20 0334  WBC 11.3* 8.3 8.6  HGB 10.0* 10.0* 9.9*  HCT 33.7* 32.9* 32.9*  PLT 284 278 270   BMET Recent Labs    08/10/20 0409 08/11/20 0314 08/12/20 0334  NA 136 138 136  K 3.9 3.6 3.9  CL 102 103 105  CO2 25 23 24   GLUCOSE 95 106* 113*  BUN 11 14 15   CREATININE 0.99 0.98 0.86  CALCIUM 9.5 9.4 9.2    Studies/Results: Abdomen Complete  Result Date: 08/11/2020 CLINICAL DATA:  Abdominal pain EXAM: ABDOMEN ULTRASOUND COMPLETE COMPARISON:  07/30/2020 FINDINGS: Gallbladder: Gallbladder is well distended with multiple stones within. No wall thickening or pericholecystic fluid is noted. Common bile duct: Diameter: 4 mm. Liver: Slight increased echogenicity is noted likely related to fatty infiltration. No focal mass is seen. Portal vein is patent on color Doppler imaging with normal direction of blood flow towards the liver. IVC: No abnormality visualized. Pancreas: Visualized portion unremarkable. Spleen: Size and appearance within normal limits. Right Kidney: Length: 11 cm. Echogenicity within normal limits. No mass or hydronephrosis visualized. Left Kidney: Length: 11.1 cm. Echogenicity within normal limits. No mass or hydronephrosis visualized. Abdominal aorta: No aneurysm visualized. Other findings: None. IMPRESSION: Fatty liver. Cholelithiasis without complicating factors. Electronically Signed   By: 08/13/2020 M.D.   On: 08/11/2020 01:16     Assessment / Plan:   Assessment: 1.  Ileitis: Thought to be most likely Crohn's, though biopsies are not consistent with this, also question of NSAID related, but no large improvements on day 6 of IV Solu-Medrol 60 mg, did start passing gas overnight but has not passed much stool, continues with some nausea 2.  Nausea and abdominal pain: With above  Plan: 1.  Will discuss case with Dr. Alcide Clever further today.  Patient is frustrated that we do not have a plan and she really is not getting much better.  Did have brief discussion with surgical PA who tells me that surgery is ready to proceed given the short area of stricturing whenever we are ready to stop therapy with the steroids. 2.  Agree with laxatives today to see if we can get her moving.  Hopefully this will help her feel better. 3.  Please await any further recommendations from Dr. 08/13/2020 later  today.  Thank you for your kind consultation, we will continue to follow.   LOS: 5 days   [name]  08/12/2020, 10:34 AM

## 2020-08-12 NOTE — Progress Notes (Signed)
CC: Abdominal pain worse with oral intake  Subjective: Patient says she has not been able to have a bowel movement in about 2 weeks.  She quit eating because of this.  She was started on a soft diet 08/09/2020.  She continues to have abdominal pain, she is not having bowel movements, pain is in the lower mid and right lower abdomen.  She is having nausea but no vomiting over the last 24 hours.  She does not think she is any better and informed the nurse that she wanted an MRI last evening.  Objective: Vital signs in last 24 hours: Temp:  [98.1 F (36.7 C)-99 F (37.2 C)] 98.1 F (36.7 C) (03/28 0454) Pulse Rate:  [57-71] 57 (03/28 0454) Resp:  [16-20] 16 (03/28 0454) BP: (133-145)/(94-98) 145/95 (03/28 0454) SpO2:  [99 %-100 %] 100 % (03/28 0454) Last BM Date: 08/09/20 960 p.o. recorded yesterday Voided x1 no other output recorded. Afebrile, vital signs are stable blood pressure somewhat elevated 145/95 3 AM. Glucose 113, WBC 8.6 H/H 9.9/32.9 CRP 0.7/sed rate 14 Acute hepatitis screens negative Abdominal ultrasound 08/11/2020, shows gallbladder is well distended with multiple stones no wall thickening or pericholecystic fluid CBD 4 mm CT 07/30/2020: Circumferential bowel wall thickening involving the terminal ileum with surrounding inflammatory changes and reactive adenopathy.  Overall findings are consistent with inflammatory bowel disease such as Crohn's, infectious ileitis less likely. Intake/Output from previous day: 03/27 0701 - 03/28 0700 In: 960 [P.O.:960] Out: -  Intake/Output this shift: No intake/output data recorded.  General appearance: alert, cooperative, no distress and Ongoing discomfort and pain. Resp: clear to auscultation bilaterally GI: Soft, she is not really tender and she is not really distended.  She complains of pain in the mid lower abdomen, more on the right side.  A Dulcolax suppository was ordered but she is questioning its use.  No BM.  Lab  Results:  Recent Labs    08/11/20 0314 08/12/20 0334  WBC 8.3 8.6  HGB 10.0* 9.9*  HCT 32.9* 32.9*  PLT 278 270    BMET Recent Labs    08/11/20 0314 08/12/20 0334  NA 138 136  K 3.6 3.9  CL 103 105  CO2 23 24  GLUCOSE 106* 113*  BUN 14 15  CREATININE 0.98 0.86  CALCIUM 9.4 9.2   PT/INR No results for input(s): LABPROT, INR in the last 72 hours.  Recent Labs  Lab 08/07/20 0650  AST 15  ALT 14  ALKPHOS 48  BILITOT 0.3  PROT 7.3  ALBUMIN 3.8     Lipase     Component Value Date/Time   LIPASE 25 08/07/2020 0650     Medications: . bisacodyl  10 mg Rectal Once  . dicyclomine  10 mg Oral TID AC  . enoxaparin (LOVENOX) injection  40 mg Subcutaneous Q24H  . methylPREDNISolone (SOLU-MEDROL) injection  60 mg Intravenous Daily  . pantoprazole  40 mg Oral Daily  Colonoscopy results 08/06/2020 : - The terminal ileum contained a benign-appearing, intrinsic severe stenosis measuring 6 mm (inner diameter) that was non-traversed with PCF H190 (peds colonoscope). The stricture appeared to be predominantly fibrotic with a small ulceration. IC valve was deformed. Multiple biopsies were taken with a cold forceps for histology (Jar 1). Findings: - The colon (entire examined portion) appeared normal with well preserved vascular pattern including cecum. Biopsies were taken with a cold forceps for histology from cecum, R colon (jar 2) and L colon (jar 3) - The perianal and  digital rectal examinations were normal. - The exam was otherwise without abnormality on direct and retroflexion views. [facility]  Dr. Lynann Bologna Prior to Admission medications   Medication Sig Start Date End Date Taking? Authorizing Provider  ciprofloxacin (CIPRO) 500 MG tablet Take 500 mg by mouth 2 (two) times daily. 08/01/20  Yes [provider]  HYDROcodone-acetaminophen (NORCO/VICODIN) 5-325 MG tablet Take 1 tablet by mouth every 4 (four) hours as needed for moderate pain. 07/30/20   Yes Pollina, Canary Brim, MD  metroNIDAZOLE (FLAGYL) 500 MG tablet Take 500 mg by mouth 2 (two) times daily. 08/01/20  Yes [provider]  ondansetron (ZOFRAN ODT) 4 MG disintegrating tablet Take 1 tablet (4 mg total) by mouth every 8 (eight) hours as needed for nausea or vomiting. 07/27/20  Yes Farrel Gordon, PA-C  PROAIR HFA 108 (90 Base) MCG/ACT inhaler Inhale 1 puff into the lungs every 4 (four) hours as needed for wheezing or shortness of breath. 06/11/20  Yes [provider]  promethazine (PHENERGAN) 25 MG tablet Take 25 mg by mouth every 6 (six) hours as needed for nausea or vomiting. 07/25/20  Yes [provider]  sulfaSALAzine (AZULFIDINE) 500 MG tablet Take 500 mg by mouth 4 (four) times daily. 08/01/20  Yes [provider]  zolpidem (AMBIEN) 10 MG tablet Take 10 mg by mouth at bedtime as needed for sleep. 01/09/20  Yes [provider]  dicyclomine (BENTYL) 20 MG tablet Take 1 tablet (20 mg total) by mouth every 8 (eight) hours as needed for spasms. Patient not taking: Reported on 08/07/2020 01/10/20   Napoleon Form, MD  methocarbamol (ROBAXIN) 500 MG tablet Take 1 tablet (500 mg total) by mouth 2 (two) times daily. 07/27/20   Farrel Gordon, PA-C  omeprazole (PRILOSEC) 40 MG capsule Take 1 capsule (40 mg total) by mouth daily. 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 Loreta Ave  FEN:  Soft diet ID:  None GI:  Protonix/Zofran/solumedrol/dilaudid/dicyclomine DVT: Lovenox  Plan: Patient does not think she has improved any since admission.  She essentially wants resolution of her problem.  She has been on steroids since 08/07/2020,  without improvement in her opinion.  She is requesting an MRI.  Will discuss with Dr. Doylene Canard.  We will also need to discuss with Dr. Loreta Ave and determine how long Dr. Loreta Ave thinks we should continue with steroids.  I will add LFTs to a.m. labs.  CBC, prealbumin, INR to a.m. labs tomorrow.    LOS: 5 days    JENNINGS,WILLARD 08/12/2020 Please see Amion

## 2020-08-13 LAB — CBC
HCT: 32.2 % — ABNORMAL LOW (ref 36.0–46.0)
Hemoglobin: 9.4 g/dL — ABNORMAL LOW (ref 12.0–15.0)
MCH: 22 pg — ABNORMAL LOW (ref 26.0–34.0)
MCHC: 29.2 g/dL — ABNORMAL LOW (ref 30.0–36.0)
MCV: 75.2 fL — ABNORMAL LOW (ref 80.0–100.0)
Platelets: 284 10*3/uL (ref 150–400)
RBC: 4.28 MIL/uL (ref 3.87–5.11)
RDW: 18.9 % — ABNORMAL HIGH (ref 11.5–15.5)
WBC: 9.2 10*3/uL (ref 4.0–10.5)
nRBC: 0 % (ref 0.0–0.2)

## 2020-08-13 LAB — PROTIME-INR
INR: 1.2 (ref 0.8–1.2)
Prothrombin Time: 14.6 seconds (ref 11.4–15.2)

## 2020-08-13 LAB — HEPATITIS B SURFACE ANTIBODY,QUALITATIVE: Hep B S Ab: REACTIVE — AB

## 2020-08-13 LAB — PREALBUMIN: Prealbumin: 24.4 mg/dL (ref 18–38)

## 2020-08-13 MED ORDER — HYDROMORPHONE HCL 1 MG/ML IJ SOLN
0.5000 mg | INTRAMUSCULAR | Status: DC | PRN
  Administered 2020-08-13 – 2020-08-14 (×2): 0.5 mg via INTRAVENOUS
  Filled 2020-08-13 (×2): qty 0.5

## 2020-08-13 MED ORDER — HYDROCODONE-ACETAMINOPHEN 5-325 MG PO TABS
1.0000 | ORAL_TABLET | ORAL | Status: DC | PRN
Start: 2020-08-13 — End: 2020-08-14
  Administered 2020-08-13: 1 via ORAL
  Filled 2020-08-13 (×2): qty 1

## 2020-08-13 MED ORDER — PREDNISONE 20 MG PO TABS: 60.0000 mg | ORAL_TABLET | Freq: Every day | ORAL | Status: DC

## 2020-08-13 MED ORDER — BISACODYL 10 MG RE SUPP
10.0000 mg | Freq: Once | RECTAL | Status: AC
  Administered 2020-08-13: 10 mg via RECTAL
  Filled 2020-08-13: qty 1

## 2020-08-13 NOTE — Progress Notes (Addendum)
Attending physician's note   I have taken an interval history, reviewed the chart and examined the patient. I agree with the Advanced Practitioner's note, impression, and recommendations as outlined.   I reviewed the results of the CT enterography with the Radiologist.  Reviewed those results at length with the patient today.  Appears to have short segment thickening in the distal small bowel/TI, but no small bowel dilation.  Appears to be more consistent with inflammatory changes rather than fixed, fibrotic stricture.  Discussed small percentage of Crohn's patients that do not mount serologic inflammatory response (patient had normal ESR, CRP).  She otherwise reports feeling overall better.  Some mild lower abdominal pain, typically postprandial.  Otherwise no nausea/vomiting.  We a long discussion today regarding medical management of Crohn's disease along with possibility of surgical intervention.  We discussed treatment options to include biologic, immunomodulator, and dual "top-down" therapy, and she elected to proceed with the latter.  -Transition Solu-Medrol to prednisone 60 mg/day with plan for slow taper.  60 mg x 2 weeks then 50 mg x 7 days, 40 mg x 7 days, and continue decreasing by 10 mg every 7 days until off -Will schedule follow-up in the GI clinic in 2-4 weeks -Check TPMT in anticipation of dual therapy -Tentative plan for infliximab (or Inflectra) + 6-MP as outpatient -To slowly advance diet as outpatient -Provided no acute events overnight and tolerating oral prednisone, anticipate discharge tomorrow morning  [place], DO, FACG [phone] office              [facility] Progress Note  CC:  Ileitis, abdominal pain  Subjective:  Still complains of lower abdominal pain on the left and into her low back.  Nursing staff reports that she is eating a lot at her meals.  She still has a lot of questions regarding her plan of care, etc.  A little nausea  when she has pain.  Had some small BMs yesterday and today after suppositories.  Objective:  Vital signs in last 24 hours: Temp:  [97.1 F (36.2 C)-98.6 F (37 C)] 98.5 F (36.9 C) (03/29 0232) Pulse Rate:  [64-83] 83 (03/29 0232) Resp:  [15-18] 18 (03/29 0232) BP: (135-156)/(84-105) 135/95 (03/29 0232) SpO2:  [96 %-99 %] 99 % (03/29 0232) Last BM Date: 08/09/20 General:  Alert, Well-developed, in NAD Heart:  Regular rate and rhythm; no murmurs Pulm:  CTAB.  No W/R/R. Abdomen:  Soft, non-distended.  BS present.  Non-tender. Extremities:  Without edema. Neurologic:  Alert and oriented x 4;  grossly normal neurologically. Psych:  Alert and cooperative. Normal mood and affect.  Intake/Output from previous day: 03/28 0701 - 03/29 0700 In: 480 [P.O.:480] Out: -   Lab Results: Recent Labs    08/11/20 0314 08/12/20 0334 08/13/20 0347  WBC 8.3 8.6 9.2  HGB 10.0* 9.9* 9.4*  HCT 32.9* 32.9* 32.2*  PLT 278 270 284   BMET Recent Labs    08/11/20 0314 08/12/20 0334  NA 138 136  K 3.6 3.9  CL 103 105  CO2 23 24  GLUCOSE 106* 113*  BUN 14 15  CREATININE 0.98 0.86  CALCIUM 9.4 9.2   LFT Recent Labs    08/12/20 0334  PROT 6.8  ALBUMIN 3.5  AST 18  ALT 25  ALKPHOS 40  BILITOT 0.4  BILIDIR <0.1  IBILI NOT CALCULATED   PT/INR Recent Labs    08/13/20 0347  LABPROT 14.6  INR 1.2   CT ENTERO ABD/PELVIS W  CONTAST  Result Date: 08/12/2020 CLINICAL DATA:  Follow-up for possible Crohn's disease. EXAM: CT ABDOMEN AND PELVIS WITH CONTRAST (ENTEROGRAPHY) TECHNIQUE: Multidetector CT of the abdomen and pelvis during bolus administration of intravenous contrast. Negative oral contrast was given. CONTRAST:  153m OMNIPAQUE IOHEXOL 300 MG/ML  SOLN COMPARISON:  CT abdomen pelvis July 30, 2020. FINDINGS: Lower chest:  No acute abnormality. Hepatobiliary: No suspicious hepatic lesions. Gallbladder is unremarkable. No biliary ductal dilation. Pancreas: No mass, inflammatory  changes, or other significant abnormality. Spleen: Within normal limits in size and appearance. Adrenals/Urinary Tract: No masses identified. No evidence of hydronephrosis. Stomach/Bowel: Stomach is distended with ingested negative oral contrast, without evidence of suspicious wall thickening. Minimal low-density filling of the small bowel. No small bowel dilation. There is similar low-density small bowel wall thickening with adjacent inflammatory stranding involving the terminal ileum. The appendix is normal appearance. Short segment region of colonic wall thickening on image 64/6, however the colon is decompressed in this location and appeared normal on recent prior. The distal colon is predominantly decompressed limiting evaluation. Scattered colonic diverticulosis without findings of acute diverticulitis. Vascular/Lymphatic: Interval decrease in size of the prominent right lower quadrant lymph nodes now measuring 6 mm in maximum axial diameter on image 45/2 previously. Splenules versus prominent lymph nodes in the left upper quadrant and along the left pericolic gutter measuring up to 5 mm appears similar to prior. Reproductive: No mass or other significant abnormality. Other: Trace pelvic free fluid, likely reactive. Musculoskeletal: No suspicious bone lesions identified. IMPRESSION: 1. There is similar low-density small bowel wall thickening with adjacent inflammatory stranding involving the terminal ileum, likely representing Crohn's disease. 2. Interval decrease in size of the prominent right lower quadrant lymph nodes, now measuring 6 mm in maximum axial diameter. 3. Splenules versus prominent lymph nodes in the left upper quadrant and along the pericolic gutter measuring up to 5 mm appears similar to prior. 4. Trace pelvic free fluid, likely reactive. Electronically Signed   By: JDahlia BailiffMD   On: 08/12/2020 22:28   Assessment / Plan: 1.  Ileitis: Thought to be most likely stricturing ileal  Crohn's.  CT enterography yesterday showed similar low-density small bowel wall thickening with adjacent inflammatory stranding involving the terminal ileum likely representing Crohn's disease. 2.  Nausea and abdominal pain: With above.  -Will have Dr. CBryan Lemmadiscuss plan further.  ? Give steroids more time vs surgery vs CTE with balloon dilation. -All labs have been ordered for future biologic use except TPMT has not been ordered (I can do so if Dr. CBryan Lemmawould like it done). -Continue soft diet.  She would like a dietician to see her towards the end of her hospital stay.   LOS: 6 days   JLaban Emperor Zehr  08/13/2020, 9:45 AM

## 2020-08-13 NOTE — Progress Notes (Signed)
[facility] Progress Note     Subjective: CC-  Continues to have intermittent left lower abdominal pain. She reports that she gets nauseated when she has pain, no emesis. Tolerating diet without n/v. She had a small BM this morning after suppository.  WBC 9.2, afebrile.   Objective: Vital signs in last 24 hours: Temp:  [97.1 F (36.2 C)-98.6 F (37 C)] 98.5 F (36.9 C) (03/29 0232) Pulse Rate:  [64-83] 83 (03/29 0232) Resp:  [15-18] 18 (03/29 0232) BP: (135-156)/(84-105) 135/95 (03/29 0232) SpO2:  [96 %-99 %] 99 % (03/29 0232) Last BM Date: 08/09/20  Intake/Output from previous day: 03/28 0701 - 03/29 0700 In: 480 [P.O.:480] Out: -  Intake/Output this shift: Total I/O In: 240 [P.O.:240] Out: -   PE: Gen:  Alert, NAD, pleasant Pulm:  rate and effort normal Abd: Soft, ND, mild subjective LLQ TTP without rebound or guarding Skin: no rashes noted, warm and dry  Lab Results:  Recent Labs    08/12/20 0334 08/13/20 0347  WBC 8.6 9.2  HGB 9.9* 9.4*  HCT 32.9* 32.2*  PLT 270 284   BMET Recent Labs    08/11/20 0314 08/12/20 0334  NA 138 136  K 3.6 3.9  CL 103 105  CO2 23 24  GLUCOSE 106* 113*  BUN 14 15  CREATININE 0.98 0.86  CALCIUM 9.4 9.2   PT/INR Recent Labs    08/13/20 0347  LABPROT 14.6  INR 1.2   CMP     Component Value Date/Time   NA 136 08/12/2020 0334   K 3.9 08/12/2020 0334   CL 105 08/12/2020 0334   CO2 24 08/12/2020 0334   GLUCOSE 113 (H) 08/12/2020 0334   BUN 15 08/12/2020 0334   CREATININE 0.86 08/12/2020 0334   CALCIUM 9.2 08/12/2020 0334   PROT 6.8 08/12/2020 0334   ALBUMIN 3.5 08/12/2020 0334   AST 18 08/12/2020 0334   ALT 25 08/12/2020 0334   ALKPHOS 40 08/12/2020 0334   BILITOT 0.4 08/12/2020 0334   GFRNONAA >60 08/12/2020 0334   GFRAA >60 01/27/2019 1538   Lipase     Component Value Date/Time   LIPASE 25 08/07/2020 0650       Studies/Results: CT ENTERO ABD/PELVIS W CONTAST  Result Date:  08/12/2020 CLINICAL DATA:  Follow-up for possible Crohn's disease. EXAM: CT ABDOMEN AND PELVIS WITH CONTRAST (ENTEROGRAPHY) TECHNIQUE: Multidetector CT of the abdomen and pelvis during bolus administration of intravenous contrast. Negative oral contrast was given. CONTRAST:  OMNIPAQUE IOHEXOL 300 MG/ML  SOLN COMPARISON:  CT abdomen pelvis July 30, 2020. FINDINGS: Lower chest:  No acute abnormality. Hepatobiliary: No suspicious hepatic lesions. Gallbladder is unremarkable. No biliary ductal dilation. Pancreas: No mass, inflammatory changes, or other significant abnormality. Spleen: Within normal limits in size and appearance. Adrenals/Urinary Tract: No masses identified. No evidence of hydronephrosis. Stomach/Bowel: Stomach is distended with ingested negative oral contrast, without evidence of suspicious wall thickening. Minimal low-density filling of the small bowel. No small bowel dilation. There is similar low-density small bowel wall thickening with adjacent inflammatory stranding involving the terminal ileum. The appendix is normal appearance. Short segment region of colonic wall thickening on image 64/6, however the colon is decompressed in this location and appeared normal on recent prior. The distal colon is predominantly decompressed limiting evaluation. Scattered colonic diverticulosis without findings of acute diverticulitis. Vascular/Lymphatic: Interval decrease in size of the prominent right lower quadrant lymph nodes now measuring 6 mm in maximum axial diameter on image 45/2 previously.  Splenules versus prominent lymph nodes in the left upper quadrant and along the left pericolic gutter measuring up to 5 mm appears similar to prior. Reproductive: No mass or other significant abnormality. Other: Trace pelvic free fluid, likely reactive. Musculoskeletal: No suspicious bone lesions identified. IMPRESSION: 1. There is similar low-density small bowel wall thickening with adjacent inflammatory  stranding involving the terminal ileum, likely representing Crohn's disease. 2. Interval decrease in size of the prominent right lower quadrant lymph nodes, now measuring 6 mm in maximum axial diameter. 3. Splenules versus prominent lymph nodes in the left upper quadrant and along the pericolic gutter measuring up to 5 mm appears similar to prior. 4. Trace pelvic free fluid, likely reactive. 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 GI following, currently on steroids - CT enterography 3/28 showed similar low-density small bowel wall thickening with adjacent inflammatory stranding involving the terminal ileum, likely representing Crohn's disease - Continues to have intermittent pain. Tolerating diet without n/v, having bowel function with assistance of suppository. Ideally will be able to manage without surgical intervention, but she is frustrated that she's still not fully better. Will await GI recs following CT enterography.   FEN:  Soft diet ID:  None VTE: Lovenox   LOS: 6 days    Franne Forts, [facility] Surgery 08/13/2020, 11:54 AM Please see Amion for pager number during day hours 7:00am-4:30pm

## 2020-08-13 NOTE — Progress Notes (Addendum)
PROGRESS NOTE    Morgan Chung  JXB:[id number] DOB: 11-05-1985 DOA: 08/07/2020 PCP: [name], MD    Brief Narrative: 35 year old female with a past medical history of anemia, asthma, gastritis, GERD, insomnia has been seen in the ED multiple times for abdominal pain recently who presented to the ED this morning after undergoing colonoscopy yesterday for evaluation of upper abdominal pain.  CT abdomen pelvis on 3/15 showed circumferential thickening of the terminal ileum with surrounding fat stranding concerning for IBD and noted RLQ lymphadenopathy.  Colonoscopy yesterday showed normal-appearing anus rectum and colon with a tight stricture in the terminal ileum and deformed ileocecal valve.  Has since had increasing abdominal pain last night post procedure and after eating dinner which was pork and rice.  Denies any known family history of IBD.  Admits to nausea but otherwise denies any bowel movements, vomiting or any other complaints.  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   She has been receiving Dilaudid overnight for abdominal pain.  She reports she is not feeling any better.  She had a small BM 08/13/2020 with Dulcolax suppository. She does not want Colace senna Dulcolax lactulose etc. She complains of cramps in the lower to mid abdomen.  She is passing flatus. She is status post colonoscopy 08/06/2020.  Colonoscopy with findings of tight stricture in the terminal ileum and deformed ileocecal valve.  CT abdomen 2/15/ 2022 shows circumferential bowel wall thickening of the terminal ileum. She continues on IV Solu-Medrol 60 mg daily. She is on a regular diet which she is eating and tolerating except for complains of increased abdominal pain with  eating. Biopsy consistent with chronic active ileitis Appreciate surgery and GI input. Abdominal ultrasound shows gallstones but no evidence of inflammation. Continue PPI. Advised her to get out of bed ambulate. CT enterography 08/12/2020-6 low-density small bowel wall thickening with adjacent inflammatory stranding involving the terminal ileum likely representing Crohn's disease. Await GI recommendations.  Addendum patient has been started on prednisone 60 mg daily starting today.  Monitor her response.  If she remains stable overnight with no increase in pain consider discharge tomorrow after seen by GI.   Estimated body mass index is 34.28 kg/m as calculated from the following:   Height as of this encounter: 5\' 5"  (1.651 m).   Weight as of this encounter: 93.4 kg.  DVT prophylaxis: Lovenox Code Status: Full code Family Communication: None at bedside  disposition Plan:  Status is: Inpatient  Dispo: The patient is from: Home              Anticipated d/c is to: Home              Patient currently is not medically stable to d/c.   Difficult to place patient na   Consultants: gi  Procedures: none Antimicrobials:none  Subjective: Patient still complaining of abdominal pain after eating no nausea vomiting had a BM today and he had a BM yesterday after the Dulcolax suppositories. She has a heat pad on her abdomen.  She is kind of frustrated that she cannot go home. Objective: Vitals:   08/12/20 0454 08/12/20 1403 08/12/20 2030 08/13/20 0232  BP: (!) 145/95 138/84 (!) 156/105 (!) 135/95  Pulse: (!) 57 64 72 83  Resp:  16 16 15 18   Temp: 98.1 F (36.7 C) (!) 97.1 F (36.2 C) 98.6 F (37 C) 98.5 F (36.9 C)  TempSrc: Oral  Oral Oral  SpO2: 100% 96% 99% 99%  Weight:      Height:        Intake/Output Summary (Last 24 hours) at 08/13/2020 1209 Last data filed at 08/13/2020 0900 Gross per 24 hour  Intake 240 ml  Output --  Net 240 ml   Filed Weights   08/07/20 0617   Weight: 93.4 kg    Examination:  General exam: Appears calm and comfortable  Respiratory system: Clear to auscultation. Respiratory effort normal. Cardiovascular system: S1 & S2 heard, RRR. No JVD, murmurs, rubs, gallops or clicks. No pedal edema. Gastrointestinal system: Abdomen is nondistended, soft and nontender. No organomegaly or masses felt. Normal bowel sounds heard. Central nervous system: Alert and oriented. No focal neurological deficits. Extremities: Symmetric 5 x 5 power. Skin: No rashes, lesions or ulcers Psychiatry: Judgement and insight appear normal. Mood & affect appropriate.     Data Reviewed: I have personally reviewed following labs and imaging studies  CBC: Recent Labs  Lab 08/07/20 0650 08/08/20 0322 08/09/20 0420 08/10/20 0409 08/11/20 0314 08/12/20 0334 08/13/20 0347  WBC 5.1   < > 11.1* 11.3* 8.3 8.6 9.2  NEUTROABS 3.4  --   --   --   --   --   --   HGB 10.4*   < > 9.4* 10.0* 10.0* 9.9* 9.4*  HCT 35.5*   < > 32.5* 33.7* 32.9* 32.9* 32.2*  MCV 74.9*   < > 75.6* 75.2* 74.4* 74.4* 75.2*  PLT 228   < > 258 284 278 270 284   < > = values in this interval not displayed.   Basic Metabolic Panel: Recent Labs  Lab 08/08/20 0322 08/09/20 0420 08/10/20 0409 08/11/20 0314 08/12/20 0334  NA 138 136 136 138 136  K 4.4 4.2 3.9 3.6 3.9  CL 110 106 102 103 105  CO2 19* 22 25 23 24   GLUCOSE 117* 95 95 106* 113*  BUN <5* 9 11 14 15   CREATININE 0.86 0.93 0.99 0.98 0.86  CALCIUM 9.2 9.2 9.5 9.4 9.2   GFR: Estimated Creatinine Clearance: 105.2 mL/min (by C-G formula based on SCr of 0.86 mg/dL). Liver Function Tests: Recent Labs  Lab 08/07/20 0650 08/12/20 0334  AST 15 18  ALT 14 25  ALKPHOS 48 40  BILITOT 0.3 0.4  PROT 7.3 6.8  ALBUMIN 3.8 3.5   Recent Labs  Lab 08/07/20 0650  LIPASE 25   No results for input(s): AMMONIA in the last 168 hours. Coagulation Profile: Recent Labs  Lab 08/13/20 0347  INR 1.2   Cardiac Enzymes: No results  for input(s): CKTOTAL, CKMB, CKMBINDEX, TROPONINI in the last 168 hours. BNP (last 3 results) No results for input(s): PROBNP in the last 8760 hours. HbA1C: No results for input(s): HGBA1C in the last 72 hours. CBG: No results for input(s): GLUCAP in the last 168 hours. Lipid Profile: No results for input(s): CHOL, HDL, LDLCALC, TRIG, CHOLHDL, LDLDIRECT in the last 72 hours. Thyroid Function Tests: No results for input(s): TSH, T4TOTAL, FREET4, T3FREE, THYROIDAB in the last 72 hours. Anemia Panel: No results for input(s): VITAMINB12, FOLATE, FERRITIN, TIBC, IRON, RETICCTPCT in the last 72 hours. Sepsis Labs: No results for input(s): PROCALCITON, LATICACIDVEN in the last 168 hours.  Recent Results (from the past 240 hour(s))  SARS CORONAVIRUS 2 (TAT 6-24 HRS) Nasopharyngeal  Nasopharyngeal Swab     Status: None   Collection Time: 08/07/20  4:46 PM   Specimen: Nasopharyngeal Swab  Result Value Ref Range Status   SARS Coronavirus 2 NEGATIVE NEGATIVE Final    Comment: (NOTE) SARS-CoV-2 target nucleic acids are NOT DETECTED.  The SARS-CoV-2 RNA is generally detectable in upper and lower respiratory specimens during the acute phase of infection. Negative results do not preclude SARS-CoV-2 infection, do not rule out co-infections with other pathogens, and should not be used as the sole basis for treatment or other patient management decisions. Negative results must be combined with clinical observations, patient history, and epidemiological information. The expected result is Negative.  Fact Sheet for Patients: HairSlick.nohttps://www.fda.gov/media/138098/download  Fact Sheet for Healthcare Providers: quierodirigir.comhttps://www.fda.gov/media/138095/download  This test is not yet approved or cleared by the [place] FDA and  has been authorized for detection and/or diagnosis of SARS-CoV-2 by FDA under an Emergency Use Authorization (EUA). This EUA will remain  in effect (meaning this test can be used) for  the duration of the COVID-19 declaration under Se ction 564(b)(1) of the Act, 21 U.S.C. section 360bbb-3(b)(1), unless the authorization is terminated or revoked sooner.  Performed at [facility], [address] [place], [place] [address]          Radiology Studies: CT ENTERO ABD/PELVIS W CONTAST  Result Date: 08/12/2020 CLINICAL DATA:  Follow-up for possible Crohn's disease. EXAM: CT ABDOMEN AND PELVIS WITH CONTRAST (ENTEROGRAPHY) TECHNIQUE: Multidetector CT of the abdomen and pelvis during bolus administration of intravenous contrast. Negative oral contrast was given. CONTRAST:  125mL OMNIPAQUE IOHEXOL 300 MG/ML  SOLN COMPARISON:  CT abdomen pelvis July 30, 2020. FINDINGS: Lower chest:  No acute abnormality. Hepatobiliary: No suspicious hepatic lesions. Gallbladder is unremarkable. No biliary ductal dilation. Pancreas: No mass, inflammatory changes, or other significant abnormality. Spleen: Within normal limits in size and appearance. Adrenals/Urinary Tract: No masses identified. No evidence of hydronephrosis. Stomach/Bowel: Stomach is distended with ingested negative oral contrast, without evidence of suspicious wall thickening. Minimal low-density filling of the small bowel. No small bowel dilation. There is similar low-density small bowel wall thickening with adjacent inflammatory stranding involving the terminal ileum. The appendix is normal appearance. Short segment region of colonic wall thickening on image 64/6, however the colon is decompressed in this location and appeared normal on recent prior. The distal colon is predominantly decompressed limiting evaluation. Scattered colonic diverticulosis without findings of acute diverticulitis. Vascular/Lymphatic: Interval decrease in size of the prominent right lower quadrant lymph nodes now measuring 6 mm in maximum axial diameter on image 45/2 previously. Splenules versus prominent lymph nodes in the left upper quadrant and along the  left pericolic gutter measuring up to 5 mm appears similar to prior. Reproductive: No mass or other significant abnormality. Other: Trace pelvic free fluid, likely reactive. Musculoskeletal: No suspicious bone lesions identified. IMPRESSION: 1. There is similar low-density small bowel wall thickening with adjacent inflammatory stranding involving the terminal ileum, likely representing Crohn's disease. 2. Interval decrease in size of the prominent right lower quadrant lymph nodes, now measuring 6 mm in maximum axial diameter. 3. Splenules versus prominent lymph nodes in the left upper quadrant and along the pericolic gutter measuring up to 5 mm appears similar to prior. 4. Trace pelvic free fluid, likely reactive. Electronically Signed   By: Maudry MayhewJeffrey  Waltz MD   On: 08/12/2020 22:28        Scheduled Meds: . dicyclomine  10 mg Oral TID AC  . enoxaparin (LOVENOX)  injection  40 mg Subcutaneous Q24H  . methylPREDNISolone (SOLU-MEDROL) injection  60 mg Intravenous Daily  . pantoprazole  40 mg Oral Daily   Continuous Infusions:   LOS: 6 days      Alwyn Ren, MD 08/13/2020, 12:09 PM

## 2020-08-14 ENCOUNTER — Ambulatory Visit: Payer: Medicaid Other | Admitting: Physician Assistant

## 2020-08-14 ENCOUNTER — Telehealth: Payer: Self-pay | Admitting: Physician Assistant

## 2020-08-14 DIAGNOSIS — K50019 Crohn's disease of small intestine with unspecified complications: Secondary | ICD-10-CM

## 2020-08-14 DIAGNOSIS — K2950 Unspecified chronic gastritis without bleeding: Secondary | ICD-10-CM

## 2020-08-14 DIAGNOSIS — R030 Elevated blood-pressure reading, without diagnosis of hypertension: Secondary | ICD-10-CM

## 2020-08-14 DIAGNOSIS — R109 Unspecified abdominal pain: Secondary | ICD-10-CM

## 2020-08-14 LAB — CREATININE, SERUM
Creatinine, Ser: 1.08 mg/dL — ABNORMAL HIGH (ref 0.44–1.00)
GFR, Estimated: >60 mL/min (ref >60)

## 2020-08-14 MED ORDER — PREDNISONE 20 MG PO TABS
60.0000 mg | ORAL_TABLET | Freq: Every day | ORAL | Status: DC
  Administered 2020-08-14: 60 mg via ORAL
  Filled 2020-08-14: qty 3

## 2020-08-14 MED ORDER — PREDNISONE 10 MG PO TABS: ORAL_TABLET | ORAL | 0 refills | Status: DC

## 2020-08-14 MED ORDER — AMLODIPINE BESYLATE 5 MG PO TABS
5.0000 mg | ORAL_TABLET | Freq: Every day | ORAL | Status: DC
  Administered 2020-08-14: 5 mg via ORAL
  Filled 2020-08-14: qty 1

## 2020-08-14 MED ORDER — AMLODIPINE BESYLATE 5 MG PO TABS: 5.0000 mg | ORAL_TABLET | Freq: Every day | ORAL | 1 refills | Status: DC

## 2020-08-14 MED ORDER — HYOSCYAMINE SULFATE 0.125 MG PO TABS: 0.1250 mg | ORAL_TABLET | Freq: Four times a day (QID) | ORAL | 0 refills | Status: DC | PRN

## 2020-08-14 NOTE — Progress Notes (Signed)
[facility] ROUNDING NOTE   Subjective: No acute events overnight.  Patient requesting to be seen before discharge today to discuss Crohn's disease and continued management again.  Otherwise, she is anticipating discharge home.   Objective: Vital signs in last 24 hours: Temp:  [98.1 F (36.7 C)-98.3 F (36.8 C)] 98.3 F (36.8 C) (03/30 1401) Pulse Rate:  [56-64] 63 (03/30 1401) Resp:  [18] 18 (03/30 1401) BP: (131-154)/(82-104) 138/86 (03/30 1401) SpO2:  [100 %] 100 % (03/30 1401) Last BM Date: 08/13/20 General: NAD, well conversive   Intake/Output from previous day: 03/29 0701 - 03/30 0700 In: 840 [P.O.:840] Out: -  Intake/Output this shift: No intake/output data recorded.   Lab Results: Recent Labs    08/12/20 0334 08/13/20 0347  WBC 8.6 9.2  HGB 9.9* 9.4*  PLT 270 284  MCV 74.4* 75.2*   BMET Recent Labs    08/12/20 0334 08/14/20 0354  NA 136  --   K 3.9  --   CL 105  --   CO2 24  --   GLUCOSE 113*  --   BUN 15  --   CREATININE 0.86 1.08*  CALCIUM 9.2  --    LFT Recent Labs    08/12/20 0334  PROT 6.8  ALBUMIN 3.5  AST 18  ALT 25  ALKPHOS 40  BILITOT 0.4  BILIDIR <0.1  IBILI NOT CALCULATED   PT/INR Recent Labs    08/13/20 0347  INR 1.2      Imaging/Other results: CT ENTERO ABD/PELVIS W CONTAST  Result Date: 08/12/2020 CLINICAL DATA:  Follow-up for possible Crohn's disease. EXAM: CT ABDOMEN AND PELVIS WITH CONTRAST (ENTEROGRAPHY) TECHNIQUE: Multidetector CT of the abdomen and pelvis during bolus administration of intravenous contrast. Negative oral contrast was given. CONTRAST:  OMNIPAQUE IOHEXOL 300 MG/ML  SOLN COMPARISON:  CT abdomen pelvis July 30, 2020. FINDINGS: Lower chest:  No acute abnormality. Hepatobiliary: No suspicious hepatic lesions. Gallbladder is unremarkable. No biliary ductal dilation. Pancreas: No mass, inflammatory changes, or other significant abnormality. Spleen: Within normal limits in size and  appearance. Adrenals/Urinary Tract: No masses identified. No evidence of hydronephrosis. Stomach/Bowel: Stomach is distended with ingested negative oral contrast, without evidence of suspicious wall thickening. Minimal low-density filling of the small bowel. No small bowel dilation. There is similar low-density small bowel wall thickening with adjacent inflammatory stranding involving the terminal ileum. The appendix is normal appearance. Short segment region of colonic wall thickening on image 64/6, however the colon is decompressed in this location and appeared normal on recent prior. The distal colon is predominantly decompressed limiting evaluation. Scattered colonic diverticulosis without findings of acute diverticulitis. Vascular/Lymphatic: Interval decrease in size of the prominent right lower quadrant lymph nodes now measuring 6 mm in maximum axial diameter on image 45/2 previously. Splenules versus prominent lymph nodes in the left upper quadrant and along the left pericolic gutter measuring up to 5 mm appears similar to prior. Reproductive: No mass or other significant abnormality. Other: Trace pelvic free fluid, likely reactive. Musculoskeletal: No suspicious bone lesions identified. IMPRESSION: 1. There is similar low-density small bowel wall thickening with adjacent inflammatory stranding involving the terminal ileum, likely representing Crohn's disease. 2. Interval decrease in size of the prominent right lower quadrant lymph nodes, now measuring 6 mm in maximum axial diameter. 3. Splenules versus prominent lymph nodes in the left upper quadrant and along the pericolic gutter measuring up to 5 mm appears similar to prior. 4. Trace pelvic free fluid, likely reactive. Electronically Signed  By: Maudry Mayhew MD   On: 08/12/2020 22:28      Assessment and Plan:  1) Crohn's disease -Continue prednisone 60 mg/daily x2 weeks, then reduce by 10 mg every 7 days -Follow-up in GI clinic as  scheduled -TPMT pending in anticipation of stool "top-down" therapy with 6-MP and infliximab (or Inflectra) -Continue advancing diet slowly at home -Okay to use MiraLAX at home if needed -Discharged with Levsin to use prn abdominal spasm -All questions answered to the best my ability and okay for discharge today    Shellia Cleverly, DO  08/14/2020, 3:31 PM [facility] Pager [phone]

## 2020-08-14 NOTE — Progress Notes (Signed)
Leadership alerted by pt nurse of patient concerns regarding plan of care/  Leadership rounded with patient who voiced concerns of not having normal BM without suppository since admit, discharge planning, and follow up on stricture.  Leadership acknowledged concerns, reviewed MD notes with primary RN at bedside with patient.  Service recovery attempted.  Leadership to alert Dr Alanda Slim of patient concerns and requests.

## 2020-08-14 NOTE — Progress Notes (Signed)
Leadership spoke with Dr Alanda Slim regarding pt concerns, MD will alert GI and they will follow up with patient again this afternoon.  Pt alerted that GI MD will visit her today to address plan of care concerns and questions, and if feasible, she could still be discharged by Dr Alanda Slim today.  Patient verbalized understanding and agreed to next steps.

## 2020-08-14 NOTE — Plan of Care (Signed)

## 2020-08-14 NOTE — Progress Notes (Signed)
Patient is requesting Dietary Consult for recommendations.  Patient has question regarding pain management diet and plan of care.  Writer requested Patient discuss plan/prognosis with MD.  Writer encouraged ambulation and PO meds.  Patient requested IV pain med to reduce pain enough so she could sleep.

## 2020-08-14 NOTE — Telephone Encounter (Signed)
Returned patients call letting her know that she is on the our hospital list meaning that someone from the practice will be by. Most likely Doug Sou, [place] .

## 2020-08-14 NOTE — Discharge Summary (Signed)
Physician Discharge Summary  Morgan Chung LTJ:[id number] DOB: Oct 13, 1985 DOA: 08/07/2020  PCP: [name], MD  Admit date: 08/07/2020 Discharge date: 08/14/2020  Admitted From: Home Disposition: Home  Recommendations for Outpatient Follow-up:  1. Follow ups as below. 2. Please obtain CBC/BMP/Mag at follow up 3. Please follow up on the following pending results: None  Home Health: None Equipment/Devices: None  Discharge Condition: Stable CODE STATUS: Full code   Follow-up Information    Unk Lightning, PA Follow up on 08/27/2020.   Specialty: Gastroenterology Why: 11:00AM Contact information: [address] [place] [address] [phone]        [name], MD. Schedule an appointment as soon as possible for a visit in 1 week(s).   Specialty: Family Medicine Contact information: [address] [place] [address] [phone]               Hospital Course: 35 year old F with PMH of anemia, asthma, gastritis, GERD, insomnia and multiple ED visits with abdominal pain presenting with upper abdominal pain following colonoscopy the day prior to presentation.  Of note, CT abdomen and pelvis on 3/15 showed circumferential thickening of the terminal ileum with surrounding fat stranding concerning for IBD, and RLQ lymphadenopathy.  Colonoscopy on 07/09/20 showed normal-appearing sinus, rectum and colon with a tight stricture in the terminal ileum and deformed ileocecal valve.  Pathology reported as chronic active ileitis.  CT enterography on 3/28 showed low density small bowel wall thickening with adjacent inflammatory stranding involving the terminal ileum concerning for Crohn's disease.  She was a started on IV Solu-Medrol. GI evaluated patient, discussed treatment options and cleared patient for discharge on prednisone taper and Levsin.  She has GI follow-up as above.   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  GI also recommended Levsin for cramping.  Outpatient GI follow-up as above.  She is already on PPI.  Extensive discussion on return precautions.   Normocytic anemia: H&H stable. Recent Labs    07/27/20 1259 07/29/20 1926 08/07/20 0650 08/08/20 0322 08/09/20 0420 08/10/20 0409 08/11/20 0314 08/12/20 0334 08/13/20 0347  HGB 11.1* 10.1* 10.4* 10.2* 9.4* 10.0* 10.0* 9.9* 9.4*   Elevated blood pressure: SBP in 140-150s.  No history of hypertension. -Started on amlodipine 5 mg daily -Recommended low-salt diet and outpatient follow-up with PCP  Gastritis/asthma/insomnia -Continue home medications  Class I obesity Body mass index is 34.28 kg/m.  -Encourage lifestyle change to lose weight.          Discharge Exam: Vitals:   08/14/20 1056 08/14/20 1401  BP: (!) 140/95 138/86  Pulse: 62 63  Resp:  18  Temp:  98.3 F (36.8 C)  SpO2:  100%    GENERAL: No apparent distress.  Nontoxic. HEENT: MMM.  Vision and hearing grossly intact.  NECK: Supple.  No apparent JVD.  RESP: On RA.  No IWOB.  Fair aeration bilaterally. CVS:  RRR. Heart sounds normal.  ABD/GI/GU: Bowel sounds present. Soft.  Mild diffuse tenderness. MSK/EXT:  Moves extremities. No apparent deformity. No edema.  SKIN: no apparent skin lesion or wound NEURO: Awake, alert and oriented appropriately.  No apparent focal neuro deficit. PSYCH: Calm. Normal affect.   Discharge Instructions  Discharge Instructions    Call MD for:  persistant nausea and vomiting   Complete by: As directed    Call MD for:  severe uncontrolled pain  Complete by: As directed    Call MD for:  temperature >100.4   Complete by: As directed    Diet - low sodium heart healthy   Complete by: As directed    Discharge instructions   Complete by: As directed    It has been a pleasure taking care of you!  You were hospitalized due to abdominal pain likely related to Crohn's.   You have been started on prednisone.  Please follow the directions on your medications.  The gastroenterologist office will arrange outpatient follow-up in the next 2 to 3 weeks.  We have also started you on amlodipine for high blood pressure.  Please keep your blood pressure log and follow-up with your primary care doctor 1 to 2 weeks.    Take care,   Increase activity slowly   Complete by: As directed      Allergies as of 08/14/2020      Reactions   Latex Rash      Medication List    STOP taking these medications   ciprofloxacin 500 MG tablet Commonly known as: CIPRO   dicyclomine 20 MG tablet Commonly known as: BENTYL   metroNIDAZOLE 500 MG tablet Commonly known as: FLAGYL   sulfaSALAzine 500 MG tablet Commonly known as: AZULFIDINE     TAKE these medications   amLODipine 5 MG tablet Commonly known as: NORVASC Take 1 tablet (5 mg total) by mouth daily. Start taking on: August 15, 2020   HYDROcodone-acetaminophen 5-325 MG tablet Commonly known as: NORCO/VICODIN Take 1 tablet by mouth every 4 (four) hours as needed for moderate pain.   hyoscyamine 0.125 MG tablet Commonly known as: Levsin Take 1 tablet (0.125 mg total) by mouth every 6 (six) hours as needed for cramping.   methocarbamol 500 MG tablet Commonly known as: ROBAXIN Take 1 tablet (500 mg total) by mouth 2 (two) times daily.   omeprazole 40 MG capsule Commonly known as: PRILOSEC Take 1 capsule (40 mg total) by mouth daily.   ondansetron 4 MG disintegrating tablet Commonly known as: Zofran ODT Take 1 tablet (4 mg total) by mouth every 8 (eight) hours as needed for nausea or vomiting.   predniSONE 10 MG tablet Commonly known as: DELTASONE Take 60 mg (6 tablets) daily with breakfast for 2 weeks, then decrease by 10 mg every week until you complete the whole course.   ProAir HFA 108 (90 Base) MCG/ACT inhaler Generic drug: albuterol Inhale 1 puff into the lungs every 4 (four) hours as needed for  wheezing or shortness of breath.   promethazine 25 MG tablet Commonly known as: PHENERGAN Take 25 mg by mouth every 6 (six) hours as needed for nausea or vomiting.   zolpidem 10 MG tablet Commonly known as: AMBIEN Take 10 mg by mouth at bedtime as needed for sleep.       Consultations:  Gastroenterology  Procedures/Studies:  Colonoscopy on 07/09/20 showed normal-appearing sinus, rectum and colon with a tight stricture in the terminal ileum and deformed ileocecal valve.  Pathology reported as chronic active ileitis   US Abdomen Complete  Result Date: 08/11/2020 CLINICAL DATA:  Abdominal pain EXAM: ABDOMEN ULTRASOUND COMPLETE COMPARISON:  07/30/2020 FINDINGS: Gallbladder: Gallbladder is well distended with multiple stones within. No wall thickening or pericholecystic fluid is noted. Common bile duct: Diameter: 4 mm. Liver: Slight increased echogenicity is noted likely related to fatty infiltration. No focal mass is seen. Portal vein is patent on color Doppler imaging with normal direction of blood flow towards the  liver. IVC: No abnormality visualized. Pancreas: Visualized portion unremarkable. Spleen: Size and appearance within normal limits. Right Kidney: Length: 11 cm. Echogenicity within normal limits. No mass or hydronephrosis visualized. Left Kidney: Length: 11.1 cm. Echogenicity within normal limits. No mass or hydronephrosis visualized. Abdominal aorta: No aneurysm visualized. Other findings: None. IMPRESSION: Fatty liver. Cholelithiasis without complicating factors. Electronically Signed   By: Alcide Clever M.D.   On: 08/11/2020 01:16   CT ABDOMEN PELVIS W CONTRAST  Result Date: 07/30/2020 CLINICAL DATA:  Nausea, burning sensation upper abdomen, history of gastritis EXAM: CT ABDOMEN AND PELVIS WITH CONTRAST TECHNIQUE: Multidetector CT imaging of the abdomen and pelvis was performed using the standard protocol following bolus administration of intravenous contrast. CONTRAST:   OMNIPAQUE IOHEXOL 300 MG/ML  SOLN COMPARISON:  None. FINDINGS: Lower chest: No acute pleural or parenchymal lung disease. Hepatobiliary: No focal liver abnormality is seen. No gallstones, gallbladder wall thickening, or biliary dilatation. Pancreas: Unremarkable. No pancreatic ductal dilatation or surrounding inflammatory changes. Spleen: Normal in size without focal abnormality. Adrenals/Urinary Tract: Adrenal glands are unremarkable. Kidneys are normal, without renal calculi, focal lesion, or hydronephrosis. Bladder is unremarkable. Stomach/Bowel: No bowel obstruction or ileus. Normal appendix right lower quadrant. There is marked circumferential wall thickening of the terminal ileum, with surrounding fat stranding, most consistent with inflammatory bowel disease such as Crohn disease. No other bowel wall thickening. Vascular/Lymphatic: No significant vascular abnormalities. Right lower quadrant lymphadenopathy is identified, largest lymph node measuring 12 mm in short axis. Reproductive: Uterus and bilateral adnexa are unremarkable. Other: Trace pelvic free fluid. No free intraperitoneal gas. No abdominal wall hernia. Musculoskeletal: No acute or destructive bony lesions. Reconstructed images demonstrate no additional findings. IMPRESSION: 1. Circumferential bowel wall thickening involving the terminal ileum, with surrounding inflammatory changes and reactive adenopathy. Overall, findings are most consistent with inflammatory bowel disease such as Crohn disease in a patient of this age. Infectious ileitis thought to be less likely. 2. Trace pelvic free fluid, likely reactive. 3. Normal appendix. Electronically Signed   By: Sharlet Salina M.D.   On: 07/30/2020 03:43   CT ENTERO ABD/PELVIS W CONTAST  Result Date: 08/12/2020 CLINICAL DATA:  Follow-up for possible Crohn's disease. EXAM: CT ABDOMEN AND PELVIS WITH CONTRAST (ENTEROGRAPHY) TECHNIQUE: Multidetector CT of the abdomen and pelvis during bolus  administration of intravenous contrast. Negative oral contrast was given. CONTRAST:  OMNIPAQUE IOHEXOL 300 MG/ML  SOLN COMPARISON:  CT abdomen pelvis July 30, 2020. FINDINGS: Lower chest:  No acute abnormality. Hepatobiliary: No suspicious hepatic lesions. Gallbladder is unremarkable. No biliary ductal dilation. Pancreas: No mass, inflammatory changes, or other significant abnormality. Spleen: Within normal limits in size and appearance. Adrenals/Urinary Tract: No masses identified. No evidence of hydronephrosis. Stomach/Bowel: Stomach is distended with ingested negative oral contrast, without evidence of suspicious wall thickening. Minimal low-density filling of the small bowel. No small bowel dilation. There is similar low-density small bowel wall thickening with adjacent inflammatory stranding involving the terminal ileum. The appendix is normal appearance. Short segment region of colonic wall thickening on image 64/6, however the colon is decompressed in this location and appeared normal on recent prior. The distal colon is predominantly decompressed limiting evaluation. Scattered colonic diverticulosis without findings of acute diverticulitis. Vascular/Lymphatic: Interval decrease in size of the prominent right lower quadrant lymph nodes now measuring 6 mm in maximum axial diameter on image 45/2 previously. Splenules versus prominent lymph nodes in the left upper quadrant and along the left pericolic gutter measuring up to 5 mm appears  similar to prior. Reproductive: No mass or other significant abnormality. Other: Trace pelvic free fluid, likely reactive. Musculoskeletal: No suspicious bone lesions identified. IMPRESSION: 1. There is similar low-density small bowel wall thickening with adjacent inflammatory stranding involving the terminal ileum, likely representing Crohn's disease. 2. Interval decrease in size of the prominent right lower quadrant lymph nodes, now measuring 6 mm in maximum axial  diameter. 3. Splenules versus prominent lymph nodes in the left upper quadrant and along the pericolic gutter measuring up to 5 mm appears similar to prior. 4. Trace pelvic free fluid, likely reactive. Electronically Signed   By: Maudry MayhewJeffrey  Waltz MD   On: 08/12/2020 22:28   DG Abd Acute W/Chest  Result Date: 08/07/2020 CLINICAL DATA:  Abdominal pain EXAM: DG ABDOMEN ACUTE WITH 1 VIEW CHEST COMPARISON:  CT abdomen and pelvis July 30, 2020 FINDINGS: PA chest: Lungs are clear. Heart size and pulmonary vascularity are normal. No adenopathy. No bone lesions. Supine and upright abdomen: There is no appreciable bowel dilatation. There are scattered air-fluid levels. No free air. Probable phleboliths in the right pelvis. IMPRESSION: Scattered air-fluid levels without bowel dilatation. Suspect a degree of ileus or enteritis. Bowel obstruction not felt to be likely. No free air evident. Lungs clear. Electronically Signed   By: Bretta BangWilliam  Woodruff III M.D.   On: 08/07/2020 09:10        The results of significant diagnostics from this hospitalization (including imaging, microbiology, ancillary and laboratory) are listed below for reference.     Microbiology: Recent Results (from the past 240 hour(s))  SARS CORONAVIRUS 2 (TAT 6-24 HRS) Nasopharyngeal Nasopharyngeal Swab     Status: None   Collection Time: 08/07/20  4:46 PM   Specimen: Nasopharyngeal Swab  Result Value Ref Range Status   SARS Coronavirus 2 NEGATIVE NEGATIVE Final    Comment: (NOTE) SARS-CoV-2 target nucleic acids are NOT DETECTED.  The SARS-CoV-2 RNA is generally detectable in upper and lower respiratory specimens during the acute phase of infection. Negative results do not preclude SARS-CoV-2 infection, do not rule out co-infections with other pathogens, and should not be used as the sole basis for treatment or other patient management decisions. Negative results must be combined with clinical observations, patient history, and  epidemiological information. The expected result is Negative.  Fact Sheet for Patients: HairSlick.nohttps://www.fda.gov/media/138098/download  Fact Sheet for Healthcare Providers: quierodirigir.comhttps://www.fda.gov/media/138095/download  This test is not yet approved or cleared by the [place] FDA and  has been authorized for detection and/or diagnosis of SARS-CoV-2 by FDA under an Emergency Use Authorization (EUA). This EUA will remain  in effect (meaning this test can be used) for the duration of the COVID-19 declaration under Se ction 564(b)(1) of the Act, 21 U.S.C. section 360bbb-3(b)(1), unless the authorization is terminated or revoked sooner.  Performed at [facility], [address] [place], [place] [id number]      Labs:  CBC: Recent Labs  Lab 08/09/20 [phone] 08/10/20 0409 08/11/20 0314 08/12/20 0334 08/13/20 0347  WBC 11.1* 11.3* 8.3 8.6 9.2  HGB 9.4* 10.0* 10.0* 9.9* 9.4*  HCT 32.5* 33.7* 32.9* 32.9* 32.2*  MCV 75.6* 75.2* 74.4* 74.4* 75.2*  PLT 258 284 278 270 284   BMP &GFR Recent Labs  Lab 08/08/20 0322 08/09/20 0420 08/10/20 0409 08/11/20 0314 08/12/20 0334 08/14/20 0354  NA 138 136 136 138 136  --   K 4.4 4.2 3.9 3.6 3.9  --   CL 110 106 102 103 105  --   CO2 19*  --   GLUCOSE 117* 95 95 106* 113*  --   BUN <5* --   CREATININE 0.86 0.93 0.99 0.98 0.86 1.08*  CALCIUM 9.2 9.2 9.5 9.4 9.2  --    Estimated Creatinine Clearance: 83.7 mL/min (A) (by C-G formula based on SCr of 1.08 mg/dL (H)). Liver & Pancreas: Recent Labs  Lab 08/12/20 0334  AST 18  ALT 25  ALKPHOS 40  BILITOT 0.4  PROT 6.8  ALBUMIN 3.5   No results for input(s): LIPASE, AMYLASE in the last 168 hours. No results for input(s): AMMONIA in the last 168 hours. Diabetic: No results for input(s): HGBA1C in the last 72 hours. No results for input(s): GLUCAP in the last 168 hours. Cardiac Enzymes: No results for input(s): CKTOTAL, CKMB, CKMBINDEX, TROPONINI in the  last 168 hours. No results for input(s): PROBNP in the last 8760 hours. Coagulation Profile: Recent Labs  Lab 08/13/20 0347  INR 1.2   Thyroid Function Tests: No results for input(s): TSH, T4TOTAL, FREET4, T3FREE, THYROIDAB in the last 72 hours. Lipid Profile: No results for input(s): CHOL, HDL, LDLCALC, TRIG, CHOLHDL, LDLDIRECT in the last 72 hours. Anemia Panel: No results for input(s): VITAMINB12, FOLATE, FERRITIN, TIBC, IRON, RETICCTPCT in the last 72 hours. Urine analysis:    Component Value Date/Time   COLORURINE STRAW (A) 08/12/2020 1800   APPEARANCEUR CLEAR 08/12/2020 1800   LABSPEC 1.008 08/12/2020 1800   PHURINE 6.0 08/12/2020 1800   GLUCOSEU NEGATIVE 08/12/2020 1800   HGBUR SMALL (A) 08/12/2020 1800   BILIRUBINUR NEGATIVE 08/12/2020 1800   KETONESUR NEGATIVE 08/12/2020 1800   PROTEINUR NEGATIVE 08/12/2020 1800   NITRITE NEGATIVE 08/12/2020 1800   LEUKOCYTESUR NEGATIVE 08/12/2020 1800   Sepsis Labs: Invalid input(s): PROCALCITONIN, LACTICIDVEN   Time coordinating discharge: 40 minutes  SIGNED:  Almon Hercules, MD  [facility] 08/14/2020, 3:40 PM  If 7PM-7AM, please contact night-coverage [email or web address]

## 2020-08-14 NOTE — Progress Notes (Signed)
Discharge instructions given with stated understanding 

## 2020-08-14 NOTE — Telephone Encounter (Signed)
Patient called stated she is still admitted at the hospital but has an appointment to be seen at the office which she will not be able to do. She is asking if anyone will be following up with her over there. Please advise.

## 2020-08-21 ENCOUNTER — Encounter: Payer: Self-pay | Admitting: Gastroenterology

## 2020-08-21 ENCOUNTER — Ambulatory Visit: Payer: Medicaid Other | Admitting: Gastroenterology

## 2020-08-21 ENCOUNTER — Ambulatory Visit (INDEPENDENT_AMBULATORY_CARE_PROVIDER_SITE_OTHER)
Admission: RE | Admit: 2020-08-21 | Discharge: 2020-08-21 | Disposition: A | Discharge status: Home / Self Care | Payer: Medicaid Other | Source: Ambulatory Visit | Attending: Gastroenterology | Admitting: Gastroenterology

## 2020-08-21 ENCOUNTER — Other Ambulatory Visit: Payer: Self-pay

## 2020-08-21 VITALS — BP 120/78 | HR 72 | Ht 65.75 in | Wt 194.1 lb

## 2020-08-21 DIAGNOSIS — R112 Nausea with vomiting, unspecified: Secondary | ICD-10-CM | POA: Diagnosis not present

## 2020-08-21 DIAGNOSIS — K5900 Constipation, unspecified: Secondary | ICD-10-CM

## 2020-08-21 DIAGNOSIS — K50019 Crohn's disease of small intestine with unspecified complications: Secondary | ICD-10-CM

## 2020-08-21 LAB — THIOPURINE METHYLTRANSFERASE (TPMT), RBC: TPMT Activity:: 17.8 Units/mL RBC

## 2020-08-21 NOTE — Patient Instructions (Signed)
If you are age 34 or older, your body mass index should be between 23-30. Your Body mass index is 31.57 kg/m. If this is out of the aforementioned range listed, please consider follow up with your Primary Care Provider.  If you are age 35 or younger, your body mass index should be between 19-25. Your Body mass index is 31.57 kg/m. If this is out of the aformentioned range listed, please consider follow up with your Primary Care Provider.   Your provider has requested that you go to the basement level for x-rays before leaving today. Press "B" on the elevator.    You have been scheduled for a gastric emptying scan at [facility] on 09-06-2020 at 730am. Please arrive at least 15 minutes prior to your appointment for registration. Please make certain not to have anything to eat or drink after midnight the night before your test. Hold all stomach medications (ex: Zofran, phenergan, Reglan) 48 hours prior to your test. If you need to reschedule your appointment, please contact radiology scheduling at [phone]. _____________________________________________________________________ A gastric-emptying study measures how long it takes for food to move through your stomach. There are several ways to measure stomach emptying. In the most common test, you eat food that contains a small amount of radioactive material. A scanner that detects the movement of the radioactive material is placed over your abdomen to monitor the rate at which food leaves your stomach. This test normally takes about 4 hours to complete. _____________________________________________________________________   Shanda Bumps recommends that you complete a bowel purge (to clean out your bowels). Please do the following: Purchase a bottle of Miralax over the counter as well as a box of 5 mg dulcolax tablets. Take 4 dulcolax tablets. Wait 1 hour. You will then drink 6-8 capfuls of Miralax mixed in an adequate amount of  water/juice/gatorade (you may choose which of these liquids to drink) over the next 2-3 hours. You should expect results within 1 to 6 hours after completing the bowel purge. Then continue to take the Miralax twice a day.  It was a pleasure to see you today!  Dr. Myrtie Neither

## 2020-08-21 NOTE — Progress Notes (Signed)
08/23/2020 Morgan Chung [id number] 08-Jul-1985   HISTORY OF PRESENT ILLNESS:  This is a 35 year old female who is a patient of Dr. Elana Alm with newly diagnosed Crohn's disease of the terminal ileum.  Colonoscopy 08/06/20 showed a normal-appearing anus rectum and colon with a tight stricture in the terminal ileum with deformed ileocecal valve.  Biopsies were taken.  1. Surgical [P], small bowel, terminal ileum - CHRONIC ACTIVE ILEITIS - NO GRANULOMATA, DYSPLASIA OR MALIGNANCY IDENTIFIED 2. Surgical [P], right colon bx's - BENIGN COLONIC MUCOSA - NO ACTIVE INFLAMMATION OR EVIDENCE OF MICROSCOPIC COLITIS - NO HIGH GRADE DYSPLASIA OR MALIGNANCY IDENTIFIED 3. Surgical [P], left colon bx's - BENIGN COLONIC MUCOSA - NO ACTIVE INFLAMMATION OR EVIDENCE OF MICROSCOPIC COLITIS - NO HIGH GRADE DYSPLASIA OR MALIGNANCY IDENTIFIED  She presented to [facility] long hospital same day following her colonoscopy with complaints of worsening abdominal pain.  CT enterography was performed and showed the following:  IMPRESSION: 1. There is similar low-density small bowel wall thickening with adjacent inflammatory stranding involving the terminal ileum, likely representing Crohn's disease. 2. Interval decrease in size of the prominent right lower quadrant lymph nodes, now measuring 6 mm in maximum axial diameter. 3. Splenules versus prominent lymph nodes in the left upper quadrant and along the pericolic gutter measuring up to 5 mm appears similar to prior. 4. Trace pelvic free fluid, likely reactive.  While here she was treated with IV steroids.  She was given suppositories and MiraLAX to help move her bowels.  The patient was seen by surgery.  CT was reviewed with radiology and was thought to be more active inflammation rather than stricture.  Plan was for 60 mg prednisone daily for 2 weeks then 10 mg taper weekly after that and hopefully for biologic/immunomodulator therapy.  She is here today for  follow-up.  She continues to complain of constipation despite using her MiraLAX twice daily.  She says that she can only eat once a day and she eats more than that then she will vomit.  Complains of LLQ abdominal pain.  Past Medical History:  Diagnosis Date  . Anemia   . Asthma   . Gastritis   . GERD (gastroesophageal reflux disease)   . Insomnia    Past Surgical History:  Procedure Laterality Date  . CESAREAN SECTION      reports that she has never smoked. She has never used smokeless tobacco. She reports current alcohol use. She reports that she does not use drugs. family history includes Diabetes in her father; Hypertension in her father and mother; Irritable bowel syndrome in her mother and sister. Allergies  Allergen Reactions  . Latex Rash      Outpatient Encounter Medications as of 08/21/2020  Medication Sig  . amLODipine (NORVASC) 5 MG tablet Take 1 tablet (5 mg total) by mouth daily.  Marland Kitchen HYDROcodone-acetaminophen (NORCO/VICODIN) 5-325 MG tablet Take 1 tablet by mouth every 4 (four) hours as needed for moderate pain.  . hyoscyamine (LEVSIN) 0.125 MG tablet Take 1 tablet (0.125 mg total) by mouth every 6 (six) hours as needed for cramping.  . methocarbamol (ROBAXIN) 500 MG tablet Take 1 tablet (500 mg total) by mouth 2 (two) times daily.  Marland Kitchen omeprazole (PRILOSEC) 40 MG capsule Take 1 capsule (40 mg total) by mouth daily.  . ondansetron (ZOFRAN ODT) 4 MG disintegrating tablet Take 1 tablet (4 mg total) by mouth every 8 (eight) hours as needed for nausea or vomiting.  . predniSONE (DELTASONE) 10 MG  tablet Take 60 mg (6 tablets) daily with breakfast for 2 weeks, then decrease by 10 mg every week until you complete the whole course.  Marland Kitchen PROAIR HFA 108 (90 Base) MCG/ACT inhaler Inhale 1 puff into the lungs every 4 (four) hours as needed for wheezing or shortness of breath.  . promethazine (PHENERGAN) 25 MG tablet Take 25 mg by mouth every 6 (six) hours as needed for nausea or vomiting.   Marland Kitchen zolpidem (AMBIEN) 10 MG tablet Take 10 mg by mouth at bedtime as needed for sleep.   No facility-administered encounter medications on file as of 08/21/2020.    REVIEW OF SYSTEMS  : All other systems reviewed and negative except where noted in the History of Present Illness.  PHYSICAL EXAM: BP 120/78 (BP Location: Left Arm, Patient Position: Sitting, Cuff Size: Normal)   Pulse 72   Ht 5' 5.75" (1.67 m) Comment: height measured without shoes  Wt 194 lb 2 oz (88.1 kg)   LMP 08/15/2020   BMI 31.57 kg/m  General: Well developed AA female in no acute distress Head: Normocephalic and atraumatic Eyes:  Sclerae anicteric, conjunctiva pink. Ears: Normal auditory acuity Lungs: Clear throughout to auscultation; no W/R/R. Heart: Regular rate and rhythm; no M/R/G. Abdomen: Soft, non-distended.  BS present.  Mild lower abdominal TTP. Musculoskeletal: Symmetrical with no gross deformities  Skin: No lesions on visible extremities Extremities: No edema  Neurological: Alert oriented x 4, grossly non-focal Psychological:  Alert and cooperative. Normal mood and affect  ASSESSMENT AND PLAN: -Newly diagnosed Crohn's disease of the TI:  Currently on prednisone 60 mg daily.  Plan is for dual therapy with biologic and immunomodulator.  Her TPMT level is normal.  Will discuss with Dr. Lavon Paganini about treatment plan with anticipation to start and Remicade.  Continue prednisone as directed with 60 mg daily for 2 weeks then decreasing by 10 mg weekly. -Constipation:  Will order abdominal x-ray to reassure her no sign of obstruction.  Will have her instructed on Miralax bowel purge then she needs to continue Miralax BID. -Nausea and vomiting after eating:  I am not sure if this is related to her Crohn's disease.  Says that if she eats more than one meal per day then she feels very full and will vomit.  Will check GES.  CC:  Morgan Able, MD

## 2020-08-22 ENCOUNTER — Telehealth: Payer: Self-pay | Admitting: Gastroenterology

## 2020-08-22 NOTE — Telephone Encounter (Signed)
The pt has been advised that as soon as the results are reviewed we will call her.  The pt has been advised of the information and verbalized understanding.

## 2020-08-22 NOTE — Telephone Encounter (Signed)
Patient is requesting lab and\or Ct results

## 2020-08-23 ENCOUNTER — Telehealth: Payer: Self-pay

## 2020-08-23 ENCOUNTER — Other Ambulatory Visit: Payer: Self-pay | Admitting: Gastroenterology

## 2020-08-23 ENCOUNTER — Encounter: Payer: Self-pay | Admitting: Gastroenterology

## 2020-08-23 DIAGNOSIS — K5900 Constipation, unspecified: Secondary | ICD-10-CM | POA: Insufficient documentation

## 2020-08-23 DIAGNOSIS — R112 Nausea with vomiting, unspecified: Secondary | ICD-10-CM | POA: Insufficient documentation

## 2020-08-23 NOTE — Telephone Encounter (Signed)
Her x-ray showed moderate stool in the colon.  No sign of obstruction.  Did she do the Miralax bowel prep/purge?

## 2020-08-23 NOTE — Telephone Encounter (Signed)
The pt has been advised. She has NOT did the miralax purge but she tells me she will do that this weekend.

## 2020-08-23 NOTE — Telephone Encounter (Signed)
Ok. Thank you.

## 2020-08-23 NOTE — Telephone Encounter (Signed)
-----   Message from Leta Baptist, PA-C sent at 08/23/2020  3:16 PM EDT ----- I have put in the orders for the Remicade with Veena.  Can you let her know and tell her that we would also like to put her on 50 mg daily?  Send prescription.  She needs follow-up with Veena as well.  Thank you,  Jess

## 2020-08-23 NOTE — Telephone Encounter (Signed)
This is a patient of Dr. Lavon Paganini. KUB report came to my inbox because it seems to have been ordered under my name.  I have copied this to VN and JZ and will send them the KUB report as well.  - HD

## 2020-08-26 ENCOUNTER — Telehealth: Payer: Self-pay | Admitting: Pharmacy Technician

## 2020-08-26 ENCOUNTER — Other Ambulatory Visit: Payer: Self-pay | Admitting: Pharmacy Technician

## 2020-08-26 MED ORDER — MERCAPTOPURINE 50 MG PO TABS: 50.0000 mg | ORAL_TABLET | Freq: Every day | ORAL | 6 refills | Status: DC

## 2020-08-26 NOTE — Telephone Encounter (Signed)
Per Insurance: No Berkley Harvey is required for JCODE (J1745) REMICADE.Marland Kitchen  REF# [id number] PHONE# [phone]

## 2020-08-26 NOTE — Telephone Encounter (Signed)
Per [employer].  REMICADE J1745 does NOT require authorization. There is no deductible. REF# [id number] PHONE# [phone].

## 2020-08-27 ENCOUNTER — Ambulatory Visit: Payer: Medicaid Other | Admitting: Physician Assistant

## 2020-09-02 ENCOUNTER — Other Ambulatory Visit: Payer: Self-pay | Admitting: Gastroenterology

## 2020-09-02 DIAGNOSIS — K50019 Crohn's disease of small intestine with unspecified complications: Secondary | ICD-10-CM

## 2020-09-07 NOTE — Progress Notes (Signed)
Reviewed and agree with documentation and assessment and plan. K. Veena Karas Pickerill , MD   

## 2020-09-09 NOTE — Progress Notes (Signed)
Reviewed and agree with documentation and assessment and plan. K. Veena Rufus Beske , MD   

## 2020-09-11 ENCOUNTER — Ambulatory Visit (HOSPITAL_COMMUNITY): Admission: RE | Admit: 2020-09-11 | Payer: Medicaid Other | Source: Ambulatory Visit

## 2020-09-24 ENCOUNTER — Telehealth: Payer: Self-pay

## 2020-09-24 NOTE — Telephone Encounter (Signed)
Napoleon Form, MD  Desma Mcgregor, [facility]; Celedonio, Rolanda Lundborg; Amalia Greenhouse, CPhT; McKew, Austin Miles, LPN; Zehr, Princella Pellegrini, PA-C Beth, Please bring patient in for office visit to discuss treatment again. She has refused to schedule infusion  Thanks  VN   Patient is already scheduled for a follow up appt with Dr. Lavon Paganini on Monday, 10/07/20 at 1:30 PM.

## 2020-10-07 ENCOUNTER — Ambulatory Visit: Payer: Medicaid Other | Admitting: Gastroenterology

## 2020-11-12 ENCOUNTER — Ambulatory Visit: Payer: Medicaid Other | Admitting: Obstetrics and Gynecology

## 2020-12-04 ENCOUNTER — Other Ambulatory Visit: Payer: Self-pay | Admitting: Pharmacy Technician

## 2020-12-09 ENCOUNTER — Ambulatory Visit (INDEPENDENT_AMBULATORY_CARE_PROVIDER_SITE_OTHER): Payer: Medicaid Other | Admitting: Obstetrics and Gynecology

## 2020-12-09 ENCOUNTER — Other Ambulatory Visit (HOSPITAL_COMMUNITY)
Admission: RE | Admit: 2020-12-09 | Discharge: 2020-12-09 | Disposition: A | Discharge status: Home / Self Care | Payer: Medicaid Other | Source: Ambulatory Visit | Attending: Obstetrics and Gynecology | Admitting: Obstetrics and Gynecology

## 2020-12-09 ENCOUNTER — Encounter: Payer: Self-pay | Admitting: Obstetrics and Gynecology

## 2020-12-09 ENCOUNTER — Other Ambulatory Visit: Payer: Self-pay

## 2020-12-09 VITALS — BP 134/88 | HR 86 | Ht 65.0 in | Wt 184.0 lb

## 2020-12-09 DIAGNOSIS — Z01419 Encounter for gynecological examination (general) (routine) without abnormal findings: Secondary | ICD-10-CM | POA: Insufficient documentation

## 2020-12-09 DIAGNOSIS — K5000 Crohn's disease of small intestine without complications: Secondary | ICD-10-CM | POA: Diagnosis not present

## 2020-12-09 NOTE — Progress Notes (Signed)
Pt is here today for new patient appt for yearly exam. LMP: 11/17/20. BCM: none. Last pap: 07/2017 WNL. Pt states periods have been irregular lately. States she has 2 cycles this month. States she will start another cycle tomorrow.

## 2020-12-09 NOTE — Progress Notes (Signed)
Subjective:     Morgan Chung is a 35 y.o. female P1 with LMP 11/17/20 and BMI 30 who is here for a comprehensive physical exam. The patient reports doing well and is without any complaints. She is sexually active in a same sex relationship. Patient is not using any contraception. She denies pelvic pain or abnormal discharge. She reports a monthly period every 24-28 days. She reports experiencing a period every 18 months twice in the past month. This was associated with a recent hospitalization as she is currently being evaluated for possible Crohn's disease. Patient denies any urinary incontinence  Past Medical History:  Diagnosis Date   Anemia    Asthma    Gastritis    GERD (gastroesophageal reflux disease)    Insomnia    Past Surgical History:  Procedure Laterality Date   CESAREAN SECTION     Family History  Problem Relation Age of Onset   Hypertension Mother    Irritable bowel syndrome Mother    Diabetes Father    Hypertension Father    Irritable bowel syndrome Sister    Colon cancer Neg Hx    Pancreatic cancer Neg Hx    Stomach cancer Neg Hx    Esophageal cancer Neg Hx    Liver cancer Neg Hx    Colon polyps Neg Hx      Social History   Socioeconomic History   Marital status: Single    Spouse name: Not on file   Number of children: Not on file   Years of education: Not on file   Highest education level: Not on file  Occupational History   Not on file  Tobacco Use   Smoking status: Never   Smokeless tobacco: Never  Vaping Use   Vaping Use: Never used  Substance and Sexual Activity   Alcohol use: Yes    Comment: occasionally ( 3 times per month)   Drug use: Never   Sexual activity: Yes    Partners: Male    Birth control/protection: None  Other Topics Concern   Not on file  Social History Narrative   Not on file   Social Determinants of Health   Financial Resource Strain: Not on file  Food Insecurity: Not on file  Transportation Needs: Not on file  Physical  Activity: Not on file  Stress: Not on file  Social Connections: Not on file  Intimate Partner Violence: Not on file   Health Maintenance  Topic Date Due   COVID-19 Vaccine (1) Never done   TETANUS/TDAP  Never done   PAP SMEAR-Modifier  Never done   INFLUENZA VACCINE  12/16/2020   Hepatitis C Screening  Completed   HIV Screening  Completed   Pneumococcal Vaccine 43-71 Years old  Aged Out   HPV VACCINES  Aged Out       Review of Systems Pertinent items noted in HPI and remainder of comprehensive ROS otherwise negative.   Objective:  Blood pressure 134/88, pulse 86, height 5\' 5"  (1.651 m), weight 184 lb (83.5 kg), last menstrual period 11/17/2020.    GENERAL: Well-developed, well-nourished female in no acute distress.  HEENT: Normocephalic, atraumatic. Sclerae anicteric.  NECK: Supple. Normal thyroid.  LUNGS: Clear to auscultation bilaterally.  HEART: Regular rate and rhythm. BREASTS: Symmetric in size. No palpable masses or lymphadenopathy, skin changes, or nipple drainage. ABDOMEN: Soft, nontender, nondistended. No organomegaly. PELVIC: Normal external female genitalia. Vagina is pink and rugated.  Normal discharge. Normal appearing cervix. Uterus is normal in size.  No  adnexal mass or tenderness. EXTREMITIES: No cyanosis, clubbing, or edema, 2+ distal pulses.    Assessment:    Healthy female exam.      Plan:    Pap smear collected STI screening per patient request Labs ordered TSH, CBC, A1C Patient will be contacted with abnormal results Patient referred to nutritionist given recent weight loss and inability to eat due to GI upset and pending Crohn's disease evaluation See After Visit Summary for Counseling Recommendations

## 2020-12-10 LAB — COMPREHENSIVE METABOLIC PANEL
ALT: 13 IU/L (ref 0–32)
AST: 18 IU/L (ref 0–40)
Albumin/Globulin Ratio: 1.6 (ref 1.2–2.2)
Albumin: 4.4 g/dL (ref 3.8–4.8)
Alkaline Phosphatase: 60 IU/L (ref 44–121)
BUN/Creatinine Ratio: 9 (ref 9–23)
BUN: 8 mg/dL (ref 6–20)
Bilirubin Total: 0.3 mg/dL (ref 0.0–1.2)
CO2: 21 mmol/L (ref 20–29)
Calcium: 9.4 mg/dL (ref 8.7–10.2)
Chloride: 102 mmol/L (ref 96–106)
Creatinine, Ser: 0.89 mg/dL (ref 0.57–1.00)
Globulin, Total: 2.8 g/dL (ref 1.5–4.5)
Glucose: 100 mg/dL — ABNORMAL HIGH (ref 65–99)
Potassium: 3.8 mmol/L (ref 3.5–5.2)
Sodium: 138 mmol/L (ref 134–144)
Total Protein: 7.2 g/dL (ref 6.0–8.5)
eGFR: 87 mL/min/{1.73_m2} (ref >59)

## 2020-12-10 LAB — CBC
Hematocrit: 34.8 % (ref 34.0–46.6)
Hemoglobin: 10.5 g/dL — ABNORMAL LOW (ref 11.1–15.9)
MCH: 22.4 pg — ABNORMAL LOW (ref 26.6–33.0)
MCHC: 30.2 g/dL — ABNORMAL LOW (ref 31.5–35.7)
MCV: 74 fL — ABNORMAL LOW (ref 79–97)
Platelets: 264 10*3/uL (ref 150–450)
RBC: 4.68 x10E6/uL (ref 3.77–5.28)
RDW: 15.8 % — ABNORMAL HIGH (ref 11.7–15.4)
WBC: 6.4 10*3/uL (ref 3.4–10.8)

## 2020-12-10 LAB — RPR: RPR Ser Ql: NONREACTIVE

## 2020-12-10 LAB — HEPATITIS C ANTIBODY: Hep C Virus Ab: 0.2 s/co ratio (ref 0.0–0.9)

## 2020-12-10 LAB — HEPATITIS B SURFACE ANTIGEN: Hepatitis B Surface Ag: NEGATIVE

## 2020-12-10 LAB — HIV ANTIBODY (ROUTINE TESTING W REFLEX): HIV Screen 4th Generation wRfx: NONREACTIVE

## 2020-12-10 LAB — HEMOGLOBIN A1C
Est. average glucose Bld gHb Est-mCnc: 108 mg/dL
Hgb A1c MFr Bld: 5.4 % (ref 4.8–5.6)

## 2020-12-10 LAB — TSH: TSH: 1.15 u[IU]/mL (ref 0.450–4.500)

## 2020-12-11 LAB — CERVICOVAGINAL ANCILLARY ONLY
Bacterial Vaginitis (gardnerella): NEGATIVE
Candida Glabrata: NEGATIVE
Candida Vaginitis: NEGATIVE
Chlamydia: NEGATIVE
Comment: NEGATIVE
Comment: NEGATIVE
Comment: NEGATIVE
Comment: NEGATIVE
Comment: NEGATIVE
Comment: NORMAL
Neisseria Gonorrhea: NEGATIVE
Trichomonas: NEGATIVE

## 2020-12-13 LAB — CYTOLOGY - PAP
Comment: NEGATIVE
Diagnosis: NEGATIVE
High risk HPV: NEGATIVE

## 2020-12-26 ENCOUNTER — Encounter (HOSPITAL_COMMUNITY): Payer: Self-pay

## 2020-12-26 ENCOUNTER — Inpatient Hospital Stay (HOSPITAL_COMMUNITY)
Admission: EM | Admit: 2020-12-26 | Discharge: 2020-12-30 | DRG: 387 | Discharge status: Against Medical Advice | Payer: Medicaid Other | Attending: Internal Medicine | Admitting: Internal Medicine

## 2020-12-26 ENCOUNTER — Other Ambulatory Visit: Payer: Self-pay

## 2020-12-26 ENCOUNTER — Emergency Department (HOSPITAL_COMMUNITY): Payer: Medicaid Other

## 2020-12-26 DIAGNOSIS — R112 Nausea with vomiting, unspecified: Secondary | ICD-10-CM | POA: Diagnosis present

## 2020-12-26 DIAGNOSIS — R1031 Right lower quadrant pain: Secondary | ICD-10-CM | POA: Diagnosis not present

## 2020-12-26 DIAGNOSIS — Z79899 Other long term (current) drug therapy: Secondary | ICD-10-CM

## 2020-12-26 DIAGNOSIS — K50012 Crohn's disease of small intestine with intestinal obstruction: Principal | ICD-10-CM | POA: Diagnosis present

## 2020-12-26 DIAGNOSIS — Z91040 Latex allergy status: Secondary | ICD-10-CM

## 2020-12-26 DIAGNOSIS — K50019 Crohn's disease of small intestine with unspecified complications: Secondary | ICD-10-CM | POA: Diagnosis present

## 2020-12-26 DIAGNOSIS — R109 Unspecified abdominal pain: Secondary | ICD-10-CM | POA: Diagnosis present

## 2020-12-26 DIAGNOSIS — J45909 Unspecified asthma, uncomplicated: Secondary | ICD-10-CM | POA: Diagnosis present

## 2020-12-26 DIAGNOSIS — Z9109 Other allergy status, other than to drugs and biological substances: Secondary | ICD-10-CM

## 2020-12-26 DIAGNOSIS — Z91018 Allergy to other foods: Secondary | ICD-10-CM

## 2020-12-26 DIAGNOSIS — K50112 Crohn's disease of large intestine with intestinal obstruction: Secondary | ICD-10-CM

## 2020-12-26 DIAGNOSIS — K5669 Other partial intestinal obstruction: Secondary | ICD-10-CM

## 2020-12-26 DIAGNOSIS — K56609 Unspecified intestinal obstruction, unspecified as to partial versus complete obstruction: Secondary | ICD-10-CM | POA: Diagnosis present

## 2020-12-26 DIAGNOSIS — Z20822 Contact with and (suspected) exposure to covid-19: Secondary | ICD-10-CM | POA: Diagnosis present

## 2020-12-26 DIAGNOSIS — K219 Gastro-esophageal reflux disease without esophagitis: Secondary | ICD-10-CM | POA: Diagnosis present

## 2020-12-26 DIAGNOSIS — D509 Iron deficiency anemia, unspecified: Secondary | ICD-10-CM | POA: Diagnosis present

## 2020-12-26 DIAGNOSIS — G4700 Insomnia, unspecified: Secondary | ICD-10-CM | POA: Diagnosis present

## 2020-12-26 HISTORY — DX: Crohn's disease, unspecified, without complications: K50.90

## 2020-12-26 LAB — CBC WITH DIFFERENTIAL/PLATELET
Abs Immature Granulocytes: 0.01 10*3/uL (ref 0.00–0.07)
Basophils Absolute: 0 10*3/uL (ref 0.0–0.1)
Basophils Relative: 0 %
Eosinophils Absolute: 0 10*3/uL (ref 0.0–0.5)
Eosinophils Relative: 1 %
HCT: 35.5 % — ABNORMAL LOW (ref 36.0–46.0)
Hemoglobin: 10.6 g/dL — ABNORMAL LOW (ref 12.0–15.0)
Immature Granulocytes: 0 %
Lymphocytes Relative: 20 %
Lymphs Abs: 1.1 10*3/uL (ref 0.7–4.0)
MCH: 22.6 pg — ABNORMAL LOW (ref 26.0–34.0)
MCHC: 29.9 g/dL — ABNORMAL LOW (ref 30.0–36.0)
MCV: 75.9 fL — ABNORMAL LOW (ref 80.0–100.0)
Monocytes Absolute: 0.2 10*3/uL (ref 0.1–1.0)
Monocytes Relative: 3 %
Neutro Abs: 4.3 10*3/uL (ref 1.7–7.7)
Neutrophils Relative %: 76 %
Platelets: 277 10*3/uL (ref 150–400)
RBC: 4.68 MIL/uL (ref 3.87–5.11)
RDW: 16.3 % — ABNORMAL HIGH (ref 11.5–15.5)
WBC: 5.7 10*3/uL (ref 4.0–10.5)
nRBC: 0 % (ref 0.0–0.2)

## 2020-12-26 LAB — URINALYSIS, ROUTINE W REFLEX MICROSCOPIC
Bilirubin Urine: NEGATIVE
Glucose, UA: NEGATIVE mg/dL
Hgb urine dipstick: NEGATIVE
Ketones, ur: NEGATIVE mg/dL
Leukocytes,Ua: NEGATIVE
Nitrite: NEGATIVE
Protein, ur: NEGATIVE mg/dL
Specific Gravity, Urine: 1.016 (ref 1.005–1.030)
pH: 6 (ref 5.0–8.0)

## 2020-12-26 LAB — COMPREHENSIVE METABOLIC PANEL
ALT: 14 U/L (ref 0–44)
AST: 20 U/L (ref 15–41)
Albumin: 4.1 g/dL (ref 3.5–5.0)
Alkaline Phosphatase: 54 U/L (ref 38–126)
Anion gap: 6 (ref 5–15)
BUN: 13 mg/dL (ref 6–20)
CO2: 26 mmol/L (ref 22–32)
Calcium: 9.4 mg/dL (ref 8.9–10.3)
Chloride: 107 mmol/L (ref 98–111)
Creatinine, Ser: 0.87 mg/dL (ref 0.44–1.00)
GFR, Estimated: >60 mL/min (ref >60)
Glucose, Bld: 86 mg/dL (ref 70–99)
Potassium: 3.7 mmol/L (ref 3.5–5.1)
Sodium: 139 mmol/L (ref 135–145)
Total Bilirubin: 0.3 mg/dL (ref 0.3–1.2)
Total Protein: 7.8 g/dL (ref 6.5–8.1)

## 2020-12-26 LAB — LIPASE, BLOOD: Lipase: 26 U/L (ref 11–51)

## 2020-12-26 LAB — SEDIMENTATION RATE: Sed Rate: 20 mm/hr (ref 0–22)

## 2020-12-26 LAB — RESP PANEL BY RT-PCR (FLU A&B, COVID) ARPGX2
Influenza A by PCR: NEGATIVE
Influenza B by PCR: NEGATIVE
SARS Coronavirus 2 by RT PCR: NEGATIVE

## 2020-12-26 LAB — I-STAT BETA HCG BLOOD, ED (MC, WL, AP ONLY): I-stat hCG, quantitative: <5 m[IU]/mL (ref <5)

## 2020-12-26 LAB — C-REACTIVE PROTEIN: CRP: 0.8 mg/dL (ref <1.0)

## 2020-12-26 MED ORDER — ONDANSETRON HCL 4 MG/2ML IJ SOLN
4.0000 mg | Freq: Four times a day (QID) | INTRAMUSCULAR | Status: DC | PRN
  Administered 2020-12-26 – 2020-12-29 (×4): 4 mg via INTRAVENOUS
  Filled 2020-12-26 (×4): qty 2

## 2020-12-26 MED ORDER — MORPHINE SULFATE (PF) 4 MG/ML IV SOLN
4.0000 mg | Freq: Once | INTRAVENOUS | Status: AC
  Administered 2020-12-26: 4 mg via INTRAVENOUS
  Filled 2020-12-26: qty 1

## 2020-12-26 MED ORDER — METHYLPREDNISOLONE SODIUM SUCC 40 MG IJ SOLR
40.0000 mg | Freq: Once | INTRAMUSCULAR | Status: AC
  Administered 2020-12-26: 40 mg via INTRAVENOUS
  Filled 2020-12-26: qty 1

## 2020-12-26 MED ORDER — ACETAMINOPHEN 325 MG PO TABS
650.0000 mg | ORAL_TABLET | Freq: Four times a day (QID) | ORAL | Status: DC | PRN
  Administered 2020-12-27: 650 mg via ORAL
  Filled 2020-12-26: qty 2

## 2020-12-26 MED ORDER — ONDANSETRON HCL 4 MG PO TABS: 4.0000 mg | ORAL_TABLET | Freq: Four times a day (QID) | ORAL | Status: DC | PRN

## 2020-12-26 MED ORDER — METHYLPREDNISOLONE SODIUM SUCC 40 MG IJ SOLR
40.0000 mg | Freq: Two times a day (BID) | INTRAMUSCULAR | Status: DC
  Administered 2020-12-27 – 2020-12-30 (×8): 40 mg via INTRAVENOUS
  Filled 2020-12-26 (×8): qty 1

## 2020-12-26 MED ORDER — LACTATED RINGERS IV BOLUS
1000.0000 mL | Freq: Once | INTRAVENOUS | Status: AC
  Administered 2020-12-26: 1000 mL via INTRAVENOUS

## 2020-12-26 MED ORDER — IOHEXOL 350 MG/ML SOLN
80.0000 mL | Freq: Once | INTRAVENOUS | Status: AC | PRN
  Administered 2020-12-26: 80 mL via INTRAVENOUS

## 2020-12-26 MED ORDER — ACETAMINOPHEN 650 MG RE SUPP: 650.0000 mg | Freq: Four times a day (QID) | RECTAL | Status: DC | PRN

## 2020-12-26 MED ORDER — MORPHINE SULFATE (PF) 4 MG/ML IV SOLN
6.0000 mg | Freq: Once | INTRAVENOUS | Status: AC
  Administered 2020-12-26: 6 mg via INTRAVENOUS
  Filled 2020-12-26: qty 2

## 2020-12-26 MED ORDER — ZOLPIDEM TARTRATE 5 MG PO TABS
5.0000 mg | ORAL_TABLET | Freq: Every evening | ORAL | Status: DC | PRN
  Administered 2020-12-27: 5 mg via ORAL
  Filled 2020-12-26: qty 1

## 2020-12-26 MED ORDER — SODIUM CHLORIDE 0.9 % IV SOLN
8.0000 mg | Freq: Once | INTRAVENOUS | Status: AC
  Administered 2020-12-26: 8 mg via INTRAVENOUS
  Filled 2020-12-26: qty 4

## 2020-12-26 MED ORDER — ENOXAPARIN SODIUM 40 MG/0.4ML IJ SOSY
40.0000 mg | PREFILLED_SYRINGE | INTRAMUSCULAR | Status: DC
  Filled 2020-12-26 (×5): qty 0.4

## 2020-12-26 MED ORDER — KCL IN DEXTROSE-NACL 20-5-0.9 MEQ/L-%-% IV SOLN
INTRAVENOUS | Status: DC
  Filled 2020-12-26 (×3): qty 1000

## 2020-12-26 MED ORDER — METHYLPREDNISOLONE SODIUM SUCC 125 MG IJ SOLR
INTRAMUSCULAR | Status: AC
  Administered 2020-12-26: 125 mg
  Filled 2020-12-26: qty 2

## 2020-12-26 NOTE — H&P (Signed)
ADMISSION HISTORY AND PHYSICAL   Morgan Chung OZD:[id number] DOB: 1985/09/15 DOA: 12/26/2020  PCP: [name], MD Patient coming from: home via ER  Chief Complaint: abdom pain w/ N/V  HPI:  35 year old with a history of Crohn's ileitis diagnosed March 2022 and previously treated with a short course of steroid only who presented to the ER with complaints of severe mid abdominal pain associated with intractable nausea and vomiting progressively worsening for a total of 2 weeks.  This is been associated with very small irregular bowel movements but no melena or hematochezia.  Use of a laxative did not improve her symptoms or increase her stool output.  She presented to the ER.  In the ER she was found to be tender on exam primarily in the epigastrium as well as the right lower quadrant.  CT of the abdomen and pelvis revealed terminal ileitis and a relative obstruction at the level of the ileocecal valve.  GI was consulted and agreed to see the patient and suggested initiation of Solu-Medrol at 40 mg.  Assessment/Plan  Bowel obstruction -fibrotic terminal ileum stricture Treat Crohn's disease and monitor -keep n.p.o. -hopeful symptoms will improve with treatment of inflammatory bowel disease but if not surgical consultation will be required  Crohn's disease with acute flare Solu-Medrol as per GI -care per gastroenterology  Intractable nausea and vomiting Due to above -symptomatic management -n.p.o. only for now with IV fluid support  Microcytic anemia Check anemia panel   DVT prophylaxis: Lovenox Code Status: FULL Family Communication: No family present at time of exam Disposition Plan:  Admit to Inpatient  Consults called: none indicated  Review of Systems: As per HPI otherwise 10 point review of systems negative.   Past Medical History:  Diagnosis Date   Anemia    Asthma    Crohn's disease (HCC)    Gastritis    GERD (gastroesophageal reflux disease)    Insomnia     Past  Surgical History:  Procedure Laterality Date   CESAREAN SECTION      Family History  Family History  Problem Relation Age of Onset   Hypertension Mother    Irritable bowel syndrome Mother    Diabetes Father    Hypertension Father    Irritable bowel syndrome Sister    Colon cancer Neg Hx    Pancreatic cancer Neg Hx    Stomach cancer Neg Hx    Esophageal cancer Neg Hx    Liver cancer Neg Hx    Colon polyps Neg Hx     Social History   reports that she has never smoked. She has never used smokeless tobacco. She reports current alcohol use. She reports that she does not use drugs.  Allergies Allergies  Allergen Reactions   Banana Itching, Swelling and Other (See Comments)    Mouth itches and swells   Other Itching, Swelling and Other (See Comments)    (Fruit) MELON MIX- Mouth itches and swells   Latex Rash    Prior to Admission medications   Medication Sig Start Date End Date Taking? Authorizing Provider  CALCIUM PO Take 1 tablet by mouth daily with breakfast.   Yes [provider]  Cholecalciferol (VITAMIN D-3 PO) Take 1 capsule by mouth daily with breakfast.   Yes [provider]  ferrous sulfate 325 (65 FE) MG tablet Take 325 mg by mouth daily with breakfast.   Yes [provider]  PROAIR HFA 108 (90 Base) MCG/ACT inhaler Inhale 2 puffs into the lungs every  4 (four) hours as needed for wheezing or shortness of breath (ex-induced). 06/11/20  Yes [provider]  zolpidem (AMBIEN) 10 MG tablet Take 10 mg by mouth at bedtime as needed for sleep. 01/09/20  Yes [provider]    Physical Exam: Vitals:   12/26/20 1400 12/26/20 1430 12/26/20 1500 12/26/20 1600  BP: 124/86 (!) 125/95 (!) 147/113 136/90  Pulse: 66 70 75 69  Resp: 18  15 17   Temp:      SpO2: 100% 100% 100% 100%  Weight:      Height:        General: No acute respiratory distress Lungs: Clear to auscultation bilaterally without wheezes or crackles Cardiovascular:  Regular rate and rhythm without murmur gallop or rub normal S1 and S2 Abdomen: Tender diffusely, soft, no rebound, no appreciable mass Extremities: No significant cyanosis, clubbing, or edema bilateral lower extremities   Labs on Admission:   CBC: Recent Labs  Lab 12/26/20 1059  WBC 5.7  NEUTROABS 4.3  HGB 10.6*  HCT 35.5*  MCV 75.9*  PLT 277   Basic Metabolic Panel: Recent Labs  Lab 12/26/20 1059  NA 139  K 3.7  CL 107  CO2 26  GLUCOSE 86  BUN 13  CREATININE 0.87  CALCIUM 9.4   GFR: Estimated Creatinine Clearance: 96.9 mL/min (by C-G formula based on SCr of 0.87 mg/dL). Liver Function Tests: Recent Labs  Lab 12/26/20 1059  AST 20  ALT 14  ALKPHOS 54  BILITOT 0.3  PROT 7.8  ALBUMIN 4.1   Recent Labs  Lab 12/26/20 1059  LIPASE 26    Urine analysis:    Component Value Date/Time   COLORURINE YELLOW 12/26/2020 1110   APPEARANCEUR HAZY (A) 12/26/2020 1110   LABSPEC 1.016 12/26/2020 1110   PHURINE 6.0 12/26/2020 1110   GLUCOSEU NEGATIVE 12/26/2020 1110   HGBUR NEGATIVE 12/26/2020 1110   BILIRUBINUR NEGATIVE 12/26/2020 1110   KETONESUR NEGATIVE 12/26/2020 1110   PROTEINUR NEGATIVE 12/26/2020 1110   NITRITE NEGATIVE 12/26/2020 1110   LEUKOCYTESUR NEGATIVE 12/26/2020 1110     Radiological Exams on Admission: CT ABDOMEN PELVIS W CONTRAST  Result Date: 12/26/2020 CLINICAL DATA:  Crohn's disease. Vomiting. Question obstruction or abscess. Generalized abdominal pain. EXAM: CT ABDOMEN AND PELVIS WITH CONTRAST TECHNIQUE: Multidetector CT imaging of the abdomen and pelvis was performed using the standard protocol following bolus administration of intravenous contrast. CONTRAST:  27mL OMNIPAQUE IOHEXOL 350 MG/ML SOLN COMPARISON:  08/12/2020 FINDINGS: Lower chest: Lung bases are clear.  No pleural or pericardial fluid. Hepatobiliary: Normal Pancreas: Normal Spleen: Normal Adrenals/Urinary Tract: Adrenal glands are normal. Kidneys are normal. Bladder is normal.  Stomach/Bowel: The stomach and proximal small intestine are normal. As seen previously, there is wall thickening of the terminal ileum and right colon as far as the mid ascending colon. Surrounding edema affects the mesentery. There is fecalization material within distended distal ileum, probably secondary to functional obstruction at the ileocecal valve region related to swelling. There is no evidence of perforation or abscess. Vascular/Lymphatic: Normal except for a few small reactive right lower quadrant mesentery nodes. Reproductive: Normal Other: Small amount of free fluid in the pelvic cul-de-sac. No evidence of loculated collection or abscess. Musculoskeletal: Lower lumbar facet osteoarthritis. No acute finding IMPRESSION: Inflammatory wall thickening of the terminal ileum and right colon consistent with Crohn's enteritis. Relative obstruction at the ileocecal valve region due to swelling with the distal terminal ileum being dilated and full of fecalized material. Electronically Signed   By: 08/14/2020  Shogry M.D.   On: 12/26/2020 14:08      Lonia Blood, MD [facility] Office  [phone] Pager - Text Page per Amion as per below:  On-Call/Text Page:      [email or web address]  If 7PM-7AM, please contact night-coverage [email or web address] 12/26/2020, 7:07 PM

## 2020-12-26 NOTE — Consult Note (Signed)
Reason for Consult: Crohn's disease Referring Physician: [facility]  Lieutenant Diego HPI: This is a 35 year old female with a PMH of Crohn's ileitis diagnosed 07/2020 admitted for complaints of mid abdominal pain, nausea, and vomiting.  The patient reports this type of abdominal pain for quite some time.  She was evaluated by Dr. Lavon Paganini 02/2020 with an EGD and a mild nonspecific gastritis was identified.  In March this year Dr. [name] performed a colonoscopy for an abnormal CT scan performed on 07/30/2020.  The colonoscopy was positive for a stricture in the TI and it was measured to be 6 mm.  The ICV was also deformed.  The TI stricture was felt to be fibrotic.  The biopsies were consistent with Crohn's disease and she was advised to start on Remicade and AZA.  While she was in the hospital she was treated with Solumedrol.  She sought a second opinion from [facility] and she is currently scheduled for an EGD/colonoscopy in October.  It was felt that she did have Crohn's disease at [facility], but she reported using a significant amount of BC powders.  The plan was to stop using any NSAIDS and to repeat the procedures to ensure that no other etiologies were causing her problems.  The admission CT scan shows the thickened TI with stricturing and thickening in the ascending colon.  The findings are consistent with Crohn's disease.  Past Medical History:  Diagnosis Date   Anemia    Asthma    Crohn's disease (HCC)    Gastritis    GERD (gastroesophageal reflux disease)    Insomnia     Past Surgical History:  Procedure Laterality Date   CESAREAN SECTION      Family History  Problem Relation Age of Onset   Hypertension Mother    Irritable bowel syndrome Mother    Diabetes Father    Hypertension Father    Irritable bowel syndrome Sister    Colon cancer Neg Hx    Pancreatic cancer Neg Hx    Stomach cancer Neg Hx    Esophageal cancer Neg Hx    Liver cancer Neg Hx    Colon polyps Neg Hx      Social History:  reports that she has never smoked. She has never used smokeless tobacco. She reports current alcohol use. She reports that she does not use drugs.  Allergies:  Allergies  Allergen Reactions   Latex Rash    Medications: Scheduled: Continuous:  Results for orders placed or performed during the hospital encounter of 12/26/20 (from the past 24 hour(s))  CBC with Differential     Status: Abnormal   Collection Time: 12/26/20 10:59 AM  Result Value Ref Range   WBC 5.7 4.0 - 10.5 K/uL   RBC 4.68 3.87 - 5.11 MIL/uL   Hemoglobin 10.6 (L) 12.0 - 15.0 g/dL   HCT 14.9 (L) 70.2 - 63.7 %   MCV 75.9 (L) 80.0 - 100.0 fL   MCH 22.6 (L) 26.0 - 34.0 pg   MCHC 29.9 (L) 30.0 - 36.0 g/dL   RDW 85.8 (H) 85.0 - 27.7 %   Platelets 277 150 - 400 K/uL   nRBC 0.0 0.0 - 0.2 %   Neutrophils Relative % 76 %   Neutro Abs 4.3 1.7 - 7.7 K/uL   Lymphocytes Relative 20 %   Lymphs Abs 1.1 0.7 - 4.0 K/uL   Monocytes Relative 3 %   Monocytes Absolute 0.2 0.1 - 1.0 K/uL   Eosinophils  Relative 1 %   Eosinophils Absolute 0.0 0.0 - 0.5 K/uL   Basophils Relative 0 %   Basophils Absolute 0.0 0.0 - 0.1 K/uL   Immature Granulocytes 0 %   Abs Immature Granulocytes 0.01 0.00 - 0.07 K/uL  Comprehensive metabolic panel     Status: None   Collection Time: 12/26/20 10:59 AM  Result Value Ref Range   Sodium 139 135 - 145 mmol/L   Potassium 3.7 3.5 - 5.1 mmol/L   Chloride 107 98 - 111 mmol/L   CO2 26 22 - 32 mmol/L   Glucose, Bld 86 70 - 99 mg/dL   BUN 13 6 - 20 mg/dL   Creatinine, Ser 8.25 0.44 - 1.00 mg/dL   Calcium 9.4 8.9 - 00.3 mg/dL   Total Protein 7.8 6.5 - 8.1 g/dL   Albumin 4.1 3.5 - 5.0 g/dL   AST 20 15 - 41 U/L   ALT 14 0 - 44 U/L   Alkaline Phosphatase 54 38 - 126 U/L   Total Bilirubin 0.3 0.3 - 1.2 mg/dL   GFR, Estimated >70 >48 mL/min   Anion gap 6 5 - 15  Lipase, blood     Status: None   Collection Time: 12/26/20 10:59 AM  Result Value Ref Range   Lipase 26 11 - 51 U/L   Sedimentation rate     Status: None   Collection Time: 12/26/20 10:59 AM  Result Value Ref Range   Sed Rate 20 0 - 22 mm/hr  C-reactive protein     Status: None   Collection Time: 12/26/20 10:59 AM  Result Value Ref Range   CRP 0.8 <1.0 mg/dL  Urinalysis, Routine w reflex microscopic     Status: Abnormal   Collection Time: 12/26/20 11:10 AM  Result Value Ref Range   Color, Urine YELLOW YELLOW   APPearance HAZY (A) CLEAR   Specific Gravity, Urine 1.016 1.005 - 1.030   pH 6.0 5.0 - 8.0   Glucose, UA NEGATIVE NEGATIVE mg/dL   Hgb urine dipstick NEGATIVE NEGATIVE   Bilirubin Urine NEGATIVE NEGATIVE   Ketones, ur NEGATIVE NEGATIVE mg/dL   Protein, ur NEGATIVE NEGATIVE mg/dL   Nitrite NEGATIVE NEGATIVE   Leukocytes,Ua NEGATIVE NEGATIVE  I-Stat Beta hCG blood, ED ([facility], [facility], [facility] only)     Status: None   Collection Time: 12/26/20 12:47 PM  Result Value Ref Range   I-stat hCG, quantitative <5.0 <5 mIU/mL   Comment 3             CT ABDOMEN PELVIS W CONTRAST  Result Date: 12/26/2020 CLINICAL DATA:  Crohn's disease. Vomiting. Question obstruction or abscess. Generalized abdominal pain. EXAM: CT ABDOMEN AND PELVIS WITH CONTRAST TECHNIQUE: Multidetector CT imaging of the abdomen and pelvis was performed using the standard protocol following bolus administration of intravenous contrast. CONTRAST:  58mL OMNIPAQUE IOHEXOL 350 MG/ML SOLN COMPARISON:  08/12/2020 FINDINGS: Lower chest: Lung bases are clear.  No pleural or pericardial fluid. Hepatobiliary: Normal Pancreas: Normal Spleen: Normal Adrenals/Urinary Tract: Adrenal glands are normal. Kidneys are normal. Bladder is normal. Stomach/Bowel: The stomach and proximal small intestine are normal. As seen previously, there is wall thickening of the terminal ileum and right colon as far as the mid ascending colon. Surrounding edema affects the mesentery. There is fecalization material within distended distal ileum, probably secondary to functional  obstruction at the ileocecal valve region related to swelling. There is no evidence of perforation or abscess. Vascular/Lymphatic: Normal except for a few small reactive right lower quadrant  mesentery nodes. Reproductive: Normal Other: Small amount of free fluid in the pelvic cul-de-sac. No evidence of loculated collection or abscess. Musculoskeletal: Lower lumbar facet osteoarthritis. No acute finding IMPRESSION: Inflammatory wall thickening of the terminal ileum and right colon consistent with Crohn's enteritis. Relative obstruction at the ileocecal valve region due to swelling with the distal terminal ileum being dilated and full of fecalized material. Electronically Signed   By: Paulina Fusi M.D.   On: 12/26/2020 14:08    ROS:  As stated above in the HPI otherwise negative.  Blood pressure 136/90, pulse 69, temperature 98 F (36.7 C), resp. rate 17, height 5\' 5"  (1.651 m), weight 83 kg, last menstrual period 12/09/2020, SpO2 100 %.    PE: Gen: NAD, Alert and Oriented HEENT:  [place]/AT, EOMI Neck: Supple, no LAD Lungs: CTA Bilaterally CV: RRR without M/G/R ABD: Soft, periumbilical tenderness, +BS Ext: No C/C/E  Assessment/Plan: 1) Crohn's disease. 2) TI stricture - fibrotic. 3) Periumbilical abdominal pain. 4) Nausea/vomiting.   The current presentation is consistent with Crohn's disease.  Off of any NSAIDS she now exhibits inflammation in the ascending colon, which was not previously noted.  This was explained to her and she understands the thought process with the diagnosis of Crohn's disease.  It is not clear from the prior CT scan and the current CT scan if her stricture is worse.  She did respond to Solumedrol, but she never pursued any biologic treatment.  She will be retreated with Solumedrol and her clinical progress will be followed.  If there is no improvement a surgical consultation will be required.  If she does respond to treatment she can follow up with [facility] for further  management, which is biologic treatment.  Plan: 1) Solumedrol. 2) NPO.  Aleese Kamps D 12/26/2020, 5:49 PM

## 2020-12-26 NOTE — ED Provider Notes (Signed)
[facility] Note   CSN: [id number] Arrival date & time: 12/26/20  0946     History Chief Complaint  Patient presents with   Abdominal Pain    Morgan Chung is a 35 y.o. female with PMH Crohn's disease on no home therapy, anemia, asthma who presents to the emergency department for evaluation of abdominal pain, nausea and vomiting.  Patient states that for the last 2 weeks she has had intermittent abdominal pain, worse after eating with associated vomiting.  She states that she has had very small irregular bowel movements but no hematochezia.  She states she spoke with her primary physician who was concerned about partial small bowel obstruction and she took a laxative 4 days ago which did not improve her symptoms.  She states she is having a lot of difficulty tolerating p.o. as she is continually vomiting.  Denies fever, chest pain, shortness of breath, headache, dysuria or other systemic symptoms.   Abdominal Pain Associated symptoms: nausea and vomiting   Associated symptoms: no chest pain, no chills, no cough, no dysuria, no fever, no hematuria, no shortness of breath and no sore throat       Past Medical History:  Diagnosis Date   Anemia    Asthma    Crohn's disease (HCC)    Gastritis    GERD (gastroesophageal reflux disease)    Insomnia     Patient Active Problem List   Diagnosis Date Noted   Bowel obstruction (HCC) 12/26/2020   Constipation 08/23/2020   Nausea and vomiting 08/23/2020   Abdominal pain 08/07/2020   Insomnia 08/07/2020   Microcytic anemia 08/07/2020   Crohn's disease of small intestine with complication [facility])     Past Surgical History:  Procedure Laterality Date   CESAREAN SECTION       OB History   No obstetric history on file.     Family History  Problem Relation Age of Onset   Hypertension Mother    Irritable bowel syndrome Mother    Diabetes Father    Hypertension Father    Irritable bowel  syndrome Sister    Colon cancer Neg Hx    Pancreatic cancer Neg Hx    Stomach cancer Neg Hx    Esophageal cancer Neg Hx    Liver cancer Neg Hx    Colon polyps Neg Hx     Social History   Tobacco Use   Smoking status: Never   Smokeless tobacco: Never  Vaping Use   Vaping Use: Never used  Substance Use Topics   Alcohol use: Yes    Comment: occasionally ( 3 times per month)   Drug use: Never    Home Medications Prior to Admission medications   Medication Sig Start Date End Date Taking? Authorizing Provider  PROAIR HFA 108 [phone] Base) MCG/ACT inhaler Inhale 1 puff into the lungs every 4 (four) hours as needed for wheezing or shortness of breath. 06/11/20   [provider]  zolpidem (AMBIEN) 10 MG tablet Take 10 mg by mouth at bedtime as needed for sleep. 01/09/20   [provider]    Allergies    Latex  Review of Systems   Review of Systems  Constitutional:  Negative for chills and fever.  HENT:  Negative for ear pain and sore throat.   Eyes:  Negative for pain and visual disturbance.  Respiratory:  Negative for cough and shortness of breath.   Cardiovascular:  Negative for chest pain and palpitations.  Gastrointestinal:  Positive for abdominal pain, nausea and vomiting.  Genitourinary:  Negative for dysuria and hematuria.  Musculoskeletal:  Negative for arthralgias and back pain.  Skin:  Negative for color change and rash.  Neurological:  Negative for seizures and syncope.  All other systems reviewed and are negative.  Physical Exam Updated Vital Signs BP 136/90   Pulse 69   Temp 98 F (36.7 C)   Resp 17   Ht 5\' 5"  (1.651 m)   Wt 83 kg   LMP 12/09/2020   SpO2 100%   BMI 30.45 kg/m   Physical Exam Vitals and nursing note reviewed.  Constitutional:      General: She is not in acute distress.    Appearance: She is well-developed.  HENT:     Head: Normocephalic and atraumatic.  Eyes:     Conjunctiva/sclera: Conjunctivae normal.   Cardiovascular:     Rate and Rhythm: Normal rate and regular rhythm.     Heart sounds: No murmur heard. Pulmonary:     Effort: Pulmonary effort is normal. No respiratory distress.     Breath sounds: Normal breath sounds.  Abdominal:     Palpations: Abdomen is soft.     Tenderness: There is abdominal tenderness in the epigastric area.  Musculoskeletal:     Cervical back: Neck supple.  Skin:    General: Skin is warm and dry.  Neurological:     Mental Status: She is alert.    ED Results / Procedures / Treatments   Labs (all labs ordered are listed, but only abnormal results are displayed) Labs Reviewed  CBC WITH DIFFERENTIAL/PLATELET - Abnormal; Notable for the following components:      Result Value   Hemoglobin 10.6 (*)    HCT 35.5 (*)    MCV 75.9 (*)    MCH 22.6 (*)    MCHC 29.9 (*)    RDW 16.3 (*)    All other components within normal limits  URINALYSIS, ROUTINE W REFLEX MICROSCOPIC - Abnormal; Notable for the following components:   APPearance HAZY (*)    All other components within normal limits  COMPREHENSIVE METABOLIC PANEL  LIPASE, BLOOD  SEDIMENTATION RATE  C-REACTIVE PROTEIN  I-STAT BETA HCG BLOOD, ED ([facility], [facility], [facility] ONLY)    EKG None  Radiology CT ABDOMEN PELVIS W CONTRAST  Result Date: 12/26/2020 CLINICAL DATA:  Crohn's disease. Vomiting. Question obstruction or abscess. Generalized abdominal pain. EXAM: CT ABDOMEN AND PELVIS WITH CONTRAST TECHNIQUE: Multidetector CT imaging of the abdomen and pelvis was performed using the standard protocol following bolus administration of intravenous contrast. CONTRAST:  87mL OMNIPAQUE IOHEXOL 350 MG/ML SOLN COMPARISON:  08/12/2020 FINDINGS: Lower chest: Lung bases are clear.  No pleural or pericardial fluid. Hepatobiliary: Normal Pancreas: Normal Spleen: Normal Adrenals/Urinary Tract: Adrenal glands are normal. Kidneys are normal. Bladder is normal. Stomach/Bowel: The stomach and proximal small intestine are normal. As seen  previously, there is wall thickening of the terminal ileum and right colon as far as the mid ascending colon. Surrounding edema affects the mesentery. There is fecalization material within distended distal ileum, probably secondary to functional obstruction at the ileocecal valve region related to swelling. There is no evidence of perforation or abscess. Vascular/Lymphatic: Normal except for a few small reactive right lower quadrant mesentery nodes. Reproductive: Normal Other: Small amount of free fluid in the pelvic cul-de-sac. No evidence of loculated collection or abscess. Musculoskeletal: Lower lumbar facet osteoarthritis. No acute finding IMPRESSION: Inflammatory wall thickening of the terminal ileum and right colon consistent  with Crohn's enteritis. Relative obstruction at the ileocecal valve region due to swelling with the distal terminal ileum being dilated and full of fecalized material. Electronically Signed   By: Paulina Fusi M.D.   On: 12/26/2020 14:08    Procedures Procedures   Medications Ordered in ED Medications  ondansetron (ZOFRAN) 8 mg in sodium chloride 0.9 % 50 mL IVPB (0 mg Intravenous Stopped 12/26/20 1149)  morphine 4 MG/ML injection 4 mg (4 mg Intravenous Given 12/26/20 1205)  lactated ringers bolus 1,000 mL (1,000 mLs Intravenous New Bag/Given 12/26/20 1127)  iohexol (OMNIPAQUE) 350 MG/ML injection 80 mL (80 mLs Intravenous Contrast Given 12/26/20 1323)  morphine 4 MG/ML injection 6 mg (6 mg Intravenous Given 12/26/20 1452)  methylPREDNISolone sodium succinate (SOLU-MEDROL) 40 mg/mL injection 40 mg (40 mg Intravenous Given 12/26/20 1506)    ED Course  I have reviewed the triage vital signs and the nursing notes.  Pertinent labs & imaging results that were available during my care of the patient were reviewed by me and considered in my medical decision making (see chart for details).    MDM Rules/Calculators/A&P                           Patient seen emergency department  for evaluation of abdominal pain.  Physical exam reveals tenderness in the epigastrium and in the right lower quadrant.  Laboratory evaluation largely unremarkable outside of an anemia to 10.6 with an MCV of 75.9.  CT with evidence of terminal ileitis and a relative obstruction at the ileocecal valve.  GI consulted who recommended steroid initiation and 40 mg Solu-Medrol started.  Patient pain controlled with morphine and nausea control with Zofran and patient was admitted to medicine for further management of her likely Crohn's flare. Final Clinical Impression(s) / ED Diagnoses Final diagnoses:  None    Rx / DC Orders ED Discharge Orders     None        Kadisha Goodine, MD 12/26/20 1745

## 2020-12-26 NOTE — ED Triage Notes (Addendum)
Pt reports generalized abd pain with po intake and with movement, dizziness, N/V x2 weeks. Patient reports irregular BM and took laxative 4 days ago with no results.

## 2020-12-27 LAB — VITAMIN B12: Vitamin B-12: 489 pg/mL (ref 180–914)

## 2020-12-27 LAB — CBC
HCT: 33.9 % — ABNORMAL LOW (ref 36.0–46.0)
Hemoglobin: 9.7 g/dL — ABNORMAL LOW (ref 12.0–15.0)
MCH: 21.7 pg — ABNORMAL LOW (ref 26.0–34.0)
MCHC: 28.6 g/dL — ABNORMAL LOW (ref 30.0–36.0)
MCV: 76 fL — ABNORMAL LOW (ref 80.0–100.0)
Platelets: 269 10*3/uL (ref 150–400)
RBC: 4.46 MIL/uL (ref 3.87–5.11)
RDW: 16.1 % — ABNORMAL HIGH (ref 11.5–15.5)
WBC: 8.5 10*3/uL (ref 4.0–10.5)
nRBC: 0 % (ref 0.0–0.2)

## 2020-12-27 LAB — MAGNESIUM: Magnesium: 2.1 mg/dL (ref 1.7–2.4)

## 2020-12-27 LAB — COMPREHENSIVE METABOLIC PANEL
ALT: 13 U/L (ref 0–44)
AST: 16 U/L (ref 15–41)
Albumin: 3.8 g/dL (ref 3.5–5.0)
Alkaline Phosphatase: 51 U/L (ref 38–126)
Anion gap: 7 (ref 5–15)
BUN: 10 mg/dL (ref 6–20)
CO2: 24 mmol/L (ref 22–32)
Calcium: 9.3 mg/dL (ref 8.9–10.3)
Chloride: 104 mmol/L (ref 98–111)
Creatinine, Ser: 0.78 mg/dL (ref 0.44–1.00)
GFR, Estimated: >60 mL/min (ref >60)
Glucose, Bld: 171 mg/dL — ABNORMAL HIGH (ref 70–99)
Potassium: 4.1 mmol/L (ref 3.5–5.1)
Sodium: 135 mmol/L (ref 135–145)
Total Bilirubin: 0.3 mg/dL (ref 0.3–1.2)
Total Protein: 7.3 g/dL (ref 6.5–8.1)

## 2020-12-27 LAB — IRON AND TIBC
Iron: 21 ug/dL — ABNORMAL LOW (ref 28–170)
Saturation Ratios: 5 % — ABNORMAL LOW (ref 10.4–31.8)
TIBC: 385 ug/dL (ref 250–450)
UIBC: 364 ug/dL

## 2020-12-27 LAB — RETICULOCYTES
Immature Retic Fract: 14.9 % (ref 2.3–15.9)
RBC.: 4.43 MIL/uL (ref 3.87–5.11)
Retic Count, Absolute: 74 10*3/uL (ref 19.0–186.0)
Retic Ct Pct: 1.7 % (ref 0.4–3.1)

## 2020-12-27 LAB — PHOSPHORUS: Phosphorus: 2.7 mg/dL (ref 2.5–4.6)

## 2020-12-27 LAB — GLUCOSE, CAPILLARY: Glucose-Capillary: 150 mg/dL — ABNORMAL HIGH (ref 70–99)

## 2020-12-27 LAB — FERRITIN: Ferritin: 10 ng/mL — ABNORMAL LOW (ref 11–307)

## 2020-12-27 LAB — FOLATE: Folate: 9.1 ng/mL (ref >5.9)

## 2020-12-27 MED ORDER — BOOST / RESOURCE BREEZE PO LIQD CUSTOM
1.0000 | Freq: Three times a day (TID) | ORAL | Status: DC
  Administered 2020-12-28 – 2020-12-29 (×4): 1 via ORAL

## 2020-12-27 MED ORDER — PROSOURCE PLUS PO LIQD
30.0000 mL | Freq: Three times a day (TID) | ORAL | Status: DC
  Administered 2020-12-28 (×3): 30 mL via ORAL
  Filled 2020-12-27 (×5): qty 30

## 2020-12-27 MED ORDER — MORPHINE SULFATE (PF) 4 MG/ML IV SOLN
4.0000 mg | INTRAVENOUS | Status: DC | PRN
  Administered 2020-12-27 – 2020-12-28 (×2): 4 mg via INTRAVENOUS
  Filled 2020-12-27 (×2): qty 1

## 2020-12-27 MED ORDER — ADULT MULTIVITAMIN W/MINERALS CH
1.0000 | ORAL_TABLET | Freq: Every day | ORAL | Status: DC
  Administered 2020-12-28 – 2020-12-30 (×3): 1 via ORAL
  Filled 2020-12-27 (×3): qty 1

## 2020-12-27 MED ORDER — BOOST / RESOURCE BREEZE PO LIQD CUSTOM: 1.0000 | Freq: Three times a day (TID) | ORAL | Status: DC

## 2020-12-27 MED ORDER — SODIUM CHLORIDE 0.9 % IV SOLN: INTRAVENOUS | Status: DC

## 2020-12-27 MED ORDER — PROSOURCE PLUS PO LIQD: 30.0000 mL | Freq: Three times a day (TID) | ORAL | Status: DC

## 2020-12-27 NOTE — Plan of Care (Signed)

## 2020-12-27 NOTE — Progress Notes (Signed)
Initial Nutrition Assessment  DOCUMENTATION CODES:  Not applicable  INTERVENTION:  Advance diet as medically able and as tolerated.  Add Boost Breeze po TID, each supplement provides 250 kcal and 9 grams of protein.  Add 30 ml ProSource Plus po TID, each supplement provides 100 kcal and 15 grams of protein.    Add MVI with minerals daily.  NUTRITION DIAGNOSIS:  Increased nutrient needs related to acute illness (Crohn's disease flare) as evidenced by estimated needs.  GOAL:  Patient will meet greater than or equal to 90% of their needs  MONITOR:  Diet advancement, PO intake, Supplement acceptance, Labs, Weight trends, I & O's  REASON FOR ASSESSMENT:  Malnutrition Screening Tool    ASSESSMENT:  35 yo female with a PMH of Crohn's ileitis diagnosed March 2022 and previously treated with a short course of steroid who presented to the ER with complaints of severe mid abdominal pain associated with intractable nausea and vomiting progressively worsening for a total of 2 weeks.  This is been associated with very small irregular bowel movements but no melena or hematochezia.  CT of the abdomen and pelvis revealed terminal ileitis and a relative obstruction at the level of the ileocecal valve.  RD working remotely. Attempted to call patient's room phone. Pt did not answer.  Per Epic, pt has lost 22.5 lbs (11%) in the last 4 months, which is significant and severe for the time frame.  Recommend adding Boost Breeze TID, ProSource Plus TID, and MVI with minerals daily once diet advances.  Medications: reviewed; Solu-Medrol BID via IV, NaCl @ 100 ml/hr via IV, Zofran PRN (given once today)  Labs: reviewed; Glucose 171 (H) HbA1c: 5.4% (11/2020)  NUTRITION - FOCUSED PHYSICAL EXAM: Unable to perform  Diet Order:   Diet Order             Diet NPO time specified Except for: Sips with Meds, Ice Chips  Diet effective now                  EDUCATION NEEDS:  No education needs have  been identified at this time  Skin:  Skin Assessment: Reviewed RN Assessment  Last BM:  12/23/20  Height:  Ht Readings from Last 1 Encounters:  12/26/20 5\' 5"  (1.651 m)   Weight:  Wt Readings from Last 1 Encounters:  12/26/20 83 kg   BMI:  Body mass index is 30.45 kg/m.  Estimated Nutritional Needs:  Kcal:  2100-2300 Protein:  95-110 grams Fluid:  >2.1 L  02/25/21, RD, LDN (she/her/hers) Registered Dietitian I After-Hours/Weekend Pager # in [place]

## 2020-12-27 NOTE — Progress Notes (Signed)
PROGRESS NOTE    Morgan Chung  NLG:[id number] DOB: [birth year] DOA: 12/26/2020 PCP: [name], MD     Brief Narrative:  Morgan Chung is a 35 year old with a history of Crohn's ileitis diagnosed March 2022 and previously treated with a short course of steroid who presented to the ER with complaints of severe mid abdominal pain associated with intractable nausea and vomiting progressively worsening for a total of 2 weeks.  This is been associated with very small irregular bowel movements but no melena or hematochezia.  CT of the abdomen and pelvis revealed terminal ileitis and a relative obstruction at the level of the ileocecal valve.  GI was consulted and suggested initiation of Solu-Medrol.  New events last 24 hours / Subjective: Continues to have periumbilical abdominal pain as well as nausea.  Tylenol has not helped with her pain.  Nausea well controlled with Zofran.  Assessment & Plan:   Principal Problem:   Crohn's disease of small intestine with complication (HCC) Active Problems:   Abdominal pain   Insomnia   Microcytic anemia   Nausea and vomiting   Bowel obstruction (HCC)   Crohn's flare with terminal ileum stricture -GI following -Continue Solu-Medrol  Iron deficiency anemia -Hemoglobin remains stable -Will need iron supplementation  DVT prophylaxis:  enoxaparin (LOVENOX) injection 40 mg Start: 12/26/20 2200  Code Status:     Code Status Orders  (From admission, onward)           Start     Ordered   12/26/20 1916  Full code  Continuous        12/26/20 1916           Code Status History     Date Active Date Inactive Code Status Order ID Comments User Context   08/07/2020 1348 08/14/2020 2128 Full Code [id number]  Jae Dire, MD ED      Family Communication: No family at bedside Disposition Plan:  Status is: Inpatient  Remains inpatient appropriate because:IV treatments appropriate due to intensity of illness or inability to take PO  Dispo:  The patient is from: Home              Anticipated d/c is to: Home              Patient currently is not medically stable to d/c.   Difficult to place patient No      Consultants:  GI   Antimicrobials:  Anti-infectives (From admission, onward)    None        Objective: Vitals:   12/26/20 2000 12/26/20 2130 12/27/20 0034 12/27/20 0526  BP: 133/86 (!) 138/104 (!) 141/99 (!) 125/92  Pulse: 63 66 64 70  Resp: 15 14 18 16   Temp:   98.5 F (36.9 C) 97.8 F (36.6 C)  TempSrc:   Oral Oral  SpO2: 99% 97% 100% 99%  Weight:      Height:        Intake/Output Summary (Last 24 hours) at 12/27/2020 1235 Last data filed at 12/27/2020 1000 Gross per 24 hour  Intake 1670.64 ml  Output 700 ml  Net 970.64 ml   Filed Weights   12/26/20 0957  Weight: 83 kg    Examination:  General exam: Appears calm and comfortable  Respiratory system: Clear to auscultation. Respiratory effort normal. No respiratory distress. No conversational dyspnea.  Cardiovascular system: S1 & S2 heard, RRR. No murmurs. No pedal edema. Gastrointestinal system: Abdomen is nondistended, soft, mildly tender to palpation periumbilical, bowel sounds heard  Central nervous system: Alert and oriented. No focal neurological deficits. Speech clear.  Extremities: Symmetric in appearance  Skin: No rashes, lesions or ulcers on exposed skin  Psychiatry: Judgement and insight appear normal. Mood & affect appropriate.   Data Reviewed: I have personally reviewed following labs and imaging studies  CBC: Recent Labs  Lab 12/26/20 1059 12/27/20 0453  WBC 5.7 8.5  NEUTROABS 4.3  --   HGB 10.6* 9.7*  HCT 35.5* 33.9*  MCV 75.9* 76.0*  PLT 277 269   Basic Metabolic Panel: Recent Labs  Lab 12/26/20 1059 12/27/20 0453  NA 139 135  K 3.7 4.1  CL 107 104  CO2 26 24  GLUCOSE 86 171*  BUN 13 10  CREATININE 0.87 0.78  CALCIUM 9.4 9.3  MG  --  2.1  PHOS  --  2.7   GFR: Estimated Creatinine Clearance: 105.4  mL/min (by C-G formula based on SCr of 0.78 mg/dL). Liver Function Tests: Recent Labs  Lab 12/26/20 1059 12/27/20 0453  AST 20 16  ALT 14 13  ALKPHOS 54 51  BILITOT 0.3 0.3  PROT 7.8 7.3  ALBUMIN 4.1 3.8   Recent Labs  Lab 12/26/20 1059  LIPASE 26   No results for input(s): AMMONIA in the last 168 hours. Coagulation Profile: No results for input(s): INR, PROTIME in the last 168 hours. Cardiac Enzymes: No results for input(s): CKTOTAL, CKMB, CKMBINDEX, TROPONINI in the last 168 hours. BNP (last 3 results) No results for input(s): PROBNP in the last 8760 hours. HbA1C: No results for input(s): HGBA1C in the last 72 hours. CBG: Recent Labs  Lab 12/27/20 0930  GLUCAP 150*   Lipid Profile: No results for input(s): CHOL, HDL, LDLCALC, TRIG, CHOLHDL, LDLDIRECT in the last 72 hours. Thyroid Function Tests: No results for input(s): TSH, T4TOTAL, FREET4, T3FREE, THYROIDAB in the last 72 hours. Anemia Panel: Recent Labs    12/27/20 0453  VITAMINB12 489  FOLATE 9.1  FERRITIN 10*  TIBC 385  IRON 21*  RETICCTPCT 1.7   Sepsis Labs: No results for input(s): PROCALCITON, LATICACIDVEN in the last 168 hours.  Recent Results (from the past 240 hour(s))  Resp Panel by RT-PCR (Flu A&B, Covid) Nasopharyngeal Swab     Status: None   Collection Time: 12/26/20  8:28 PM   Specimen: Nasopharyngeal Swab; Nasopharyngeal(NP) swabs in vial transport medium  Result Value Ref Range Status   SARS Coronavirus 2 by RT PCR NEGATIVE NEGATIVE Final    Comment: (NOTE) SARS-CoV-2 target nucleic acids are NOT DETECTED.  The SARS-CoV-2 RNA is generally detectable in upper respiratory specimens during the acute phase of infection. The lowest concentration of SARS-CoV-2 viral copies this assay can detect is 138 copies/mL. A negative result does not preclude SARS-Cov-2 infection and should not be used as the sole basis for treatment or other patient management decisions. A negative result may occur  with  improper specimen collection/handling, submission of specimen other than nasopharyngeal swab, presence of viral mutation(s) within the areas targeted by this assay, and inadequate number of viral copies(<138 copies/mL). A negative result must be combined with clinical observations, patient history, and epidemiological information. The expected result is Negative.  Fact Sheet for Patients:  BloggerCourse.com  Fact Sheet for Healthcare Providers:  SeriousBroker.it  This test is no t yet approved or cleared by the [place] FDA and  has been authorized for detection and/or diagnosis of SARS-CoV-2 by FDA under an Emergency Use Authorization (EUA). This EUA will remain  in effect (meaning  this test can be used) for the duration of the COVID-19 declaration under Section 564(b)(1) of the Act, 21 U.S.C.section 360bbb-3(b)(1), unless the authorization is terminated  or revoked sooner.       Influenza A by PCR NEGATIVE NEGATIVE Final   Influenza B by PCR NEGATIVE NEGATIVE Final    Comment: (NOTE) The Xpert Xpress SARS-CoV-2/FLU/RSV plus assay is intended as an aid in the diagnosis of influenza from Nasopharyngeal swab specimens and should not be used as a sole basis for treatment. Nasal washings and aspirates are unacceptable for Xpert Xpress SARS-CoV-2/FLU/RSV testing.  Fact Sheet for Patients: BloggerCourse.com  Fact Sheet for Healthcare Providers: SeriousBroker.it  This test is not yet approved or cleared by the [place] FDA and has been authorized for detection and/or diagnosis of SARS-CoV-2 by FDA under an Emergency Use Authorization (EUA). This EUA will remain in effect (meaning this test can be used) for the duration of the COVID-19 declaration under Section 564(b)(1) of the Act, 21 U.S.C. section 360bbb-3(b)(1), unless the authorization is terminated  or revoked.  Performed at [facility], [address] [place], [place] [address]       Radiology Studies: CT ABDOMEN PELVIS W CONTRAST  Result Date: 12/26/2020 CLINICAL DATA:  Crohn's disease. Vomiting. Question obstruction or abscess. Generalized abdominal pain. EXAM: CT ABDOMEN AND PELVIS WITH CONTRAST TECHNIQUE: Multidetector CT imaging of the abdomen and pelvis was performed using the standard protocol following bolus administration of intravenous contrast. CONTRAST:  75mL OMNIPAQUE IOHEXOL 350 MG/ML SOLN COMPARISON:  08/12/2020 FINDINGS: Lower chest: Lung bases are clear.  No pleural or pericardial fluid. Hepatobiliary: Normal Pancreas: Normal Spleen: Normal Adrenals/Urinary Tract: Adrenal glands are normal. Kidneys are normal. Bladder is normal. Stomach/Bowel: The stomach and proximal small intestine are normal. As seen previously, there is wall thickening of the terminal ileum and right colon as far as the mid ascending colon. Surrounding edema affects the mesentery. There is fecalization material within distended distal ileum, probably secondary to functional obstruction at the ileocecal valve region related to swelling. There is no evidence of perforation or abscess. Vascular/Lymphatic: Normal except for a few small reactive right lower quadrant mesentery nodes. Reproductive: Normal Other: Small amount of free fluid in the pelvic cul-de-sac. No evidence of loculated collection or abscess. Musculoskeletal: Lower lumbar facet osteoarthritis. No acute finding IMPRESSION: Inflammatory wall thickening of the terminal ileum and right colon consistent with Crohn's enteritis. Relative obstruction at the ileocecal valve region due to swelling with the distal terminal ileum being dilated and full of fecalized material. Electronically Signed   By: Paulina Fusi M.D.   On: 12/26/2020 14:08      Scheduled Meds:  enoxaparin (LOVENOX) injection  40 mg Subcutaneous Q24H    methylPREDNISolone (SOLU-MEDROL) injection  40 mg Intravenous Q12H   Continuous Infusions:  dextrose 5 % and 0.9 % NaCl with KCl 20 mEq/L 100 mL/hr at 12/27/20 0737     LOS: 1 day      Time spent: 30 minutes   Noralee Stain, DO [facility] 12/27/2020, 12:35 PM   Available via Epic secure chat 7am-7pm After these hours, please refer to coverage provider listed on [email or web address]

## 2020-12-27 NOTE — Progress Notes (Signed)
Subjective: Nausea is persistent, but it is controlled with meds.  Objective: Vital signs in last 24 hours: Temp:  [97.8 F (36.6 C)-98.5 F (36.9 C)] 97.8 F (36.6 C) (08/12 0526) Pulse Rate:  [63-97] 70 (08/12 0526) Resp:  [14-18] 16 (08/12 0526) BP: (104-147)/(66-113) 125/92 (08/12 0526) SpO2:  [97 %-100 %] 99 % (08/12 0526) Weight:  [83 kg] 83 kg (08/11 0957) Last BM Date: 12/23/20  Intake/Output from previous day: 08/11 0701 - 08/12 0700 In: 1724.6 [P.O.:60; I.V.:610.6; IV Piggyback:1054] Out: 200 [Urine:200] Intake/Output this shift: No intake/output data recorded.  General appearance: alert and no distress GI: Periumbilical tenderness  Lab Results: Recent Labs    12/26/20 1059 12/27/20 0453  WBC 5.7 8.5  HGB 10.6* 9.7*  HCT 35.5* 33.9*  PLT 277 269   BMET Recent Labs    12/26/20 1059 12/27/20 0453  NA 139 135  K 3.7 4.1  CL 107 104  CO2 26 24  GLUCOSE 86 171*  BUN 13 10  CREATININE 0.87 0.78  CALCIUM 9.4 9.3   LFT Recent Labs    12/27/20 0453  PROT 7.3  ALBUMIN 3.8  AST 16  ALT 13  ALKPHOS 51  BILITOT 0.3   PT/INR No results for input(s): LABPROT, INR in the last 72 hours. Hepatitis Panel No results for input(s): HEPBSAG, HCVAB, HEPAIGM, HEPBIGM in the last 72 hours. C-Diff No results for input(s): CDIFFTOX in the last 72 hours. Fecal Lactopherrin No results for input(s): FECLLACTOFRN in the last 72 hours.  Studies/Results: CT ABDOMEN PELVIS W CONTRAST  Result Date: 12/26/2020 CLINICAL DATA:  Crohn's disease. Vomiting. Question obstruction or abscess. Generalized abdominal pain. EXAM: CT ABDOMEN AND PELVIS WITH CONTRAST TECHNIQUE: Multidetector CT imaging of the abdomen and pelvis was performed using the standard protocol following bolus administration of intravenous contrast. CONTRAST:  74mL OMNIPAQUE IOHEXOL 350 MG/ML SOLN COMPARISON:  08/12/2020 FINDINGS: Lower chest: Lung bases are clear.  No pleural or pericardial fluid.  Hepatobiliary: Normal Pancreas: Normal Spleen: Normal Adrenals/Urinary Tract: Adrenal glands are normal. Kidneys are normal. Bladder is normal. Stomach/Bowel: The stomach and proximal small intestine are normal. As seen previously, there is wall thickening of the terminal ileum and right colon as far as the mid ascending colon. Surrounding edema affects the mesentery. There is fecalization material within distended distal ileum, probably secondary to functional obstruction at the ileocecal valve region related to swelling. There is no evidence of perforation or abscess. Vascular/Lymphatic: Normal except for a few small reactive right lower quadrant mesentery nodes. Reproductive: Normal Other: Small amount of free fluid in the pelvic cul-de-sac. No evidence of loculated collection or abscess. Musculoskeletal: Lower lumbar facet osteoarthritis. No acute finding IMPRESSION: Inflammatory wall thickening of the terminal ileum and right colon consistent with Crohn's enteritis. Relative obstruction at the ileocecal valve region due to swelling with the distal terminal ileum being dilated and full of fecalized material. Electronically Signed   By: Paulina Fusi M.D.   On: 12/26/2020 14:08    Medications: Scheduled:  enoxaparin (LOVENOX) injection  40 mg Subcutaneous Q24H   methylPREDNISolone (SOLU-MEDROL) injection  40 mg Intravenous Q12H   Continuous:  dextrose 5 % and 0.9 % NaCl with KCl 20 mEq/L 100 mL/hr at 12/27/20 0737    Assessment/Plan: 1) Crohn's disease with stricturing. 2) Nausea.   Clinically she is stable.  Being NPO she does not have any vomiting, but nausea is being controlled with medications.  Her periumbilical pain is intermittent and she was requesting stronger pain medications  when the pain is more intense and lesser potency pain medications when the pain is milder.  Her TI stricture, after speaking with Radiology, shows that it has not changed since the March examination.  It is 2 cm in  length, but previously there was no evidence of a mechanical obstruction.  The current scan shows fecalization of stool for 10 cm proximal to this region, but it does not appear that the stricture is more stenosed.  Plan: 1) Continue with Solumedrol. 2) ? Trial of clear liquids tomorrow. 3) Restart morphine for the more intense pain. 4) If there is no progression over the weekend Surgery will need to evaluate the patient. 5) [facility] will follow the patient this weekend.  LOS: 1 day   Hazelle Woollard D 12/27/2020, 9:55 AM

## 2020-12-28 DIAGNOSIS — R1031 Right lower quadrant pain: Secondary | ICD-10-CM

## 2020-12-28 DIAGNOSIS — D509 Iron deficiency anemia, unspecified: Secondary | ICD-10-CM

## 2020-12-28 DIAGNOSIS — K50019 Crohn's disease of small intestine with unspecified complications: Secondary | ICD-10-CM

## 2020-12-28 MED ORDER — SODIUM CHLORIDE 0.9 % IV SOLN
500.0000 mg | Freq: Every day | INTRAVENOUS | Status: AC
  Administered 2020-12-28 (×2): 500 mg via INTRAVENOUS
  Filled 2020-12-28 (×2): qty 10

## 2020-12-28 MED ORDER — SODIUM CHLORIDE 0.9 % IV SOLN
25.0000 mg | Freq: Once | INTRAVENOUS | Status: AC
  Administered 2020-12-28: 25 mg via INTRAVENOUS
  Filled 2020-12-28: qty 0.5

## 2020-12-28 MED ORDER — NITROGLYCERIN 0.4 MG SL SUBL
SUBLINGUAL_TABLET | SUBLINGUAL | Status: AC
  Filled 2020-12-28: qty 1

## 2020-12-28 MED ORDER — MORPHINE SULFATE (PF) 2 MG/ML IV SOLN
2.0000 mg | INTRAVENOUS | Status: DC | PRN
  Administered 2020-12-29 (×4): 2 mg via INTRAVENOUS
  Filled 2020-12-28 (×5): qty 1

## 2020-12-28 NOTE — Progress Notes (Addendum)
Patient ID: Morgan Chung, female   DOB: [birth year], 35 y.o.   MRN: [id number]    Progress Note-covering for Dr. Elnoria Howard   Subjective   Day # 3  CC; Crohn's ileitis/bowel obstruction  IV Solu-Medrol  Patient has been up walking in the halls, says she is feeling a little bit better, no further nausea or vomiting.  She was just started on liquids, has not tried any as yet.  Not passing any flatus and has not had a bowel movement for about 7 days.  Ferritin 10/iron 21/TIBC 385 sat 5 Labs yesterday WBC 8.5 hemoglobin 9.7/hematocrit 33.9 LFTs within normal limits   Objective   Vital signs in last 24 hours: Temp:  [98.3 F (36.8 C)-98.9 F (37.2 C)] 98.3 F (36.8 C) (08/13 0529) Pulse Rate:  [59-71] 71 (08/13 0529) Resp:  [18-20] 18 (08/13 0529) BP: (121-135)/(76-92) 127/76 (08/13 0529) SpO2:  [98 %-100 %] 99 % (08/13 0529) Last BM Date: 12/23/20 General:   African-American female in NAD Heart:  Regular rate and rhythm; no murmurs Lungs: Respirations even and unlabored, lungs CTA bilaterally Abdomen:  Soft, somewhat distended, mild tenderness across the lower abdomen no rebound bowel sounds present but quiet  Extremities:  Without edema. Neurologic:  Alert and oriented,  grossly normal neurologically. Psych:  Cooperative. Normal mood and affect.  Intake/Output from previous day: 08/12 0701 - 08/13 0700 In: 2548.3 [I.V.:2548.3] Out: 700 [Urine:700] Intake/Output this shift: Total I/O In: 60 [P.O.:60] Out: 0   Lab Results: Recent Labs    12/26/20 1059 12/27/20 0453  WBC 5.7 8.5  HGB 10.6* 9.7*  HCT 35.5* 33.9*  PLT 277 269   BMET Recent Labs    12/26/20 1059 12/27/20 0453  NA 139 135  K 3.7 4.1  CL 107 104  CO2 26 24  GLUCOSE 86 171*  BUN 13 10  CREATININE 0.87 0.78  CALCIUM 9.4 9.3   LFT Recent Labs    12/27/20 0453  PROT 7.3  ALBUMIN 3.8  AST 16  ALT 13  ALKPHOS 51  BILITOT 0.3   PT/INR No results for input(s): LABPROT, INR in the last 72  hours.  Studies/Results: CT ABDOMEN PELVIS W CONTRAST  Result Date: 12/26/2020 CLINICAL DATA:  Crohn's disease. Vomiting. Question obstruction or abscess. Generalized abdominal pain. EXAM: CT ABDOMEN AND PELVIS WITH CONTRAST TECHNIQUE: Multidetector CT imaging of the abdomen and pelvis was performed using the standard protocol following bolus administration of intravenous contrast. CONTRAST:  67mL OMNIPAQUE IOHEXOL 350 MG/ML SOLN COMPARISON:  08/12/2020 FINDINGS: Lower chest: Lung bases are clear.  No pleural or pericardial fluid. Hepatobiliary: Normal Pancreas: Normal Spleen: Normal Adrenals/Urinary Tract: Adrenal glands are normal. Kidneys are normal. Bladder is normal. Stomach/Bowel: The stomach and proximal small intestine are normal. As seen previously, there is wall thickening of the terminal ileum and right colon as far as the mid ascending colon. Surrounding edema affects the mesentery. There is fecalization material within distended distal ileum, probably secondary to functional obstruction at the ileocecal valve region related to swelling. There is no evidence of perforation or abscess. Vascular/Lymphatic: Normal except for a few small reactive right lower quadrant mesentery nodes. Reproductive: Normal Other: Small amount of free fluid in the pelvic cul-de-sac. No evidence of loculated collection or abscess. Musculoskeletal: Lower lumbar facet osteoarthritis. No acute finding IMPRESSION: Inflammatory wall thickening of the terminal ileum and right colon consistent with Crohn's enteritis. Relative obstruction at the ileocecal valve region due to swelling with the distal terminal ileum being dilated and  full of fecalized material. Electronically Signed   By: Paulina Fusi M.D.   On: 12/26/2020 14:08       Assessment / Plan:    #49 35 year old African-American female with diagnosis of Crohn's ileitis made March 2022 when she was hospitalized here. She had colonoscopy March 2022 with finding of  stricture in the terminal ileum measured about 6 mm with deformed ileocecal valve.  Stricture felt to be fibrotic, biopsies consistent with Crohn's disease. She was to start Remicade and azathioprine but decided to seek second opinion at [facility].  She says she had initial consultation and is now scheduled for colonoscopy and EGD in October.  In the interim she has not had any treatment. Now presenting with recurrent obstructive symptoms, and CT scan showing thickened terminal ileum with stricturing,, and inflammatory changes in the right colon consistent with Crohn's enteritis.  She is not having any vomiting IV Solu-Medrol has been started. Starting clear liquids today  #1 Severe iron deficiency/anemia-secondary to Crohn's disease.  Plan was for consideration of surgical consultation if she does not have any progression this weekend. Continue current regimen I think she would benefit from IV iron infusion during this admission   Principal Problem:   Crohn's disease of small intestine with complication (HCC) Active Problems:   Abdominal pain   Insomnia   Microcytic anemia   Nausea and vomiting   Bowel obstruction (HCC)     LOS: 2 days   Amy Esterwood PA-C 12/28/2020, 10:33 AM  GI ATTENDING  Interval history data reviewed.  Patient seen and examined.  Agree with interval progress note as outlined above.  Continue with current measures.  GI will not see the patient tomorrow (but are available if needed).  Dr. Elnoria Howard will be back Monday to resume GI care.  Wilhemina Bonito. Eda Keys., M.D. [facility] Division of Gastroenterology

## 2020-12-28 NOTE — Progress Notes (Signed)
PROGRESS NOTE    Morgan Chung  ERD:[id number] DOB: January 26, 1986 DOA: 12/26/2020 PCP: [name], MD     Brief Narrative:  Morgan Chung is a 35 year old with a history of Crohn's ileitis diagnosed March 2022 and previously treated with a short course of steroid who presented to the ER with complaints of severe mid abdominal pain associated with intractable nausea and vomiting progressively worsening for a total of 2 weeks.  This is been associated with very small irregular bowel movements but no melena or hematochezia.  CT of the abdomen and pelvis revealed terminal ileitis and a relative obstruction at the level of the ileocecal valve.  GI was consulted and suggested initiation of Solu-Medrol.  New events last 24 hours / Subjective: Pain improved, requiring pain medications less frequently.  Nausea improved as well.  Wants to advance her diet to clears.  Feeling very hungry.  Assessment & Plan:   Principal Problem:   Crohn's disease of small intestine with complication (HCC) Active Problems:   Abdominal pain   Insomnia   Microcytic anemia   Nausea and vomiting   Bowel obstruction (HCC)   Crohn's flare with terminal ileum stricture -GI following -Continue Solu-Medrol  Iron deficiency anemia -Hemoglobin remains stable -Will need iron supplementation  DVT prophylaxis:  enoxaparin (LOVENOX) injection 40 mg Start: 12/26/20 2200  Code Status:     Code Status Orders  (From admission, onward)           Start     Ordered   12/26/20 1916  Full code  Continuous        12/26/20 1916           Code Status History     Date Active Date Inactive Code Status Order ID Comments User Context   08/07/2020 1348 08/14/2020 2128 Full Code [id number]  Jae Dire, MD ED      Family Communication: No family at bedside Disposition Plan:  Status is: Inpatient  Remains inpatient appropriate because:IV treatments appropriate due to intensity of illness or inability to take  PO  Dispo: The patient is from: Home              Anticipated d/c is to: Home              Patient currently is not medically stable to d/c.   Difficult to place patient No      Consultants:  GI   Antimicrobials:  Anti-infectives (From admission, onward)    None        Objective: Vitals:   12/27/20 1330 12/27/20 1744 12/27/20 2130 12/28/20 0529  BP: 121/78 133/76 (!) 135/92 127/76  Pulse: (!) 59 67 71 71  Resp: 18 20 18 18   Temp: 98.3 F (36.8 C) 98.9 F (37.2 C) 98.9 F (37.2 C) 98.3 F (36.8 C)  TempSrc: Oral  Oral Oral  SpO2: 100% 100% 98% 99%  Weight:      Height:        Intake/Output Summary (Last 24 hours) at 12/28/2020 1042 Last data filed at 12/28/2020 1000 Gross per 24 hour  Intake 2608.25 ml  Output 200 ml  Net 2408.25 ml    Filed Weights   12/26/20 0957  Weight: 83 kg   Examination: General exam: Appears calm and comfortable  Respiratory system: Clear to auscultation. Respiratory effort normal. Cardiovascular system: S1 & S2 heard, RRR. No pedal edema. Gastrointestinal system: Abdomen is nondistended, soft and nontender. Normal bowel sounds heard. Central nervous system: Alert and oriented. Non  focal exam. Speech clear  Extremities: Symmetric in appearance bilaterally  Skin: No rashes, lesions or ulcers on exposed skin  Psychiatry: Judgement and insight appear stable. Mood & affect appropriate.     Data Reviewed: I have personally reviewed following labs and imaging studies  CBC: Recent Labs  Lab 12/26/20 1059 12/27/20 0453  WBC 5.7 8.5  NEUTROABS 4.3  --   HGB 10.6* 9.7*  HCT 35.5* 33.9*  MCV 75.9* 76.0*  PLT 277 269    Basic Metabolic Panel: Recent Labs  Lab 12/26/20 1059 12/27/20 0453  NA 139 135  K 3.7 4.1  CL 107 104  CO2 26 24  GLUCOSE 86 171*  BUN 13 10  CREATININE 0.87 0.78  CALCIUM 9.4 9.3  MG  --  2.1  PHOS  --  2.7    GFR: Estimated Creatinine Clearance: 105.4 mL/min (by C-G formula based on SCr of  0.78 mg/dL). Liver Function Tests: Recent Labs  Lab 12/26/20 1059 12/27/20 0453  AST 20 16  ALT 14 13  ALKPHOS 54 51  BILITOT 0.3 0.3  PROT 7.8 7.3  ALBUMIN 4.1 3.8    Recent Labs  Lab 12/26/20 1059  LIPASE 26    No results for input(s): AMMONIA in the last 168 hours. Coagulation Profile: No results for input(s): INR, PROTIME in the last 168 hours. Cardiac Enzymes: No results for input(s): CKTOTAL, CKMB, CKMBINDEX, TROPONINI in the last 168 hours. BNP (last 3 results) No results for input(s): PROBNP in the last 8760 hours. HbA1C: No results for input(s): HGBA1C in the last 72 hours. CBG: Recent Labs  Lab 12/27/20 0930  GLUCAP 150*    Lipid Profile: No results for input(s): CHOL, HDL, LDLCALC, TRIG, CHOLHDL, LDLDIRECT in the last 72 hours. Thyroid Function Tests: No results for input(s): TSH, T4TOTAL, FREET4, T3FREE, THYROIDAB in the last 72 hours. Anemia Panel: Recent Labs    12/27/20 0453  VITAMINB12 489  FOLATE 9.1  FERRITIN 10*  TIBC 385  IRON 21*  RETICCTPCT 1.7    Sepsis Labs: No results for input(s): PROCALCITON, LATICACIDVEN in the last 168 hours.  Recent Results (from the past 240 hour(s))  Resp Panel by RT-PCR (Flu A&B, Covid) Nasopharyngeal Swab     Status: None   Collection Time: 12/26/20  8:28 PM   Specimen: Nasopharyngeal Swab; Nasopharyngeal(NP) swabs in vial transport medium  Result Value Ref Range Status   SARS Coronavirus 2 by RT PCR NEGATIVE NEGATIVE Final    Comment: (NOTE) SARS-CoV-2 target nucleic acids are NOT DETECTED.  The SARS-CoV-2 RNA is generally detectable in upper respiratory specimens during the acute phase of infection. The lowest concentration of SARS-CoV-2 viral copies this assay can detect is 138 copies/mL. A negative result does not preclude SARS-Cov-2 infection and should not be used as the sole basis for treatment or other patient management decisions. A negative result may occur with  improper specimen  collection/handling, submission of specimen other than nasopharyngeal swab, presence of viral mutation(s) within the areas targeted by this assay, and inadequate number of viral copies(<138 copies/mL). A negative result must be combined with clinical observations, patient history, and epidemiological information. The expected result is Negative.  Fact Sheet for Patients:  BloggerCourse.com  Fact Sheet for Healthcare Providers:  SeriousBroker.it  This test is no t yet approved or cleared by the [place] FDA and  has been authorized for detection and/or diagnosis of SARS-CoV-2 by FDA under an Emergency Use Authorization (EUA). This EUA will remain  in effect (  meaning this test can be used) for the duration of the COVID-19 declaration under Section 564(b)(1) of the Act, 21 U.S.C.section 360bbb-3(b)(1), unless the authorization is terminated  or revoked sooner.       Influenza A by PCR NEGATIVE NEGATIVE Final   Influenza B by PCR NEGATIVE NEGATIVE Final    Comment: (NOTE) The Xpert Xpress SARS-CoV-2/FLU/RSV plus assay is intended as an aid in the diagnosis of influenza from Nasopharyngeal swab specimens and should not be used as a sole basis for treatment. Nasal washings and aspirates are unacceptable for Xpert Xpress SARS-CoV-2/FLU/RSV testing.  Fact Sheet for Patients: BloggerCourse.com  Fact Sheet for Healthcare Providers: SeriousBroker.it  This test is not yet approved or cleared by the [place] FDA and has been authorized for detection and/or diagnosis of SARS-CoV-2 by FDA under an Emergency Use Authorization (EUA). This EUA will remain in effect (meaning this test can be used) for the duration of the COVID-19 declaration under Section 564(b)(1) of the Act, 21 U.S.C. section 360bbb-3(b)(1), unless the authorization is terminated or revoked.  Performed at [facility], [address], [place], [place] [address]        Radiology Studies: CT ABDOMEN PELVIS W CONTRAST  Result Date: 12/26/2020 CLINICAL DATA:  Crohn's disease. Vomiting. Question obstruction or abscess. Generalized abdominal pain. EXAM: CT ABDOMEN AND PELVIS WITH CONTRAST TECHNIQUE: Multidetector CT imaging of the abdomen and pelvis was performed using the standard protocol following bolus administration of intravenous contrast. CONTRAST:  69mL OMNIPAQUE IOHEXOL 350 MG/ML SOLN COMPARISON:  08/12/2020 FINDINGS: Lower chest: Lung bases are clear.  No pleural or pericardial fluid. Hepatobiliary: Normal Pancreas: Normal Spleen: Normal Adrenals/Urinary Tract: Adrenal glands are normal. Kidneys are normal. Bladder is normal. Stomach/Bowel: The stomach and proximal small intestine are normal. As seen previously, there is wall thickening of the terminal ileum and right colon as far as the mid ascending colon. Surrounding edema affects the mesentery. There is fecalization material within distended distal ileum, probably secondary to functional obstruction at the ileocecal valve region related to swelling. There is no evidence of perforation or abscess. Vascular/Lymphatic: Normal except for a few small reactive right lower quadrant mesentery nodes. Reproductive: Normal Other: Small amount of free fluid in the pelvic cul-de-sac. No evidence of loculated collection or abscess. Musculoskeletal: Lower lumbar facet osteoarthritis. No acute finding IMPRESSION: Inflammatory wall thickening of the terminal ileum and right colon consistent with Crohn's enteritis. Relative obstruction at the ileocecal valve region due to swelling with the distal terminal ileum being dilated and full of fecalized material. Electronically Signed   By: Paulina Fusi M.D.   On: 12/26/2020 14:08      Scheduled Meds:  (feeding supplement) PROSource Plus  30 mL Oral TID BM   enoxaparin (LOVENOX) injection  40 mg  Subcutaneous Q24H   feeding supplement  1 Container Oral TID BM   methylPREDNISolone (SOLU-MEDROL) injection  40 mg Intravenous Q12H   multivitamin with minerals  1 tablet Oral Daily   Continuous Infusions:  sodium chloride 100 mL/hr at 12/28/20 0428     LOS: 2 days      Time spent: 30 minutes   Noralee Stain, DO [facility] 12/28/2020, 10:42 AM   Available via Epic secure chat 7am-7pm After these hours, please refer to coverage provider listed on [email or web address]

## 2020-12-29 ENCOUNTER — Encounter (HOSPITAL_COMMUNITY): Payer: Self-pay | Admitting: Internal Medicine

## 2020-12-29 ENCOUNTER — Inpatient Hospital Stay (HOSPITAL_COMMUNITY): Payer: Medicaid Other

## 2020-12-29 LAB — CBC WITH DIFFERENTIAL/PLATELET
Abs Immature Granulocytes: 0.11 10*3/uL — ABNORMAL HIGH (ref 0.00–0.07)
Basophils Absolute: 0 10*3/uL (ref 0.0–0.1)
Basophils Relative: 0 %
Eosinophils Absolute: 0 10*3/uL (ref 0.0–0.5)
Eosinophils Relative: 0 %
HCT: 31.9 % — ABNORMAL LOW (ref 36.0–46.0)
Hemoglobin: 9.4 g/dL — ABNORMAL LOW (ref 12.0–15.0)
Immature Granulocytes: 1 %
Lymphocytes Relative: 6 %
Lymphs Abs: 0.8 10*3/uL (ref 0.7–4.0)
MCH: 22.7 pg — ABNORMAL LOW (ref 26.0–34.0)
MCHC: 29.5 g/dL — ABNORMAL LOW (ref 30.0–36.0)
MCV: 77.1 fL — ABNORMAL LOW (ref 80.0–100.0)
Monocytes Absolute: 0.1 10*3/uL (ref 0.1–1.0)
Monocytes Relative: 1 %
Neutro Abs: 10.9 10*3/uL — ABNORMAL HIGH (ref 1.7–7.7)
Neutrophils Relative %: 92 %
Platelets: 268 10*3/uL (ref 150–400)
RBC: 4.14 MIL/uL (ref 3.87–5.11)
RDW: 16.9 % — ABNORMAL HIGH (ref 11.5–15.5)
WBC: 11.9 10*3/uL — ABNORMAL HIGH (ref 4.0–10.5)
nRBC: 0 % (ref 0.0–0.2)

## 2020-12-29 LAB — COMPREHENSIVE METABOLIC PANEL
ALT: 34 U/L (ref 0–44)
AST: 33 U/L (ref 15–41)
Albumin: 3.8 g/dL (ref 3.5–5.0)
Alkaline Phosphatase: 49 U/L (ref 38–126)
Anion gap: 6 (ref 5–15)
BUN: 15 mg/dL (ref 6–20)
CO2: 24 mmol/L (ref 22–32)
Calcium: 9.3 mg/dL (ref 8.9–10.3)
Chloride: 110 mmol/L (ref 98–111)
Creatinine, Ser: 0.83 mg/dL (ref 0.44–1.00)
GFR, Estimated: >60 mL/min (ref >60)
Glucose, Bld: 110 mg/dL — ABNORMAL HIGH (ref 70–99)
Potassium: 3.9 mmol/L (ref 3.5–5.1)
Sodium: 140 mmol/L (ref 135–145)
Total Bilirubin: 0.7 mg/dL (ref 0.3–1.2)
Total Protein: 7.3 g/dL (ref 6.5–8.1)

## 2020-12-29 LAB — TROPONIN I (HIGH SENSITIVITY)
Troponin I (High Sensitivity): 3 ng/L (ref <18)
Troponin I (High Sensitivity): 2 ng/L (ref <18)

## 2020-12-29 MED ORDER — HYDRALAZINE HCL 20 MG/ML IJ SOLN
5.0000 mg | Freq: Once | INTRAMUSCULAR | Status: AC
  Administered 2020-12-29: 5 mg via INTRAVENOUS
  Filled 2020-12-29: qty 1

## 2020-12-29 MED ORDER — HYDRALAZINE HCL 20 MG/ML IJ SOLN: 10.0000 mg | Freq: Four times a day (QID) | INTRAMUSCULAR | Status: DC | PRN

## 2020-12-29 MED ORDER — MORPHINE SULFATE (PF) 2 MG/ML IV SOLN
1.0000 mg | Freq: Once | INTRAVENOUS | Status: AC
Start: 2020-12-29 — End: 2020-12-29
  Administered 2020-12-29: 1 mg via INTRAVENOUS

## 2020-12-29 MED ORDER — IOHEXOL 350 MG/ML SOLN
80.0000 mL | Freq: Once | INTRAVENOUS | Status: AC | PRN
  Administered 2020-12-29: 80 mL via INTRAVENOUS

## 2020-12-29 MED ORDER — HYDRALAZINE HCL 20 MG/ML IJ SOLN
5.0000 mg | Freq: Four times a day (QID) | INTRAMUSCULAR | Status: DC | PRN
  Administered 2020-12-29: 5 mg via INTRAVENOUS
  Filled 2020-12-29: qty 1

## 2020-12-29 MED ORDER — SODIUM CHLORIDE 0.9 % IV SOLN: 500.0000 mg | Freq: Every day | INTRAVENOUS | Status: DC

## 2020-12-29 MED ORDER — SIMETHICONE 80 MG PO CHEW
80.0000 mg | CHEWABLE_TABLET | Freq: Once | ORAL | Status: AC
  Administered 2020-12-29: 80 mg via ORAL
  Filled 2020-12-29: qty 1

## 2020-12-29 NOTE — Progress Notes (Signed)
Pt was c/o tightness, pressure of chest, chest pain rated 5/10, and back pain; VS checked: temp of 98.2, 186/121, HR 47, at 100% RA; Rapid response called for assessment; Ms. Bruna Potter, NP at bedside for evaluation with orders carried out. Will continue to monitor pt.

## 2020-12-29 NOTE — Progress Notes (Signed)
Rapid Response Event Note   Reason for Call : pt c/o Chest Pain 5/10   Initial Focused Assessment: pt A/O c/o some chest tightness 5/10.  However, c/o abd pain 8/10.  Also c/o back pain.  TRH1, NP at bedside as well to evaluate pt. Pt able to follow all commands.      Interventions:   EKG complete, TRH1, NP ordered labs.  Pt given 1 mg MSO4 iv per NP.  RN to give full dose, if pt tolerate the 1 mg iv.   Plan of Care: Primary, RN continue monitoring and report. Pt will stay in current location.    Event Summary:   MD Notified: yes Call Time: 2336 Arrival Time: 2340 End Time: 0100  Conley Rolls, RN

## 2020-12-29 NOTE — Progress Notes (Addendum)
PROGRESS NOTE    Morgan Chung  RJJ:[id number] DOB: Aug 24, 1985 DOA: 12/26/2020 PCP: [name], MD     Brief Narrative:  Morgan Chung is a 35 year old with a history of Crohn's ileitis diagnosed March 2022 and previously treated with a short course of steroid who presented to the ER with complaints of severe mid abdominal pain associated with intractable nausea and vomiting progressively worsening for a total of 2 weeks.  This is been associated with very small irregular bowel movements but no melena or hematochezia.  CT of the abdomen and pelvis revealed terminal ileitis and a relative obstruction at the level of the ileocecal valve.  GI was consulted and suggested initiation of Solu-Medrol.  New events last 24 hours / Subjective: States she was doing well yesterday, ambulating and tolerating clear liquids.  Then around 10 PM, she started to have left-sided chest pain that radiated between her shoulder blades.  This lasted until this morning.  Now she is having bilateral lower abdominal pain that radiates to bilateral flank.  Pain is not resolved with morphine.  She is also having elevated blood pressure up to 184/102.   Addendum: CTA C/A/P without vascular etiology of her chest pain. Patient no longer having CP now. Wonder if her CP was reaction from IV iron infusion yesterday. Still having some lower abdominal pain with radiation to her flanks. She's concerned about her continued lack of progress, wondering if she should get colonoscopy and further work up before starting biologic treatment before being discharged from hospital. GI should be back to round tomorrow. Will leave on clear liquid diet until cleared by GI to advance.   Assessment & Plan:   Principal Problem:   Crohn's disease of small intestine with complication (HCC) Active Problems:   Abdominal pain   Insomnia   Microcytic anemia   Nausea and vomiting   Bowel obstruction (HCC)   Crohn's flare with terminal ileum stricture -GI  following -Continue Solu-Medrol  Iron deficiency anemia -Hemoglobin remains stable -IV iron  Chest pain, hypertensive urgency, abdominal pain -Troponin has been negative -EKG was obtained overnight which is unavailable for me to review on epic, copy is not in the shadow chart but was reportedly sinus bradycardia without ST change -Chest pain has resolved this morning -CTA chest abdomen pelvis ordered  DVT prophylaxis:  enoxaparin (LOVENOX) injection 40 mg Start: 12/26/20 2200  Code Status:     Code Status Orders  (From admission, onward)           Start     Ordered   12/26/20 1916  Full code  Continuous        12/26/20 1916           Code Status History     Date Active Date Inactive Code Status Order ID Comments User Context   08/07/2020 1348 08/14/2020 2128 Full Code [id number]  Jae Dire, MD ED      Family Communication: At bedside this afternoon  Disposition Plan:  Status is: Inpatient  Remains inpatient appropriate because:IV treatments appropriate due to intensity of illness or inability to take PO  Dispo: The patient is from: Home              Anticipated d/c is to: Home              Patient currently is not medically stable to d/c.   Difficult to place patient No      Consultants:  GI   Antimicrobials:  Anti-infectives (From admission,  onward)    None        Objective: Vitals:   12/29/20 0452 12/29/20 0537 12/29/20 0758 12/29/20 0908  BP: (!) 170/101 (!) 173/98 (!) 184/102 (!) 143/86  Pulse: (!) 48 (!) 53  (!) 52  Resp: 18 (!) 8  16  Temp: 98.6 F (37 C) 98.8 F (37.1 C)  98.2 F (36.8 C)  TempSrc: Oral Oral  Oral  SpO2: 100% 100%  100%  Weight:      Height:        Intake/Output Summary (Last 24 hours) at 12/29/2020 1134 Last data filed at 12/29/2020 1000 Gross per 24 hour  Intake 3338.68 ml  Output 3200 ml  Net 138.68 ml    Filed Weights   12/26/20 0957  Weight: 83 kg   Examination: General exam: Appears calm  not in any distress Respiratory system: Clear to auscultation. Respiratory effort normal. Cardiovascular system: S1 & S2 heard, bradycardic rate, regular rhythm. No pedal edema. Gastrointestinal system: Abdomen is nondistended, soft and mildly tender to palpation bilateral lower quadrants, bowel sounds heard Central nervous system: Alert and oriented. Non focal exam. Speech clear  Extremities: Symmetric in appearance bilaterally  Skin: No rashes, lesions or ulcers on exposed skin  Psychiatry: Judgement and insight appear stable. Mood & affect appropriate.     Data Reviewed: I have personally reviewed following labs and imaging studies  CBC: Recent Labs  Lab 12/26/20 1059 12/27/20 0453 12/29/20 0017  WBC 5.7 8.5 11.9*  NEUTROABS 4.3  --  10.9*  HGB 10.6* 9.7* 9.4*  HCT 35.5* 33.9* 31.9*  MCV 75.9* 76.0* 77.1*  PLT 277 269 268    Basic Metabolic Panel: Recent Labs  Lab 12/26/20 1059 12/27/20 0453 12/29/20 0017  NA 139 135 140  K 3.7 4.1 3.9  CL 107 104 110  CO2 26 24 24   GLUCOSE 86 171* 110*  BUN 13 10 15   CREATININE 0.87 0.78 0.83  CALCIUM 9.4 9.3 9.3  MG  --  2.1  --   PHOS  --  2.7  --     GFR: Estimated Creatinine Clearance: 101.6 mL/min (by C-G formula based on SCr of 0.83 mg/dL). Liver Function Tests: Recent Labs  Lab 12/26/20 1059 12/27/20 0453 12/29/20 0017  AST 20 16 33  ALT 14 13 34  ALKPHOS 54 51 49  BILITOT 0.3 0.3 0.7  PROT 7.8 7.3 7.3  ALBUMIN 4.1 3.8 3.8    Recent Labs  Lab 12/26/20 1059  LIPASE 26    No results for input(s): AMMONIA in the last 168 hours. Coagulation Profile: No results for input(s): INR, PROTIME in the last 168 hours. Cardiac Enzymes: No results for input(s): CKTOTAL, CKMB, CKMBINDEX, TROPONINI in the last 168 hours. BNP (last 3 results) No results for input(s): PROBNP in the last 8760 hours. HbA1C: No results for input(s): HGBA1C in the last 72 hours. CBG: Recent Labs  Lab 12/27/20 0930  GLUCAP 150*     Lipid Profile: No results for input(s): CHOL, HDL, LDLCALC, TRIG, CHOLHDL, LDLDIRECT in the last 72 hours. Thyroid Function Tests: No results for input(s): TSH, T4TOTAL, FREET4, T3FREE, THYROIDAB in the last 72 hours. Anemia Panel: Recent Labs    12/27/20 0453  VITAMINB12 489  FOLATE 9.1  FERRITIN 10*  TIBC 385  IRON 21*  RETICCTPCT 1.7    Sepsis Labs: No results for input(s): PROCALCITON, LATICACIDVEN in the last 168 hours.  Recent Results (from the past 240 hour(s))  Resp Panel by RT-PCR (Flu  A&B, Covid) Nasopharyngeal Swab     Status: None   Collection Time: 12/26/20  8:28 PM   Specimen: Nasopharyngeal Swab; Nasopharyngeal(NP) swabs in vial transport medium  Result Value Ref Range Status   SARS Coronavirus 2 by RT PCR NEGATIVE NEGATIVE Final    Comment: (NOTE) SARS-CoV-2 target nucleic acids are NOT DETECTED.  The SARS-CoV-2 RNA is generally detectable in upper respiratory specimens during the acute phase of infection. The lowest concentration of SARS-CoV-2 viral copies this assay can detect is 138 copies/mL. A negative result does not preclude SARS-Cov-2 infection and should not be used as the sole basis for treatment or other patient management decisions. A negative result may occur with  improper specimen collection/handling, submission of specimen other than nasopharyngeal swab, presence of viral mutation(s) within the areas targeted by this assay, and inadequate number of viral copies(<138 copies/mL). A negative result must be combined with clinical observations, patient history, and epidemiological information. The expected result is Negative.  Fact Sheet for Patients:  BloggerCourse.com  Fact Sheet for Healthcare Providers:  SeriousBroker.it  This test is no t yet approved or cleared by the [place] FDA and  has been authorized for detection and/or diagnosis of SARS-CoV-2 by FDA under an Emergency  Use Authorization (EUA). This EUA will remain  in effect (meaning this test can be used) for the duration of the COVID-19 declaration under Section 564(b)(1) of the Act, 21 U.S.C.section 360bbb-3(b)(1), unless the authorization is terminated  or revoked sooner.       Influenza A by PCR NEGATIVE NEGATIVE Final   Influenza B by PCR NEGATIVE NEGATIVE Final    Comment: (NOTE) The Xpert Xpress SARS-CoV-2/FLU/RSV plus assay is intended as an aid in the diagnosis of influenza from Nasopharyngeal swab specimens and should not be used as a sole basis for treatment. Nasal washings and aspirates are unacceptable for Xpert Xpress SARS-CoV-2/FLU/RSV testing.  Fact Sheet for Patients: BloggerCourse.com  Fact Sheet for Healthcare Providers: SeriousBroker.it  This test is not yet approved or cleared by the [place] FDA and has been authorized for detection and/or diagnosis of SARS-CoV-2 by FDA under an Emergency Use Authorization (EUA). This EUA will remain in effect (meaning this test can be used) for the duration of the COVID-19 declaration under Section 564(b)(1) of the Act, 21 U.S.C. section 360bbb-3(b)(1), unless the authorization is terminated or revoked.  Performed at [facility], [address] [place], [place] [address]        Radiology Studies: No results found.    Scheduled Meds:  (feeding supplement) PROSource Plus  30 mL Oral TID BM   enoxaparin (LOVENOX) injection  40 mg Subcutaneous Q24H   feeding supplement  1 Container Oral TID BM   methylPREDNISolone (SOLU-MEDROL) injection  40 mg Intravenous Q12H   multivitamin with minerals  1 tablet Oral Daily   Continuous Infusions:  sodium chloride 50 mL/hr at 12/28/20 2358     LOS: 3 days      Time spent: 30 minutes   Noralee Stain, DO [facility] 12/29/2020, 11:34 AM   Available via Epic secure chat 7am-7pm After these hours,  please refer to coverage provider listed on [email or web address]

## 2020-12-30 ENCOUNTER — Encounter (HOSPITAL_COMMUNITY): Payer: Self-pay | Admitting: Internal Medicine

## 2020-12-30 LAB — CBC
HCT: 31 % — ABNORMAL LOW (ref 36.0–46.0)
Hemoglobin: 9.1 g/dL — ABNORMAL LOW (ref 12.0–15.0)
MCH: 22.2 pg — ABNORMAL LOW (ref 26.0–34.0)
MCHC: 29.4 g/dL — ABNORMAL LOW (ref 30.0–36.0)
MCV: 75.8 fL — ABNORMAL LOW (ref 80.0–100.0)
Platelets: 259 10*3/uL (ref 150–400)
RBC: 4.09 MIL/uL (ref 3.87–5.11)
RDW: 17.1 % — ABNORMAL HIGH (ref 11.5–15.5)
WBC: 10.7 10*3/uL — ABNORMAL HIGH (ref 4.0–10.5)
nRBC: 0 % (ref 0.0–0.2)

## 2020-12-30 LAB — BASIC METABOLIC PANEL
Anion gap: 5 (ref 5–15)
BUN: 12 mg/dL (ref 6–20)
CO2: 25 mmol/L (ref 22–32)
Calcium: 9.4 mg/dL (ref 8.9–10.3)
Chloride: 106 mmol/L (ref 98–111)
Creatinine, Ser: 0.83 mg/dL (ref 0.44–1.00)
GFR, Estimated: >60 mL/min (ref >60)
Glucose, Bld: 106 mg/dL — ABNORMAL HIGH (ref 70–99)
Potassium: 4.1 mmol/L (ref 3.5–5.1)
Sodium: 136 mmol/L (ref 135–145)

## 2020-12-30 MED ORDER — HYDROCHLOROTHIAZIDE 12.5 MG PO CAPS
12.5000 mg | ORAL_CAPSULE | Freq: Every day | ORAL | Status: DC
  Administered 2020-12-30: 12.5 mg via ORAL
  Filled 2020-12-30: qty 1

## 2020-12-30 NOTE — Progress Notes (Signed)
Attending MD notified by Darl Pikes, RN to call patient because patient was requested to be discharge and pt wanted to talk to Dr. Attending MD never call back and patient was unhappy about the care she was receiving. Patient collect her belongings and went home. Patient refused to sign AMA form. Patient states "I am not trying to leave AMA but I requested discharge since 4pm, and I did not hear anything from MD. If I am not receiving any treatment there is no point to be here." [facility] notified.

## 2020-12-30 NOTE — Consult Note (Signed)
Lieutenant DiegoErin Kelch 1986/02/03  [id number].    Requesting MD: Dr. Charna ElizabethJyothi Mann  Chief Complaint/Reason for Consult: crohn's disease  HPI:  This is a 35 yo female with a history of anemia as well presumed crohn's disease diagnosed in March of this year.  She had been having issues with moving her bowels and abdominal pain.  She underwent a CT scan at that time which revealed circumferential wall thickening of her TI with inflammatory changes and reactive adenopathy most concerning for crohn's disease.  She then underwent a work up with a colonoscopy with biopsies revealing chronic ileitis on 3/22.  It sounds like the patient never felt confident in the diagnosis of crohn's disease and followed up with GI here in town who offered to started her on dual therapy, but the patient failed to due so.  She saw Dr. Octaviano GlowBadreddine, [facility] at [facility], in July who also felt like she had Crohn's disease but she told him she wasn't having any lower abdominal symptoms, just symptoms of early satiety and reflux.  He suggested it was possible that she could have ileitis secondary NSAID use and recommended she stop taking these and to plan for an EGD and repeat colonoscopy with new biopsies in 3-4 more months since she denied any complaints at that time.  The patient presents here this admission due to pain as well as persistent constipation type symptoms where she has to use suppositories or enemas or dulcolax to help move her bowels.  Her originally CT scan this admission shows inflammatory changes of a short segment of bowel c/w crohn's enteritis.  She actually had a follow up CT scan 2 days later after being started on steroids, which shows improvement in the inflammatory changes noted at this area with NO evidence of obstruction.  We have been asked to evaluate this patient.  ROS: ROS: Please see HPI, otherwise all other systems have been reviewed and are negative  Family History  Problem Relation Age of Onset   Hypertension  Mother    Irritable bowel syndrome Mother    Diabetes Father    Hypertension Father    Irritable bowel syndrome Sister    Colon cancer Neg Hx    Pancreatic cancer Neg Hx    Stomach cancer Neg Hx    Esophageal cancer Neg Hx    Liver cancer Neg Hx    Colon polyps Neg Hx     Past Medical History:  Diagnosis Date   Anemia    Asthma    Crohn's disease (HCC)    Gastritis    GERD (gastroesophageal reflux disease)    Insomnia     Past Surgical History:  Procedure Laterality Date   CESAREAN SECTION      Social History:  reports that she has never smoked. She has never used smokeless tobacco. She reports current alcohol use. She reports that she does not use drugs.  Allergies:  Allergies  Allergen Reactions   Banana Itching, Swelling and Other (See Comments)    Mouth itches and swells   Other Itching, Swelling and Other (See Comments)    (Fruit) MELON MIX- Mouth itches and swells   Latex Rash    Medications Prior to Admission  Medication Sig Dispense Refill   CALCIUM PO Take 1 tablet by mouth daily with breakfast.     Cholecalciferol (VITAMIN D-3 PO) Take 1 capsule by mouth daily with breakfast.     ferrous sulfate 325 (65 FE) MG tablet Take 325 mg by  mouth daily with breakfast.     PROAIR HFA 108 (90 Base) MCG/ACT inhaler Inhale 2 puffs into the lungs every 4 (four) hours as needed for wheezing or shortness of breath (ex-induced).     zolpidem (AMBIEN) 10 MG tablet Take 10 mg by mouth at bedtime as needed for sleep.       Physical Exam: Blood pressure (!) 158/102, pulse (!) 49, temperature 98.7 F (37.1 C), temperature source Oral, resp. rate 16, height 5\' 5"  (1.651 m), weight 83 kg, last menstrual period 12/09/2020, SpO2 100 %. General: pleasant, WD, WN female who is laying in bed in NAD HEENT: head is normocephalic, atraumatic.  Sclera are noninjected.  PERRL.  Ears and nose without any masses or lesions.  Mouth is pink and moist Heart: regular, rate, and rhythm.   Normal s1,s2. No obvious murmurs, gallops, or rubs noted.  Palpable radial and pedal pulses bilaterally Lungs: CTAB, no wheezes, rhonchi, or rales noted.  Respiratory effort nonlabored Abd: soft, minimally tender in epigastrium, but no RLQ tenderness, ND, +BS, no masses, hernias, or organomegaly MS: all 4 extremities are symmetrical with no cyanosis, clubbing, or edema. Skin: warm and dry with no masses, lesions, or rashes Neuro: Cranial nerves 2-12 grossly intact, sensation is normal throughout Psych: A&Ox3 with an appropriate affect.   Results for orders placed or performed during the hospital encounter of 12/26/20 (from the past 48 hour(s))  Troponin I (High Sensitivity)     Status: None   Collection Time: 12/29/20 12:17 AM  Result Value Ref Range   Troponin I (High Sensitivity) 3 <18 ng/L    Comment: (NOTE) Elevated high sensitivity troponin I (hsTnI) values and significant  changes across serial measurements may suggest ACS but many other  chronic and acute conditions are known to elevate hsTnI results.  Refer to the "Links" section for chest pain algorithms and additional  guidance. Performed at [facility], [address] [place], [place]   CBC with Differential/Platelet     Status: Abnormal   Collection Time: 12/29/20 12:17 AM  Result Value Ref Range   WBC 11.9 (H) 4.0 - 10.5 K/uL   RBC 4.14 3.87 - 5.11 MIL/uL   Hemoglobin 9.4 (L) 12.0 - 15.0 g/dL   HCT 12/31/20 (L) 93.7 - 16.9 %   MCV 77.1 (L) 80.0 - 100.0 fL   MCH 22.7 (L) 26.0 - 34.0 pg   MCHC 29.5 (L) 30.0 - 36.0 g/dL   RDW 67.8 (H) 93.8 - 10.1 %   Platelets 268 150 - 400 K/uL   nRBC 0.0 0.0 - 0.2 %   Neutrophils Relative % 92 %   Neutro Abs 10.9 (H) 1.7 - 7.7 K/uL   Lymphocytes Relative 6 %   Lymphs Abs 0.8 0.7 - 4.0 K/uL   Monocytes Relative 1 %   Monocytes Absolute 0.1 0.1 - 1.0 K/uL   Eosinophils Relative 0 %   Eosinophils Absolute 0.0 0.0 - 0.5 K/uL   Basophils Relative 0 %    Basophils Absolute 0.0 0.0 - 0.1 K/uL   Immature Granulocytes 1 %   Abs Immature Granulocytes 0.11 (H) 0.00 - 0.07 K/uL    Comment: Performed at [facility], [address] [place], [place]  Comprehensive metabolic panel     Status: Abnormal   Collection Time: 12/29/20 12:17 AM  Result Value Ref Range   Sodium 140 135 - 145 mmol/L   Potassium 3.9 3.5 - 5.1 mmol/L   Chloride  110 98 - 111 mmol/L   CO2 24 22 - 32 mmol/L   Glucose, Bld 110 (H) 70 - 99 mg/dL    Comment: Glucose reference range applies only to samples taken after fasting for at least 8 hours.   BUN 15 6 - 20 mg/dL   Creatinine, Ser 5.63 0.44 - 1.00 mg/dL   Calcium 9.3 8.9 - 87.5 mg/dL   Total Protein 7.3 6.5 - 8.1 g/dL   Albumin 3.8 3.5 - 5.0 g/dL   AST 33 15 - 41 U/L   ALT 34 0 - 44 U/L   Alkaline Phosphatase 49 38 - 126 U/L   Total Bilirubin 0.7 0.3 - 1.2 mg/dL   GFR, Estimated >64 >33 mL/min    Comment: (NOTE) Calculated using the CKD-EPI Creatinine Equation (2021)    Anion gap 6 5 - 15    Comment: Performed at [facility], [address] [place], [place] [address]  Troponin I (High Sensitivity)     Status: None   Collection Time: 12/29/20  1:58 AM  Result Value Ref Range   Troponin I (High Sensitivity) 2 <18 ng/L    Comment: (NOTE) Elevated high sensitivity troponin I (hsTnI) values and significant  changes across serial measurements may suggest ACS but many other  chronic and acute conditions are known to elevate hsTnI results.  Refer to the "Links" section for chest pain algorithms and additional  guidance. Performed at [facility], [address] [place], [place] [address]   CBC     Status: Abnormal   Collection Time: 12/30/20  3:39 AM  Result Value Ref Range   WBC 10.7 (H) 4.0 - 10.5 K/uL   RBC 4.09 3.87 - 5.11 MIL/uL   Hemoglobin 9.1 (L) 12.0 - 15.0 g/dL   HCT 06.3 (L) 01.6 - 01.0 %   MCV 75.8 (L) 80.0 - 100.0 fL   MCH 22.2  (L) 26.0 - 34.0 pg   MCHC 29.4 (L) 30.0 - 36.0 g/dL   RDW 93.2 (H) 35.5 - 73.2 %   Platelets 259 150 - 400 K/uL   nRBC 0.0 0.0 - 0.2 %    Comment: Performed at [facility], [address], [place], [place] [address]  Basic metabolic panel     Status: Abnormal   Collection Time: 12/30/20  3:39 AM  Result Value Ref Range   Sodium 136 135 - 145 mmol/L   Potassium 4.1 3.5 - 5.1 mmol/L   Chloride 106 98 - 111 mmol/L   CO2 25 22 - 32 mmol/L   Glucose, Bld 106 (H) 70 - 99 mg/dL    Comment: Glucose reference range applies only to samples taken after fasting for at least 8 hours.   BUN 12 6 - 20 mg/dL   Creatinine, Ser 2.70 0.44 - 1.00 mg/dL   Calcium 9.4 8.9 - 62.3 mg/dL   GFR, Estimated >76 >28 mL/min    Comment: (NOTE) Calculated using the CKD-EPI Creatinine Equation (2021)    Anion gap 5 5 - 15    Comment: Performed at [facility], [address] [place], [place] [address]   CT Angio Chest/Abd/Pel for Dissection W and/or W/WO  Result Date: 12/29/2020 CLINICAL DATA:  35 year old female with chest pain and shortness of breath, possible dissection EXAM: CT ANGIOGRAPHY CHEST, ABDOMEN AND PELVIS TECHNIQUE: Multidetector CT imaging through the chest, abdomen and pelvis was performed using the standard protocol during bolus administration of intravenous contrast. Multiplanar reconstructed images  and MIPs were obtained and reviewed to evaluate the vascular anatomy. CONTRAST:  59mL OMNIPAQUE IOHEXOL 350 MG/ML SOLN COMPARISON:  12/26/2020 FINDINGS: CTA CHEST FINDINGS Cardiovascular: Heart: No cardiomegaly. No pericardial fluid/thickening. No significant coronary calcifications. Aorta: Unremarkable course, caliber, contour of the thoracic aorta. No aneurysm or dissection flap. No periaortic fluid. Pulmonary arteries: Timing of the contrast bolus is not optimized for evaluation of pulmonary artery filling defects. Mediastinum/Nodes: No mediastinal adenopathy.  Unremarkable appearance of the thoracic esophagus. Unremarkable appearance of the thoracic inlet. Lungs/Pleura: Central airways are clear. No pleural effusion. No confluent airspace disease. No pneumothorax. CTA ABDOMEN AND PELVIS FINDINGS VASCULAR Aorta: Unremarkable course, caliber, contour of the abdominal aorta. No dissection, aneurysm, or periaortic fluid. Celiac: Patent, with no significant atherosclerotic changes. SMA: Patent, with no significant atherosclerotic changes. Renals: - Right: Right renal artery patent. - Left: Left renal artery patent. IMA: Inferior mesenteric artery is patent. Right lower extremity: Unremarkable course, caliber, and contour of the right iliac system. No aneurysm, dissection, or occlusion. Hypogastric artery is patent. Common femoral artery patent. Proximal SFA and profunda femoris patent. Left lower extremity: Unremarkable course, caliber, and contour of the left iliac system. No aneurysm, dissection, or occlusion. Hypogastric artery is patent. Common femoral artery patent. Proximal SFA and profunda femoris patent. Veins: Unremarkable appearance of the venous system. Review of the MIP images confirms the above findings. NON-VASCULAR Hepatobiliary: Unremarkable appearance of the liver. Unremarkable gall bladder. Pancreas: Unremarkable. Spleen: Unremarkable. Adrenals/Urinary Tract: - Right adrenal gland: Unremarkable - Left adrenal gland: Unremarkable. - Right kidney: No hydronephrosis, nephrolithiasis, inflammation, or ureteral dilation. No focal lesion. - Left Kidney: No hydronephrosis, nephrolithiasis, inflammation, or ureteral dilation. No focal lesion. - Urinary Bladder: Unremarkable. Stomach/Bowel: - Stomach: Unremarkable. - Small bowel: No air-fluid levels. No distension. Redemonstration of circumferential wall thickening and mural enhancement of the terminal ileum at the IC valve. There is relatively decreased stool volume within the distal ileum on the current CT compared  to the prior. - Appendix: Normal. - Colon: Unremarkable. Lymphatic: Multiple lymph nodes again noted within the ileocecal nodal chain of the right lower quadrant. No significant periaortic/preaortic lymph nodes. No significant pelvic nodes. Mesenteric: Trace free fluid within the anatomic pelvis, similar to the comparison. Reproductive: Unremarkable appearance of the uterus/adnexa Other: No hernia. Musculoskeletal: No acute displaced fracture. Unremarkable appearance of the SI joints. No significant degenerative changes of the hips. No bony canal narrowing. IMPRESSION: Negative for evidence of acute aortic syndrome. No significant vascular findings. Redemonstration of terminal ileitis, in this patient with given history of Crohn's disease. The degree of stool burden has decreased from the prior with no evidence of obstruction. Trace reactive free fluid within the pelvis. Signed, Yvone Neu. Reyne Dumas, RPVI [facility] Electronically Signed   By: Gilmer Mor D.O.   On: 12/29/2020 12:40      Assessment/Plan Crohn's ileitis The patient's chart has been reviewed as well as care everywhere and her imaging.  The patient's symptoms, imaging, and biopsies are most c/w crohn's disease.  It sounds like the patient is still not convinced of this diagnosis and therefore, since March has refused to initiate treatment for this problem.  She would like surgery to remove this area noted on her CT scan.  We had a lengthy discussion that this is not first-line treatment, and surgery is actually a last resort.  We discussed that given she has actually never even had medical management that jumping to surgery right away, especially with  a CT scan showing improvement already in 2 days with steroids, is not recommended.  We discussed the risks and complications associated with operating in the setting of an acute flare such as fistulea, abscess, anastomotic breakdown, and  further recurrence.  She understandably remains frustrated, but we do not recommend surgical intervention in this patient unless she were to develop a complete obstruction that did not resolve, perforation, or true failure of medical management.  She may continue with a diet as she tolerates with aggressive bowel regimen, miralax BID/TID, dulcolax, etc.   No further surgical recommendations at this time.   FEN - CLD VTE - Lovenox ID - none   Letha Cape, [facility] 12/30/2020, 4:19 PM Please see Amion for pager number during day hours 7:00am-4:30pm or 7:00am -11:30am on weekends

## 2020-12-30 NOTE — Progress Notes (Signed)
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  This is been associated with very small irregular bowel movements but no melena or hematochezia.  CT of the abdomen and pelvis revealed terminal ileitis and a relative obstruction at the level of the ileocecal valve.  GI was consulted and suggested initiation of Solu-Medrol.  New events last 24 hours / Subjective: Feeling much better than yesterday.  No longer having any abdominal pain.  Chest pain has not come back.  Wanting to discuss with GI regarding getting colonoscopy while in the hospital.  She has [facility] but does not want to wait that long.  Assessment & Plan:   Principal Problem:   Crohn's disease of small intestine with complication (HCC) Active Problems:   Abdominal pain   Insomnia   Microcytic anemia   Nausea and vomiting   Bowel obstruction (HCC)   Crohn's flare with terminal ileum stricture -GI following -Continue Solu-Medrol  Iron deficiency anemia -Hemoglobin remains stable -S/p IV iron  Chest pain -Troponin has been negative -EKG was obtained overnight which is unavailable for me to review on epic, copy is not in the shadow chart but was reportedly sinus bradycardia without ST change -CTA C/A/P without vascular etiology of her chest pain -CP resolved   HTN -Start HCTZ    DVT prophylaxis:  enoxaparin (LOVENOX) injection 40 mg Start: 12/26/20 2200  Code Status:     Code Status Orders  (From admission, onward)           Start     Ordered   12/26/20 1916  Full  code  Continuous        12/26/20 1916           Code Status History     Date Active Date Inactive Code Status Order ID Comments User Context   08/07/2020 1348 08/14/2020 2128 Full Code [id number]  Jae DireSegal, Jared E, MD ED      Family Communication: At bedside  Disposition Plan:  Status is: Inpatient  Remains inpatient appropriate because:IV treatments appropriate due to intensity of illness or inability to take PO  Dispo: The patient is from: Home              Anticipated d/c is to: Home              Patient currently is not medically stable to d/c. Remains on clear liquid diet, await GI follow up    Difficult to place patient No      Consultants:  GI   Antimicrobials:  Anti-infectives (From admission, onward)    None        Objective: Vitals:   12/29/20 0908 12/29/20 1332 12/29/20 2123 12/30/20 0545  BP: (!) 143/86 (!) 155/92 (!) 147/95 (!) 154/104  Pulse: (!) 52 (!) 51 60 (!) 57  Resp: 16 17 18 18   Temp: 98.2 F (36.8 C) 98.1 F (36.7 C) 98.7 F (37.1 C) 98.4 F (36.9 C)  TempSrc: Oral Oral Oral Oral  SpO2: 100% 100% 99% 100%  Weight:      Height:  Intake/Output Summary (Last 24 hours) at 12/30/2020 1151 Last data filed at 12/30/2020 1000 Gross per 24 hour  Intake 466.01 ml  Output 3600 ml  Net -3133.99 ml    Filed Weights   12/26/20 0957  Weight: 83 kg   Examination: General exam: Appears calm and comfortable  Respiratory system: Clear to auscultation. Respiratory effort normal. Cardiovascular system: S1 & S2 heard, RRR. No pedal edema. Gastrointestinal system: Abdomen is nondistended, soft and nontender. Normal bowel sounds heard. Central nervous system: Alert and oriented. Non focal exam. Speech clear  Extremities: Symmetric in appearance bilaterally  Skin: No rashes, lesions or ulcers on exposed skin  Psychiatry: Judgement and insight appear stable. Mood & affect appropriate.    Data Reviewed: I have personally reviewed following  labs and imaging studies  CBC: Recent Labs  Lab 12/26/20 1059 12/27/20 0453 12/29/20 0017 12/30/20 0339  WBC 5.7 8.5 11.9* 10.7*  NEUTROABS 4.3  --  10.9*  --   HGB 10.6* 9.7* 9.4* 9.1*  HCT 35.5* 33.9* 31.9* 31.0*  MCV 75.9* 76.0* 77.1* 75.8*  PLT 277 269 268 259    Basic Metabolic Panel: Recent Labs  Lab 12/26/20 1059 12/27/20 0453 12/29/20 0017 12/30/20 0339  NA 139 135 140 136  K 3.7 4.1 3.9 4.1  CL 107 104 110 106  CO2 26 24 24 25   GLUCOSE 86 171* 110* 106*  BUN 13 10 15 12   CREATININE 0.87 0.78 0.83 0.83  CALCIUM 9.4 9.3 9.3 9.4  MG  --  2.1  --   --   PHOS  --  2.7  --   --     GFR: Estimated Creatinine Clearance: 101.6 mL/min (by C-G formula based on SCr of 0.83 mg/dL). Liver Function Tests: Recent Labs  Lab 12/26/20 1059 12/27/20 0453 12/29/20 0017  AST 20 16 33  ALT 14 13 34  ALKPHOS 54 51 49  BILITOT 0.3 0.3 0.7  PROT 7.8 7.3 7.3  ALBUMIN 4.1 3.8 3.8    Recent Labs  Lab 12/26/20 1059  LIPASE 26    No results for input(s): AMMONIA in the last 168 hours. Coagulation Profile: No results for input(s): INR, PROTIME in the last 168 hours. Cardiac Enzymes: No results for input(s): CKTOTAL, CKMB, CKMBINDEX, TROPONINI in the last 168 hours. BNP (last 3 results) No results for input(s): PROBNP in the last 8760 hours. HbA1C: No results for input(s): HGBA1C in the last 72 hours. CBG: Recent Labs  Lab 12/27/20 0930  GLUCAP 150*    Lipid Profile: No results for input(s): CHOL, HDL, LDLCALC, TRIG, CHOLHDL, LDLDIRECT in the last 72 hours. Thyroid Function Tests: No results for input(s): TSH, T4TOTAL, FREET4, T3FREE, THYROIDAB in the last 72 hours. Anemia Panel: No results for input(s): VITAMINB12, FOLATE, FERRITIN, TIBC, IRON, RETICCTPCT in the last 72 hours.  Sepsis Labs: No results for input(s): PROCALCITON, LATICACIDVEN in the last 168 hours.  Recent Results (from the past 240 hour(s))  Resp Panel by RT-PCR (Flu A&B, Covid)  Nasopharyngeal Swab     Status: None   Collection Time: 12/26/20  8:28 PM   Specimen: Nasopharyngeal Swab; Nasopharyngeal(NP) swabs in vial transport medium  Result Value Ref Range Status   SARS Coronavirus 2 by RT PCR NEGATIVE NEGATIVE Final    Comment: (NOTE) SARS-CoV-2 target nucleic acids are NOT DETECTED.  The SARS-CoV-2 RNA is generally detectable in upper respiratory specimens during the acute phase of infection. The lowest concentration of SARS-CoV-2 viral copies this assay can detect is 138  copies/mL. A negative result does not preclude SARS-Cov-2 infection and should not be used as the sole basis for treatment or other patient management decisions. A negative result may occur with  improper specimen collection/handling, submission of specimen other than nasopharyngeal swab, presence of viral mutation(s) within the areas targeted by this assay, and inadequate number of viral copies(<138 copies/mL). A negative result must be combined with clinical observations, patient history, and epidemiological information. The expected result is Negative.  Fact Sheet for Patients:  BloggerCourse.com  Fact Sheet for Healthcare Providers:  SeriousBroker.it  This test is no t yet approved or cleared by the [place] FDA and  has been authorized for detection and/or diagnosis of SARS-CoV-2 by FDA under an Emergency Use Authorization (EUA). This EUA will remain  in effect (meaning this test can be used) for the duration of the COVID-19 declaration under Section 564(b)(1) of the Act, 21 U.S.C.section 360bbb-3(b)(1), unless the authorization is terminated  or revoked sooner.       Influenza A by PCR NEGATIVE NEGATIVE Final   Influenza B by PCR NEGATIVE NEGATIVE Final    Comment: (NOTE) The Xpert Xpress SARS-CoV-2/FLU/RSV plus assay is intended as an aid in the diagnosis of influenza from Nasopharyngeal swab specimens and should not be  used as a sole basis for treatment. Nasal washings and aspirates are unacceptable for Xpert Xpress SARS-CoV-2/FLU/RSV testing.  Fact Sheet for Patients: BloggerCourse.com  Fact Sheet for Healthcare Providers: SeriousBroker.it  This test is not yet approved or cleared by the [place] FDA and has been authorized for detection and/or diagnosis of SARS-CoV-2 by FDA under an Emergency Use Authorization (EUA). This EUA will remain in effect (meaning this test can be used) for the duration of the COVID-19 declaration under Section 564(b)(1) of the Act, 21 U.S.C. section 360bbb-3(b)(1), unless the authorization is terminated or revoked.  Performed at [facility], [address], [place], [place] [address]        Radiology Studies: CT Angio Chest/Abd/Pel for Dissection W and/or W/WO  Result Date: 12/29/2020 CLINICAL DATA:  35 year old female with chest pain and shortness of breath, possible dissection EXAM: CT ANGIOGRAPHY CHEST, ABDOMEN AND PELVIS TECHNIQUE: Multidetector CT imaging through the chest, abdomen and pelvis was performed using the standard protocol during bolus administration of intravenous contrast. Multiplanar reconstructed images and MIPs were obtained and reviewed to evaluate the vascular anatomy. CONTRAST:  1mL OMNIPAQUE IOHEXOL 350 MG/ML SOLN COMPARISON:  12/26/2020 FINDINGS: CTA CHEST FINDINGS Cardiovascular: Heart: No cardiomegaly. No pericardial fluid/thickening. No significant coronary calcifications. Aorta: Unremarkable course, caliber, contour of the thoracic aorta. No aneurysm or dissection flap. No periaortic fluid. Pulmonary arteries: Timing of the contrast bolus is not optimized for evaluation of pulmonary artery filling defects. Mediastinum/Nodes: No mediastinal adenopathy. Unremarkable appearance of the thoracic esophagus. Unremarkable appearance of the thoracic inlet. Lungs/Pleura: Central  airways are clear. No pleural effusion. No confluent airspace disease. No pneumothorax. CTA ABDOMEN AND PELVIS FINDINGS VASCULAR Aorta: Unremarkable course, caliber, contour of the abdominal aorta. No dissection, aneurysm, or periaortic fluid. Celiac: Patent, with no significant atherosclerotic changes. SMA: Patent, with no significant atherosclerotic changes. Renals: - Right: Right renal artery patent. - Left: Left renal artery patent. IMA: Inferior mesenteric artery is patent. Right lower extremity: Unremarkable course, caliber, and contour of the right iliac system. No aneurysm, dissection, or occlusion. Hypogastric artery is patent. Common femoral artery patent. Proximal SFA and profunda femoris patent. Left lower extremity: Unremarkable course, caliber, and contour of the left iliac system. No  aneurysm, dissection, or occlusion. Hypogastric artery is patent. Common femoral artery patent. Proximal SFA and profunda femoris patent. Veins: Unremarkable appearance of the venous system. Review of the MIP images confirms the above findings. NON-VASCULAR Hepatobiliary: Unremarkable appearance of the liver. Unremarkable gall bladder. Pancreas: Unremarkable. Spleen: Unremarkable. Adrenals/Urinary Tract: - Right adrenal gland: Unremarkable - Left adrenal gland: Unremarkable. - Right kidney: No hydronephrosis, nephrolithiasis, inflammation, or ureteral dilation. No focal lesion. - Left Kidney: No hydronephrosis, nephrolithiasis, inflammation, or ureteral dilation. No focal lesion. - Urinary Bladder: Unremarkable. Stomach/Bowel: - Stomach: Unremarkable. - Small bowel: No air-fluid levels. No distension. Redemonstration of circumferential wall thickening and mural enhancement of the terminal ileum at the IC valve. There is relatively decreased stool volume within the distal ileum on the current CT compared to the prior. - Appendix: Normal. - Colon: Unremarkable. Lymphatic: Multiple lymph nodes again noted within the  ileocecal nodal chain of the right lower quadrant. No significant periaortic/preaortic lymph nodes. No significant pelvic nodes. Mesenteric: Trace free fluid within the anatomic pelvis, similar to the comparison. Reproductive: Unremarkable appearance of the uterus/adnexa Other: No hernia. Musculoskeletal: No acute displaced fracture. Unremarkable appearance of the SI joints. No significant degenerative changes of the hips. No bony canal narrowing. IMPRESSION: Negative for evidence of acute aortic syndrome. No significant vascular findings. Redemonstration of terminal ileitis, in this patient with given history of Crohn's disease. The degree of stool burden has decreased from the prior with no evidence of obstruction. Trace reactive free fluid within the pelvis. Signed, Yvone Neu. Reyne Dumas, RPVI [facility] Electronically Signed   By: Gilmer Mor D.O.   On: 12/29/2020 12:40      Scheduled Meds:  (feeding supplement) PROSource Plus  30 mL Oral TID BM   enoxaparin (LOVENOX) injection  40 mg Subcutaneous Q24H   feeding supplement  1 Container Oral TID BM   methylPREDNISolone (SOLU-MEDROL) injection  40 mg Intravenous Q12H   multivitamin with minerals  1 tablet Oral Daily   Continuous Infusions:     LOS: 4 days      Time spent: 25 minutes   Noralee Stain, DO [facility] 12/30/2020, 11:51 AM   Available via Epic secure chat 7am-7pm After these hours, please refer to coverage provider listed on [email or web address]

## 2020-12-30 NOTE — Progress Notes (Signed)
U NASSIGNED PATIENT Subjective: Morgan Chung is a 35 year old black female who was diagnosed with Crohn's ileitis in March 2022 and was treated with short course of steroids.  She presented to the emergency room with recurrent abdominal pain and intractable nausea and vomiting progressing over the last 2 weeks.  She had problem chronic constipation and has used her to use enemas and laxatives to help with BM's.  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  The best of her recollection of seeing this patient in the past for similar problems when she is refusing to try alternate therapy for her Crohn's disease.  She was evaluated Dr. Scherry Ran and October 2021 when the EGD showed mild gastritis.  In March of this year she had a colonoscopy performed by Dr. Julious Oka that showed a possible stricture in the terminal ileum measuring about 6 mm.  The IC valve was also deformed and the stricture in the terminal ileum was thought to be fibrotic.  Biopsies were consistent with Crohn's disease and she was advised to start Remicade and azathioprine.  She however left the hospital Solu-Medrol.  [facility] in October when her symptoms recurred.  Today I discussed the possibility of starting Remicade or Humira with her but she does not feel that there is a right treatment for her condition.  She says she is noted to have a fibrotic stricture on several occasions in the past and surgery is her only option as per her research.  Even though I did not agree with this assessment I offered the patient a surgical evaluation for her satisfaction.  Patient does not seem receptive to any alternate therapy for Crohn's at this time.  He also tells me that Solu-Medrol prescribed for her is not helping her symptoms at all.  Objective: Vital signs  in last 24 hours: Temp:  [98.1 F (36.7 C)-98.7 F (37.1 C)] 98.4 F (36.9 C) (08/15 0545) Pulse Rate:  [51-60] 57 (08/15 0545) Resp:  [17-18] 18 (08/15 0545) BP: (147-155)/(92-104) 154/104 (08/15 0545) SpO2:  [99 %-100 %] 100 % (08/15 0545) Last BM Date: 12/24/20  Intake/Output from previous day: 08/14 0701 - 08/15 0700 In: 557.6 [P.O.:240; I.V.:317.6] Out: 3700 [Urine:3700] Intake/Output this shift: Total I/O In: 120 [P.O.:120] Out: 600 [Urine:600]  General appearance: alert, cooperative, appears stated age, no distress, and mildly obese Resp: clear to auscultation bilaterally Cardio: regular rate and rhythm, S1, S2 normal, no murmur, click, rub or gallop GI: soft, non-tender; bowel sounds normal; no masses,  no organomegaly Extremities: extremities normal, atraumatic, no cyanosis or edema  Lab Results: Recent Labs    12/29/20 0017 12/30/20 0339  WBC 11.9* 10.7*  HGB 9.4* 9.1*  HCT 31.9* 31.0*  PLT 268 259   BMET Recent Labs    12/29/20 0017 12/30/20 0339  NA 140 136  K 3.9 4.1  CL 110 106  CO2 24 25  GLUCOSE 110* 106*  BUN 15 12  CREATININE 0.83 0.83  CALCIUM 9.3 9.4   LFT Recent Labs    12/29/20 0017  PROT 7.3  ALBUMIN 3.8  AST 33  ALT 34  ALKPHOS 49  BILITOT 0.7   PT/INR No results for input(s): LABPROT, INR in the last 72 hours. Hepatitis Panel No results for input(s): HEPBSAG, HCVAB, HEPAIGM, HEPBIGM in the  last 72 hours. C-Diff No results for input(s): CDIFFTOX in the last 72 hours. No results for input(s): CDIFFPCR in the last 72 hours. Fecal Lactopherrin No results for input(s): FECLLACTOFRN in the last 72 hours.  Studies/Results: CT Angio Chest/Abd/Pel for Dissection W and/or W/WO  Result Date: 12/29/2020 CLINICAL DATA:  35 year old female with chest pain and shortness of breath, possible dissection EXAM: CT ANGIOGRAPHY CHEST, ABDOMEN AND PELVIS TECHNIQUE: Multidetector CT imaging through the chest, abdomen and pelvis was performed  using the standard protocol during bolus administration of intravenous contrast. Multiplanar reconstructed images and MIPs were obtained and reviewed to evaluate the vascular anatomy. CONTRAST:  5mL OMNIPAQUE IOHEXOL 350 MG/ML SOLN COMPARISON:  12/26/2020 FINDINGS: CTA CHEST FINDINGS Cardiovascular: Heart: No cardiomegaly. No pericardial fluid/thickening. No significant coronary calcifications. Aorta: Unremarkable course, caliber, contour of the thoracic aorta. No aneurysm or dissection flap. No periaortic fluid. Pulmonary arteries: Timing of the contrast bolus is not optimized for evaluation of pulmonary artery filling defects. Mediastinum/Nodes: No mediastinal adenopathy. Unremarkable appearance of the thoracic esophagus. Unremarkable appearance of the thoracic inlet. Lungs/Pleura: Central airways are clear. No pleural effusion. No confluent airspace disease. No pneumothorax. CTA ABDOMEN AND PELVIS FINDINGS VASCULAR Aorta: Unremarkable course, caliber, contour of the abdominal aorta. No dissection, aneurysm, or periaortic fluid. Celiac: Patent, with no significant atherosclerotic changes. SMA: Patent, with no significant atherosclerotic changes. Renals: - Right: Right renal artery patent. - Left: Left renal artery patent. IMA: Inferior mesenteric artery is patent. Right lower extremity: Unremarkable course, caliber, and contour of the right iliac system. No aneurysm, dissection, or occlusion. Hypogastric artery is patent. Common femoral artery patent. Proximal SFA and profunda femoris patent. Left lower extremity: Unremarkable course, caliber, and contour of the left iliac system. No aneurysm, dissection, or occlusion. Hypogastric artery is patent. Common femoral artery patent. Proximal SFA and profunda femoris patent. Veins: Unremarkable appearance of the venous system. Review of the MIP images confirms the above findings. NON-VASCULAR Hepatobiliary: Unremarkable appearance of the liver. Unremarkable gall  bladder. Pancreas: Unremarkable. Spleen: Unremarkable. Adrenals/Urinary Tract: - Right adrenal gland: Unremarkable - Left adrenal gland: Unremarkable. - Right kidney: No hydronephrosis, nephrolithiasis, inflammation, or ureteral dilation. No focal lesion. - Left Kidney: No hydronephrosis, nephrolithiasis, inflammation, or ureteral dilation. No focal lesion. - Urinary Bladder: Unremarkable. Stomach/Bowel: - Stomach: Unremarkable. - Small bowel: No air-fluid levels. No distension. Redemonstration of circumferential wall thickening and mural enhancement of the terminal ileum at the IC valve. There is relatively decreased stool volume within the distal ileum on the current CT compared to the prior. - Appendix: Normal. - Colon: Unremarkable. Lymphatic: Multiple lymph nodes again noted within the ileocecal nodal chain of the right lower quadrant. No significant periaortic/preaortic lymph nodes. No significant pelvic nodes. Mesenteric: Trace free fluid within the anatomic pelvis, similar to the comparison. Reproductive: Unremarkable appearance of the uterus/adnexa Other: No hernia. Musculoskeletal: No acute displaced fracture. Unremarkable appearance of the SI joints. No significant degenerative changes of the hips. No bony canal narrowing. IMPRESSION: Negative for evidence of acute aortic syndrome. No significant vascular findings. Redemonstration of terminal ileitis, in this patient with given history of Crohn's disease. The degree of stool burden has decreased from the prior with no evidence of obstruction. Trace reactive free fluid within the pelvis. Signed, Yvone Neu. Reyne Dumas, RPVI [facility] Electronically Signed   By: Gilmer Mor D.O.   On: 12/29/2020 12:40    Medications: I have reviewed the patient's current medications.  Assessment/Plan: 1) Crohn's ileitis with  TI stenosis on Solu-Medrol-patient needs to start biologics if she wants her symptoms  to improve but I was not able to convince her to do so.  I emphasized to her that Biologics do help with stricturing disease but she does not seem convinced at this point. I appreciate input from the surgical service. 2) Iron deficiency anemia.  LOS: 4 days   Charna Elizabeth 12/30/2020, 12:36 PM

## 2020-12-30 NOTE — Progress Notes (Signed)
Patient wants to be discharged because she is not going to have surgery. I paged Barnetta Chapel who called me back at [phone]. She said that surgery is not the first line of treatment and will alert the medical team.

## 2020-12-31 NOTE — Progress Notes (Signed)
   Late entry: I called patient per her request around 7:10pm on 8/15 but she did not answer. There was no plan for discharge, as outlined by recommendation from my previous progress note as well as GI and general surgery recommendations. Appears she left AMA last night.   Noralee Stain, DO [facility] 12/31/2020, 7:11 AM   Available via Epic secure chat 7am-7pm After these hours, please refer to coverage provider listed on [email or web address]

## 2020-12-31 NOTE — Discharge Summary (Signed)
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  This is been associated with very small irregular bowel movements but no melena or hematochezia.  CT of the abdomen and pelvis revealed terminal ileitis and a relative obstruction at the level of the ileocecal valve.  GI was consulted and suggested initiation of Solu-Medrol. She also received IV iron for iron deficiency anemia. GI recommended starting biologics which patient declined. General surgery consulted and recommended surgical intervention only in case of failure of medical management. Patient left AMA 8/15.   Discharge Instructions   Allergies as of 12/30/2020       Reactions   Banana Itching, Swelling, Other (See Comments)   Mouth itches and swells   Other Itching, Swelling, Other (See Comments)   (Fruit) MELON MIX- Mouth itches and swells   Latex Rash        Medication List     ASK your doctor about these medications    CALCIUM PO Take 1 tablet by mouth daily with breakfast.   ferrous sulfate 325 (65 FE) MG tablet Take 325 mg by mouth daily with breakfast.   ProAir HFA 108 (90 Base) MCG/ACT inhaler Generic drug: albuterol Inhale 2 puffs into the lungs every 4 (four) hours as needed for wheezing or shortness of breath (ex-induced).   VITAMIN D-3 PO Take 1 capsule by mouth daily with breakfast.   zolpidem 10 MG tablet Commonly known as: AMBIEN Take 10 mg by mouth at bedtime as needed for sleep.        Allergies  Allergen Reactions   Banana Itching, Swelling and  Other (See Comments)    Mouth itches and swells   Other Itching, Swelling and Other (See Comments)    (Fruit) MELON MIX- Mouth itches and swells   Latex Rash    Consultations: GI Gen surg   Procedures/Studies: CT ABDOMEN PELVIS W CONTRAST  Result Date: 12/26/2020 CLINICAL DATA:  Crohn's disease. Vomiting. Question obstruction or abscess. Generalized abdominal pain. EXAM: CT ABDOMEN AND PELVIS WITH CONTRAST TECHNIQUE: Multidetector CT imaging of the abdomen and pelvis was performed using the standard protocol following bolus administration of intravenous contrast. CONTRAST:  9mL OMNIPAQUE IOHEXOL 350 MG/ML SOLN COMPARISON:  08/12/2020 FINDINGS: Lower chest: Lung bases are clear.  No pleural or pericardial fluid. Hepatobiliary: Normal Pancreas: Normal Spleen: Normal Adrenals/Urinary Tract: Adrenal glands are normal. Kidneys are normal. Bladder is normal. Stomach/Bowel: The stomach and proximal small intestine are normal. As seen previously, there is wall thickening of the terminal ileum and right colon as far as the mid ascending colon. Surrounding edema affects the mesentery. There is fecalization material within distended distal ileum, probably secondary to functional obstruction at the ileocecal valve region related to swelling. There is no evidence of perforation or abscess. Vascular/Lymphatic: Normal except for a few small reactive right lower quadrant mesentery nodes. Reproductive: Normal Other: Small amount of free fluid in the pelvic cul-de-sac. No evidence of loculated collection or abscess. Musculoskeletal: Lower lumbar facet osteoarthritis. No acute finding IMPRESSION: Inflammatory wall thickening of the terminal ileum and right colon consistent with Crohn's enteritis. Relative  obstruction at the ileocecal valve region due to swelling with the distal terminal ileum being dilated and full of fecalized material. Electronically Signed   By: Paulina Fusi M.D.   On: 12/26/2020 14:08   CT Angio  Chest/Abd/Pel for Dissection W and/or W/WO  Result Date: 12/29/2020 CLINICAL DATA:  35 year old female with chest pain and shortness of breath, possible dissection EXAM: CT ANGIOGRAPHY CHEST, ABDOMEN AND PELVIS TECHNIQUE: Multidetector CT imaging through the chest, abdomen and pelvis was performed using the standard protocol during bolus administration of intravenous contrast. Multiplanar reconstructed images and MIPs were obtained and reviewed to evaluate the vascular anatomy. CONTRAST:  61mL OMNIPAQUE IOHEXOL 350 MG/ML SOLN COMPARISON:  12/26/2020 FINDINGS: CTA CHEST FINDINGS Cardiovascular: Heart: No cardiomegaly. No pericardial fluid/thickening. No significant coronary calcifications. Aorta: Unremarkable course, caliber, contour of the thoracic aorta. No aneurysm or dissection flap. No periaortic fluid. Pulmonary arteries: Timing of the contrast bolus is not optimized for evaluation of pulmonary artery filling defects. Mediastinum/Nodes: No mediastinal adenopathy. Unremarkable appearance of the thoracic esophagus. Unremarkable appearance of the thoracic inlet. Lungs/Pleura: Central airways are clear. No pleural effusion. No confluent airspace disease. No pneumothorax. CTA ABDOMEN AND PELVIS FINDINGS VASCULAR Aorta: Unremarkable course, caliber, contour of the abdominal aorta. No dissection, aneurysm, or periaortic fluid. Celiac: Patent, with no significant atherosclerotic changes. SMA: Patent, with no significant atherosclerotic changes. Renals: - Right: Right renal artery patent. - Left: Left renal artery patent. IMA: Inferior mesenteric artery is patent. Right lower extremity: Unremarkable course, caliber, and contour of the right iliac system. No aneurysm, dissection, or occlusion. Hypogastric artery is patent. Common femoral artery patent. Proximal SFA and profunda femoris patent. Left lower extremity: Unremarkable course, caliber, and contour of the left iliac system. No aneurysm, dissection, or  occlusion. Hypogastric artery is patent. Common femoral artery patent. Proximal SFA and profunda femoris patent. Veins: Unremarkable appearance of the venous system. Review of the MIP images confirms the above findings. NON-VASCULAR Hepatobiliary: Unremarkable appearance of the liver. Unremarkable gall bladder. Pancreas: Unremarkable. Spleen: Unremarkable. Adrenals/Urinary Tract: - Right adrenal gland: Unremarkable - Left adrenal gland: Unremarkable. - Right kidney: No hydronephrosis, nephrolithiasis, inflammation, or ureteral dilation. No focal lesion. - Left Kidney: No hydronephrosis, nephrolithiasis, inflammation, or ureteral dilation. No focal lesion. - Urinary Bladder: Unremarkable. Stomach/Bowel: - Stomach: Unremarkable. - Small bowel: No air-fluid levels. No distension. Redemonstration of circumferential wall thickening and mural enhancement of the terminal ileum at the IC valve. There is relatively decreased stool volume within the distal ileum on the current CT compared to the prior. - Appendix: Normal. - Colon: Unremarkable. Lymphatic: Multiple lymph nodes again noted within the ileocecal nodal chain of the right lower quadrant. No significant periaortic/preaortic lymph nodes. No significant pelvic nodes. Mesenteric: Trace free fluid within the anatomic pelvis, similar to the comparison. Reproductive: Unremarkable appearance of the uterus/adnexa Other: No hernia. Musculoskeletal: No acute displaced fracture. Unremarkable appearance of the SI joints. No significant degenerative changes of the hips. No bony canal narrowing. IMPRESSION: Negative for evidence of acute aortic syndrome. No significant vascular findings. Redemonstration of terminal ileitis, in this patient with given history of Crohn's disease. The degree of stool burden has decreased from the prior with no evidence of obstruction. Trace reactive free fluid within the pelvis. Signed, Yvone Neu. Reyne Dumas, RPVI [facility] Radiology Electronically Signed   By: Gilmer Mor D.O.   On: 12/29/2020 12:40       Discharge Exam: Vitals:   12/30/20 0545 12/30/20 1411  BP: Marland Kitchen)  154/104 (!) 158/102  Pulse: (!) 57 (!) 49  Resp: 18 16  Temp: 98.4 F (36.9 C) 98.7 F (37.1 C)  SpO2: 100% 100%      The results of significant diagnostics from this hospitalization (including imaging, microbiology, ancillary and laboratory) are listed below for reference.     Microbiology: Recent Results (from the past 240 hour(s))  Resp Panel by RT-PCR (Flu A&B, Covid) Nasopharyngeal Swab     Status: None   Collection Time: 12/26/20  8:28 PM   Specimen: Nasopharyngeal Swab; Nasopharyngeal(NP) swabs in vial transport medium  Result Value Ref Range Status   SARS Coronavirus 2 by RT PCR NEGATIVE NEGATIVE Final    Comment: (NOTE) SARS-CoV-2 target nucleic acids are NOT DETECTED.  The SARS-CoV-2 RNA is generally detectable in upper respiratory specimens during the acute phase of infection. The lowest concentration of SARS-CoV-2 viral copies this assay can detect is 138 copies/mL. A negative result does not preclude SARS-Cov-2 infection and should not be used as the sole basis for treatment or other patient management decisions. A negative result may occur with  improper specimen collection/handling, submission of specimen other than nasopharyngeal swab, presence of viral mutation(s) within the areas targeted by this assay, and inadequate number of viral copies(<138 copies/mL). A negative result must be combined with clinical observations, patient history, and epidemiological information. The expected result is Negative.  Fact Sheet for Patients:  BloggerCourse.com  Fact Sheet for Healthcare Providers:  SeriousBroker.it  This test is no t yet approved or cleared by the [place] FDA and  has been authorized for detection and/or diagnosis of  SARS-CoV-2 by FDA under an Emergency Use Authorization (EUA). This EUA will remain  in effect (meaning this test can be used) for the duration of the COVID-19 declaration under Section 564(b)(1) of the Act, 21 U.S.C.section 360bbb-3(b)(1), unless the authorization is terminated  or revoked sooner.       Influenza A by PCR NEGATIVE NEGATIVE Final   Influenza B by PCR NEGATIVE NEGATIVE Final    Comment: (NOTE) The Xpert Xpress SARS-CoV-2/FLU/RSV plus assay is intended as an aid in the diagnosis of influenza from Nasopharyngeal swab specimens and should not be used as a sole basis for treatment. Nasal washings and aspirates are unacceptable for Xpert Xpress SARS-CoV-2/FLU/RSV testing.  Fact Sheet for Patients: BloggerCourse.com  Fact Sheet for Healthcare Providers: SeriousBroker.it  This test is not yet approved or cleared by the [place] FDA and has been authorized for detection and/or diagnosis of SARS-CoV-2 by FDA under an Emergency Use Authorization (EUA). This EUA will remain in effect (meaning this test can be used) for the duration of the COVID-19 declaration under Section 564(b)(1) of the Act, 21 U.S.C. section 360bbb-3(b)(1), unless the authorization is terminated or revoked.  Performed at [facility], [address] [place], [place] [address]      Labs: BNP (last 3 results) No results for input(s): BNP in the last 8760 hours. Basic Metabolic Panel: Recent Labs  Lab 12/26/20 1059 12/27/20 0453 12/29/20 0017 12/30/20 0339  NA 139 135 140 136  K 3.7 4.1 3.9 4.1  CL 107 104 110 106  CO2 26 24 24 25   GLUCOSE 86 171* 110* 106*  BUN 13 10 15 12   CREATININE 0.87 0.78 0.83 0.83  CALCIUM 9.4 9.3 9.3 9.4  MG  --  2.1  --   --   PHOS  --  2.7  --   --    Liver Function Tests:  Recent Labs  Lab 12/26/20 1059 12/27/20 0453 12/29/20 0017  AST 20 16 33  ALT 14 13 34  ALKPHOS 54 51 49   BILITOT 0.3 0.3 0.7  PROT 7.8 7.3 7.3  ALBUMIN 4.1 3.8 3.8   Recent Labs  Lab 12/26/20 1059  LIPASE 26   No results for input(s): AMMONIA in the last 168 hours. CBC: Recent Labs  Lab 12/26/20 1059 12/27/20 0453 12/29/20 0017 12/30/20 0339  WBC 5.7 8.5 11.9* 10.7*  NEUTROABS 4.3  --  10.9*  --   HGB 10.6* 9.7* 9.4* 9.1*  HCT 35.5* 33.9* 31.9* 31.0*  MCV 75.9* 76.0* 77.1* 75.8*  PLT 277 269 268 259   Cardiac Enzymes: No results for input(s): CKTOTAL, CKMB, CKMBINDEX, TROPONINI in the last 168 hours. BNP: Invalid input(s): POCBNP CBG: Recent Labs  Lab 12/27/20 0930  GLUCAP 150*   D-Dimer No results for input(s): DDIMER in the last 72 hours. Hgb A1c No results for input(s): HGBA1C in the last 72 hours. Lipid Profile No results for input(s): CHOL, HDL, LDLCALC, TRIG, CHOLHDL, LDLDIRECT in the last 72 hours. Thyroid function studies No results for input(s): TSH, T4TOTAL, T3FREE, THYROIDAB in the last 72 hours.  Invalid input(s): FREET3 Anemia work up No results for input(s): VITAMINB12, FOLATE, FERRITIN, TIBC, IRON, RETICCTPCT in the last 72 hours. Urinalysis    Component Value Date/Time   COLORURINE YELLOW 12/26/2020 1110   APPEARANCEUR HAZY (A) 12/26/2020 1110   LABSPEC 1.016 12/26/2020 1110   PHURINE 6.0 12/26/2020 1110   GLUCOSEU NEGATIVE 12/26/2020 1110   HGBUR NEGATIVE 12/26/2020 1110   BILIRUBINUR NEGATIVE 12/26/2020 1110   KETONESUR NEGATIVE 12/26/2020 1110   PROTEINUR NEGATIVE 12/26/2020 1110   NITRITE NEGATIVE 12/26/2020 1110   LEUKOCYTESUR NEGATIVE 12/26/2020 1110   Sepsis Labs Invalid input(s): PROCALCITONIN,  WBC,  LACTICIDVEN Microbiology Recent Results (from the past 240 hour(s))  Resp Panel by RT-PCR (Flu A&B, Covid) Nasopharyngeal Swab     Status: None   Collection Time: 12/26/20  8:28 PM   Specimen: Nasopharyngeal Swab; Nasopharyngeal(NP) swabs in vial transport medium  Result Value Ref Range Status   SARS Coronavirus 2 by RT PCR  NEGATIVE NEGATIVE Final    Comment: (NOTE) SARS-CoV-2 target nucleic acids are NOT DETECTED.  The SARS-CoV-2 RNA is generally detectable in upper respiratory specimens during the acute phase of infection. The lowest concentration of SARS-CoV-2 viral copies this assay can detect is 138 copies/mL. A negative result does not preclude SARS-Cov-2 infection and should not be used as the sole basis for treatment or other patient management decisions. A negative result may occur with  improper specimen collection/handling, submission of specimen other than nasopharyngeal swab, presence of viral mutation(s) within the areas targeted by this assay, and inadequate number of viral copies(<138 copies/mL). A negative result must be combined with clinical observations, patient history, and epidemiological information. The expected result is Negative.  Fact Sheet for Patients:  BloggerCourse.com  Fact Sheet for Healthcare Providers:  SeriousBroker.it  This test is no t yet approved or cleared by the [place] FDA and  has been authorized for detection and/or diagnosis of SARS-CoV-2 by FDA under an Emergency Use Authorization (EUA). This EUA will remain  in effect (meaning this test can be used) for the duration of the COVID-19 declaration under Section 564(b)(1) of the Act, 21 U.S.C.section 360bbb-3(b)(1), unless the authorization is terminated  or revoked sooner.       Influenza A by PCR NEGATIVE NEGATIVE Final   Influenza B by  PCR NEGATIVE NEGATIVE Final    Comment: (NOTE) The Xpert Xpress SARS-CoV-2/FLU/RSV plus assay is intended as an aid in the diagnosis of influenza from Nasopharyngeal swab specimens and should not be used as a sole basis for treatment. Nasal washings and aspirates are unacceptable for Xpert Xpress SARS-CoV-2/FLU/RSV testing.  Fact Sheet for Patients: BloggerCourse.comhttps://www.fda.gov/media/152166/download  Fact Sheet for  Healthcare Providers: SeriousBroker.ithttps://www.fda.gov/media/152162/download  This test is not yet approved or cleared by the [place] FDA and has been authorized for detection and/or diagnosis of SARS-CoV-2 by FDA under an Emergency Use Authorization (EUA). This EUA will remain in effect (meaning this test can be used) for the duration of the COVID-19 declaration under Section 564(b)(1) of the Act, 21 U.S.C. section 360bbb-3(b)(1), unless the authorization is terminated or revoked.  Performed at [facility], [address] [place], [place] [id number]      SIGNED:  Noralee StainJennifer Karlin Heilman, DO [facility] 12/31/2020, 7:12 AM

## 2021-02-03 ENCOUNTER — Encounter: Payer: Medicaid Other | Attending: Obstetrics and Gynecology | Admitting: Dietician

## 2021-03-07 ENCOUNTER — Other Ambulatory Visit: Payer: Self-pay | Admitting: Gastroenterology

## 2021-03-07 NOTE — Telephone Encounter (Signed)
No history of our practice ordering this medication

## 2021-04-01 ENCOUNTER — Ambulatory Visit: Payer: Medicaid Other | Admitting: Obstetrics and Gynecology

## 2021-04-18 ENCOUNTER — Ambulatory Visit: Payer: Medicaid Other | Admitting: Dietician

## 2021-06-12 ENCOUNTER — Encounter: Payer: Self-pay | Admitting: Obstetrics

## 2021-06-12 ENCOUNTER — Other Ambulatory Visit (HOSPITAL_COMMUNITY)
Admission: RE | Admit: 2021-06-12 | Discharge: 2021-06-12 | Disposition: A | Discharge status: Home / Self Care | Payer: Medicaid Other | Source: Ambulatory Visit | Attending: Obstetrics | Admitting: Obstetrics

## 2021-06-12 ENCOUNTER — Other Ambulatory Visit: Payer: Self-pay

## 2021-06-12 ENCOUNTER — Ambulatory Visit: Payer: Medicaid Other | Admitting: Obstetrics

## 2021-06-12 VITALS — BP 139/87 | HR 78 | Ht 65.0 in | Wt 201.0 lb

## 2021-06-12 DIAGNOSIS — N898 Other specified noninflammatory disorders of vagina: Secondary | ICD-10-CM | POA: Insufficient documentation

## 2021-06-12 DIAGNOSIS — Z113 Encounter for screening for infections with a predominantly sexual mode of transmission: Secondary | ICD-10-CM | POA: Diagnosis not present

## 2021-06-12 DIAGNOSIS — Z3009 Encounter for other general counseling and advice on contraception: Secondary | ICD-10-CM

## 2021-06-12 DIAGNOSIS — E669 Obesity, unspecified: Secondary | ICD-10-CM

## 2021-06-12 DIAGNOSIS — R030 Elevated blood-pressure reading, without diagnosis of hypertension: Secondary | ICD-10-CM

## 2021-06-12 DIAGNOSIS — G43109 Migraine with aura, not intractable, without status migrainosus: Secondary | ICD-10-CM

## 2021-06-12 DIAGNOSIS — E66811 Obesity, class 1: Secondary | ICD-10-CM

## 2021-06-12 LAB — POCT URINE PREGNANCY: Preg Test, Ur: NEGATIVE

## 2021-06-12 MED ORDER — LO LOESTRIN FE 1 MG-10 MCG / 10 MCG PO TABS
1.0000 | ORAL_TABLET | Freq: Every day | ORAL | 11 refills | Status: DC
Start: 2021-06-12 — End: 2021-06-12

## 2021-06-12 MED ORDER — CARVEDILOL 25 MG PO TABS: 25.0000 mg | ORAL_TABLET | Freq: Two times a day (BID) | ORAL | 11 refills | Status: DC

## 2021-06-12 MED ORDER — TRIAMTERENE-HCTZ 37.5-25 MG PO CAPS: 1.0000 | ORAL_CAPSULE | Freq: Every day | ORAL | 11 refills | Status: DC

## 2021-06-12 NOTE — Progress Notes (Signed)
GYN presents for [facility] Consult and STD screening and cultures. Denies vaginal discharge, odor, itching.  Last PAP 12/09/2020

## 2021-06-12 NOTE — Progress Notes (Signed)
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 The patient has no complaints today. The patient is sexually active. Pertinent past medical history: none.  The information documented in the HPI was reviewed and verified.  Menstrual History: OB History   No obstetric history on file.      Patient's last menstrual period was 05/29/2021 (exact date).   Patient Active Problem List   Diagnosis Date Noted   Bowel obstruction (HCC) 12/26/2020   Constipation 08/23/2020   Nausea and vomiting 08/23/2020   Abdominal pain 08/07/2020   Insomnia 08/07/2020   Microcytic anemia 08/07/2020   Crohn's disease of small intestine with complication (HCC)    Past Medical History:  Diagnosis Date   Anemia    Asthma    Crohn's disease (HCC)    Gastritis    GERD (gastroesophageal reflux disease)    Insomnia     Past Surgical History:  Procedure Laterality Date   CESAREAN SECTION       Current Outpatient Medications:    carvedilol (COREG) 25 MG tablet, Take 1 tablet (25 mg total) by mouth 2 (two) times daily with a meal., Disp: 60 tablet, Rfl: 11   triamterene-hydrochlorothiazide (DYAZIDE) 37.5-25 MG capsule, Take 1 each (1 capsule total) by mouth daily., Disp: 30 capsule, Rfl: 11   CALCIUM PO, Take 1 tablet by mouth daily with breakfast., Disp: , Rfl:    Cholecalciferol (VITAMIN D-3 PO), Take 1 capsule by mouth daily with breakfast., Disp: , Rfl:    ferrous sulfate 325 (65 FE) MG tablet, Take 325 mg by mouth daily with breakfast., Disp: , Rfl:    PROAIR HFA 108 (90 Base) MCG/ACT inhaler, Inhale 2 puffs into the lungs every 4 (four) hours as needed for wheezing or shortness of breath (ex-induced)., Disp: , Rfl:    zolpidem (AMBIEN) 10 MG tablet, Take 10 mg by mouth at bedtime as needed for sleep., Disp: , Rfl:  Allergies  Allergen Reactions   Banana Itching, Swelling and Other (See Comments)    Mouth itches and swells   Other Itching, Swelling and Other  (See Comments)    (Fruit) MELON MIX- Mouth itches and swells   Latex Rash    Social History   Tobacco Use   Smoking status: Never   Smokeless tobacco: Never  Substance Use Topics   Alcohol use: Yes    Comment: occasionally ( 3 times per month)    Family History  Problem Relation Age of Onset   Hypertension Mother    Irritable bowel syndrome Mother    Diabetes Father    Hypertension Father    Irritable bowel syndrome Sister    Colon cancer Neg Hx    Pancreatic cancer Neg Hx    Stomach cancer Neg Hx    Esophageal cancer Neg Hx    Liver cancer Neg Hx    Colon polyps Neg Hx        Review of Systems Constitutional: negative for weight loss Genitourinary: positive for vaginal discharge.  negative for abnormal menstrual periods    Objective:   BP 139/87 (BP Location: Right Arm, Cuff Size: Large)    Pulse 78    Ht 5\' 5"  (1.651 m)    Wt 201 lb (91.2 kg)    LMP 05/29/2021 (Exact Date)    BMI 33.45 kg/m    General:   Alert and no distress  Skin:   no rash or abnormalities  Lungs:   clear to  auscultation bilaterally  Heart:   regular rate and rhythm, S1, S2 normal, no murmur, click, rub or gallop  Breasts:   normal without suspicious masses, skin or nipple changes or axillary nodes  Abdomen:  normal findings: no organomegaly, soft, non-tender and no hernia  Pelvis:  External genitalia: normal general appearance Urinary system: urethral meatus normal and bladder without fullness, nontender Vaginal: normal without tenderness, induration or masses Cervix: normal appearance Adnexa: normal bimanual exam Uterus: anteverted and non-tender, normal size   Lab Review Urine pregnancy test Labs reviewed yes Radiologic studies reviewed yes  I have spent a total of 20 minutes of face-to-face time, excluding clinical staff time, reviewing notes and preparing to see patient, ordering tests and/or medications, and counseling the patient.   Assessment:    36 y.o., not starting OCP  (estrogen/progesterone), because of migraines with aura and uncontrolled hypertension  Plan:   1. Vaginal discharge Rx: - Cervicovaginal ancillary only( [place])  2. Screening for STD (sexually transmitted disease) Rx: - HepB+HepC+HIV Panel - RPR  3. Encounter for other general counseling and advice on contraception Rx: - POCT urine pregnancy  4. Obesity (BMI 30.0-34.9)  5. Elevated BP without diagnosis of hypertension Rx: - carvedilol (COREG) 25 MG tablet; Take 1 tablet (25 mg total) by mouth 2 (two) times daily with a meal.  Dispense: 60 tablet; Refill: 11 - triamterene-hydrochlorothiazide (DYAZIDE) 37.5-25 MG capsule; Take 1 each (1 capsule total) by mouth daily.  Dispense: 30 capsule; Refill: 11 - patient will follow up with her PCP for further BP management  6. Migraine with aura and without status migrainosus, not intractable - referred to Internal Medicine - informed patient that hormonal contraception is contraindicated nwith migraines with aura because of increased risk of clots and stroke    All questions answered. Chlamydia specimen. Contraception: OCP (estrogen/progesterone). Discussed healthy lifestyle modifications. Neurosurgeon distributed. Follow up in 6 months. GC specimen. Pregnancy test, result: negative. Wet prep.  Meds ordered this encounter  Medications   DISCONTD: LO LOESTRIN FE 1 MG-10 MCG / 10 MCG tablet    Sig: Take 1 tablet by mouth daily.    Dispense:  28 tablet    Refill:  11    Submit other coverage code 3  BIN:  [id number]  PCN:  [id number]   GRP:  [id number]   ID:  [id number]   carvedilol (COREG) 25 MG tablet    Sig: Take 1 tablet (25 mg total) by mouth 2 (two) times daily with a meal.    Dispense:  60 tablet    Refill:  11   triamterene-hydrochlorothiazide (DYAZIDE) 37.5-25 MG capsule    Sig: Take 1 each (1 capsule total) by mouth daily.    Dispense:  30 capsule    Refill:  11   Orders Placed This Encounter  Procedures    HepB+HepC+HIV Panel   RPR   POCT urine pregnancy    Shelly Bombard, MD 06/12/2021 4:39 PM

## 2021-06-13 ENCOUNTER — Ambulatory Visit: Payer: Medicaid Other | Admitting: Dietician

## 2021-06-13 LAB — CERVICOVAGINAL ANCILLARY ONLY
Bacterial Vaginitis (gardnerella): POSITIVE — AB
Candida Glabrata: NEGATIVE
Candida Vaginitis: NEGATIVE
Chlamydia: NEGATIVE
Comment: NEGATIVE
Comment: NEGATIVE
Comment: NEGATIVE
Comment: NEGATIVE
Comment: NEGATIVE
Comment: NORMAL
Neisseria Gonorrhea: NEGATIVE
Trichomonas: NEGATIVE

## 2021-06-16 ENCOUNTER — Other Ambulatory Visit: Payer: Self-pay

## 2021-06-16 DIAGNOSIS — B9689 Other specified bacterial agents as the cause of diseases classified elsewhere: Secondary | ICD-10-CM

## 2021-06-16 MED ORDER — METRONIDAZOLE 500 MG PO TABS: 500.0000 mg | ORAL_TABLET | Freq: Two times a day (BID) | ORAL | 0 refills | Status: DC

## 2021-06-17 ENCOUNTER — Other Ambulatory Visit: Payer: Self-pay | Admitting: Obstetrics

## 2021-07-02 IMAGING — DX DG ABDOMEN 2V
2 series · 2 of 2 positions shown · non-contrast
Comparison: 08/07/2020

CLINICAL DATA: Constipation.  Vomiting.

EXAM:
ABDOMEN - 2 VIEW

[abdomen erect]
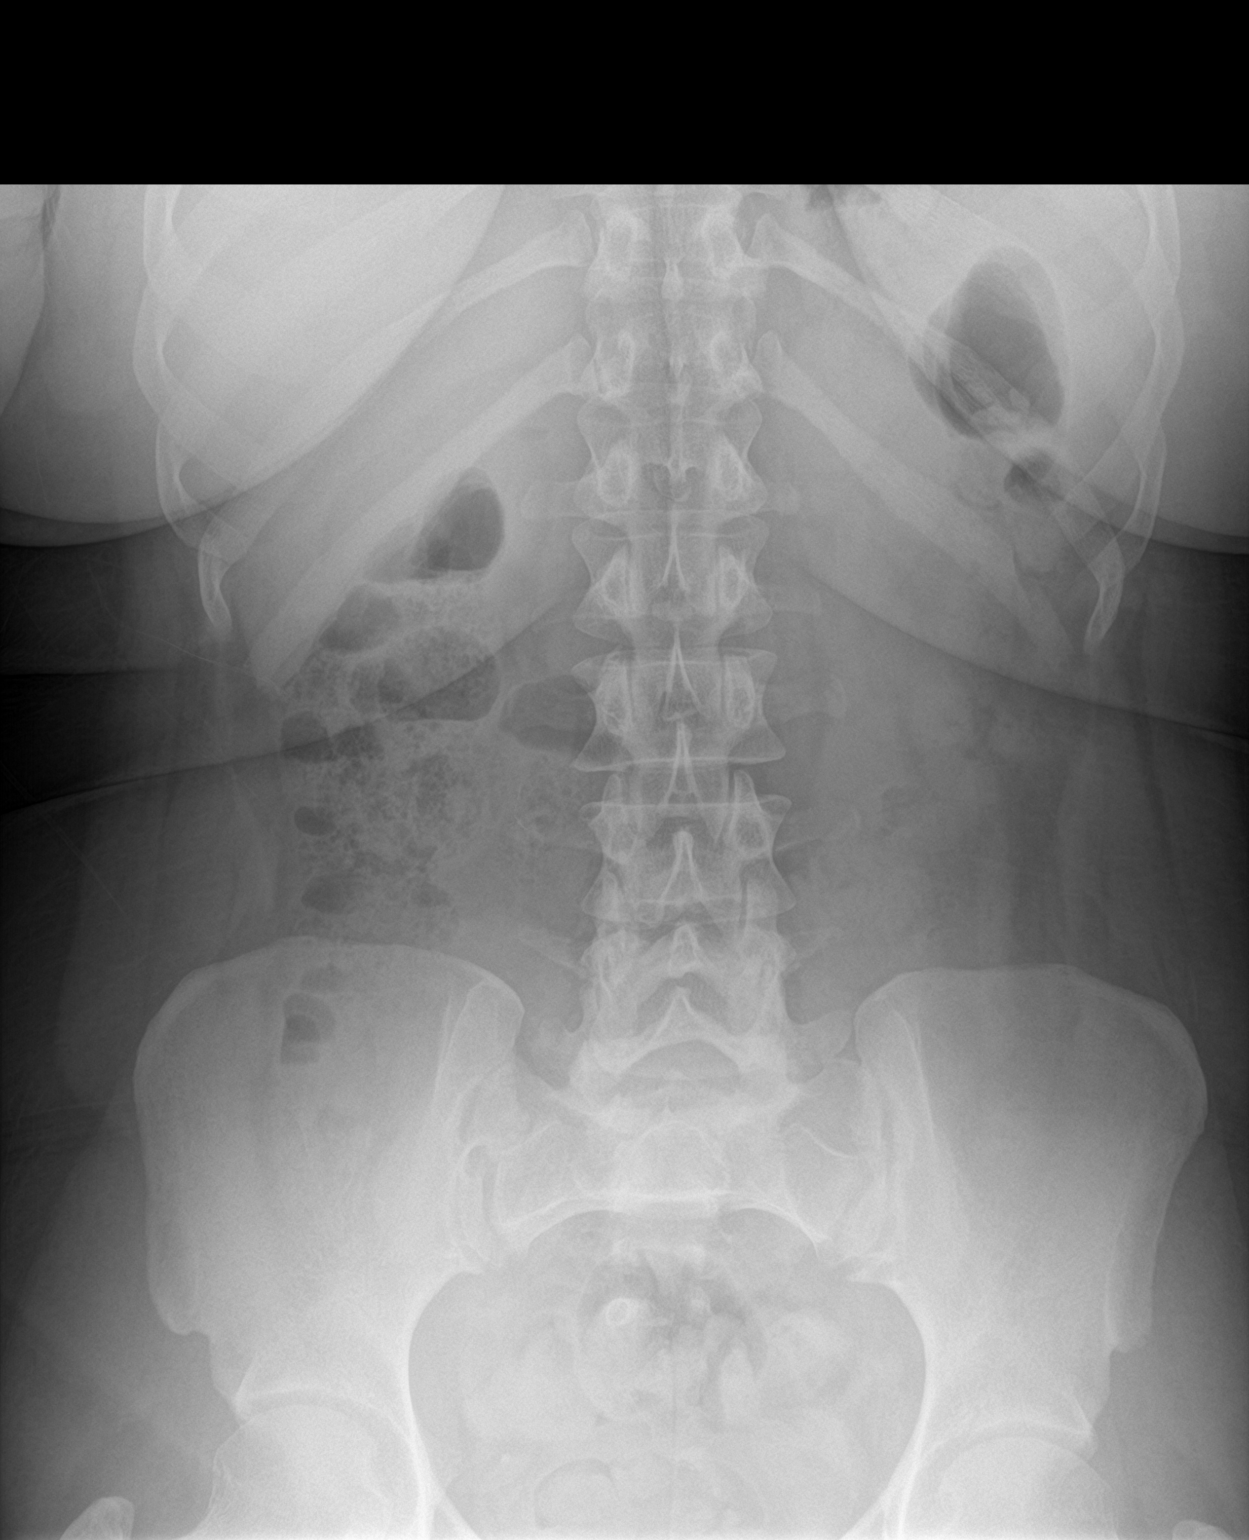

[abdomen supine]
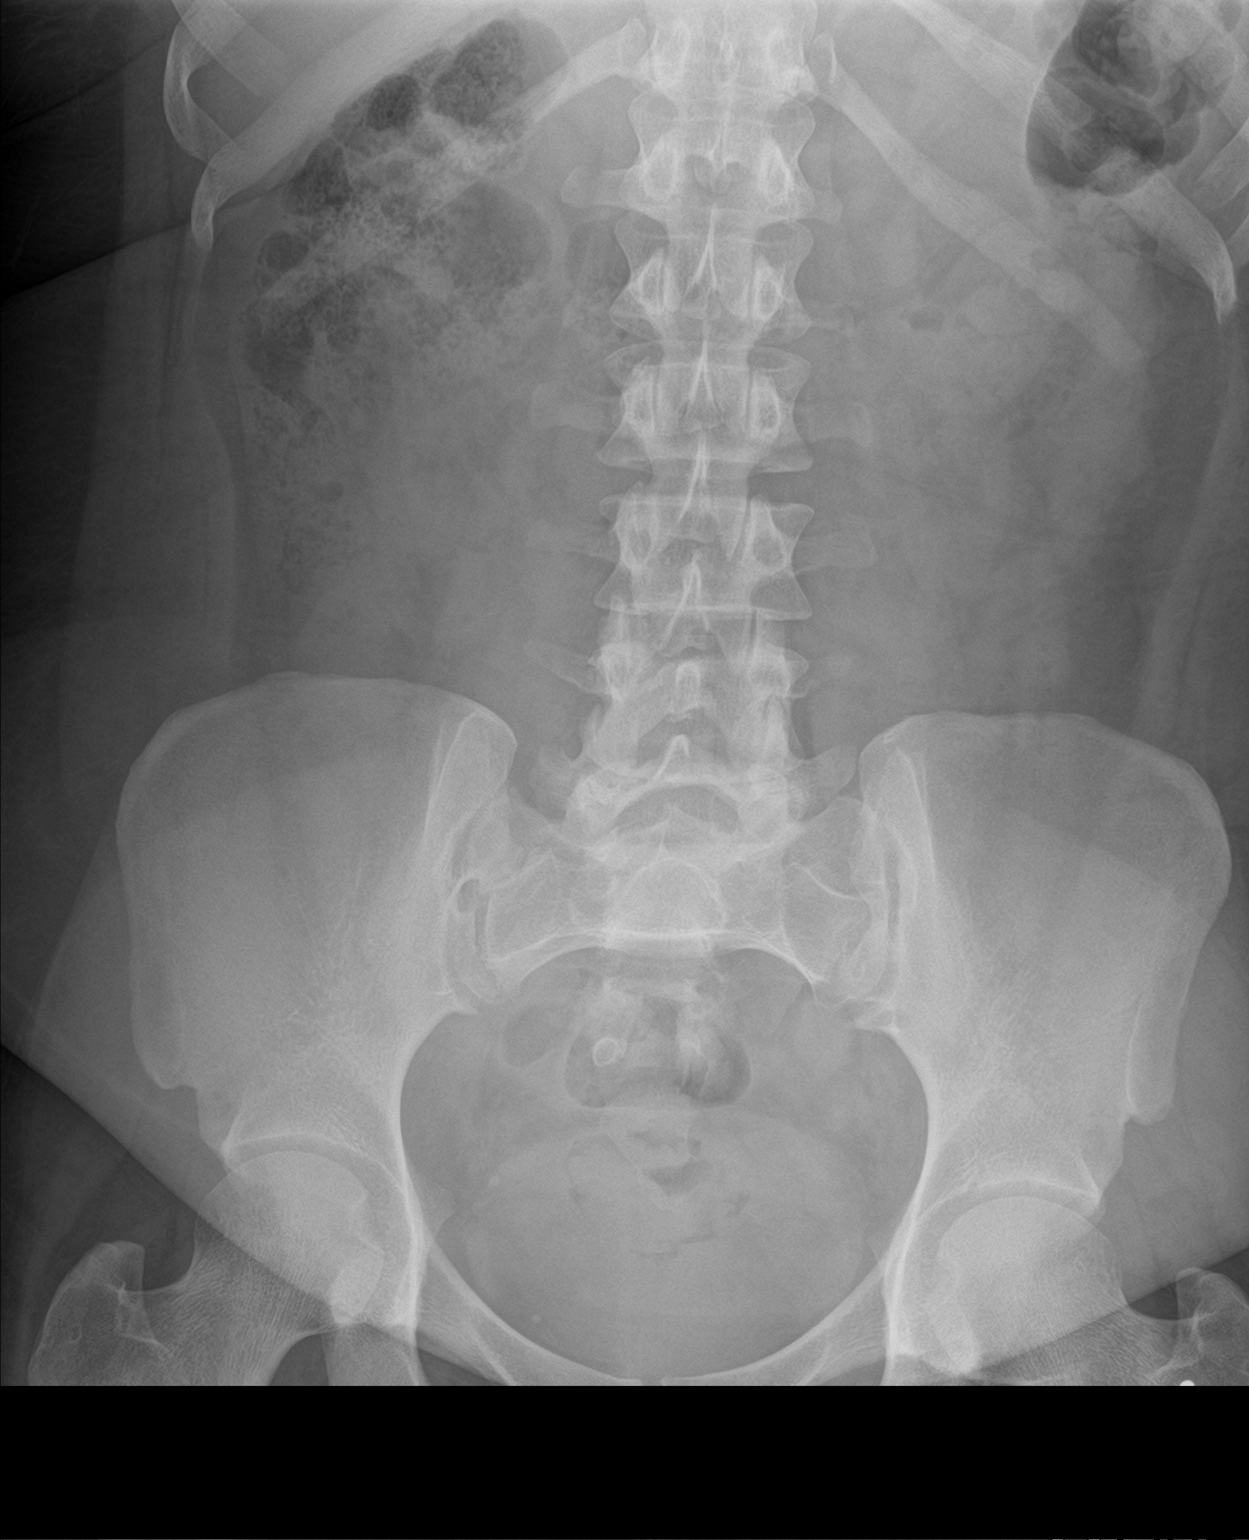

[2 of 2 positions shown; findings below may reference images not displayed]

FINDINGS: The bowel gas pattern is nonobstructive. Moderate stool burden
identified at the level of the hepatic flexure and rectum. There is
no evidence of free air. No radio-opaque calculi or other
significant radiographic abnormality is seen.
IMPRESSION: 1. Nonobstructive bowel gas pattern.
2. Moderate stool burden noted consistent with the clinical history
of constipation.

## 2021-09-14 ENCOUNTER — Emergency Department (HOSPITAL_BASED_OUTPATIENT_CLINIC_OR_DEPARTMENT_OTHER)
Admission: EM | Admit: 2021-09-14 | Discharge: 2021-09-15 | Disposition: A | Discharge status: Home / Self Care | Payer: Medicaid Other | Attending: Emergency Medicine | Admitting: Emergency Medicine

## 2021-09-14 ENCOUNTER — Encounter (HOSPITAL_BASED_OUTPATIENT_CLINIC_OR_DEPARTMENT_OTHER): Payer: Self-pay | Admitting: Emergency Medicine

## 2021-09-14 ENCOUNTER — Emergency Department (HOSPITAL_BASED_OUTPATIENT_CLINIC_OR_DEPARTMENT_OTHER): Payer: Medicaid Other

## 2021-09-14 ENCOUNTER — Other Ambulatory Visit: Payer: Self-pay

## 2021-09-14 DIAGNOSIS — Z91040 Latex allergy status: Secondary | ICD-10-CM | POA: Insufficient documentation

## 2021-09-14 DIAGNOSIS — R0789 Other chest pain: Secondary | ICD-10-CM | POA: Insufficient documentation

## 2021-09-14 DIAGNOSIS — R200 Anesthesia of skin: Secondary | ICD-10-CM | POA: Insufficient documentation

## 2021-09-14 DIAGNOSIS — R42 Dizziness and giddiness: Secondary | ICD-10-CM | POA: Diagnosis not present

## 2021-09-14 DIAGNOSIS — R110 Nausea: Secondary | ICD-10-CM | POA: Insufficient documentation

## 2021-09-14 DIAGNOSIS — I10 Essential (primary) hypertension: Secondary | ICD-10-CM | POA: Diagnosis not present

## 2021-09-14 DIAGNOSIS — R079 Chest pain, unspecified: Secondary | ICD-10-CM

## 2021-09-14 HISTORY — DX: Essential (primary) hypertension: I10

## 2021-09-14 LAB — BASIC METABOLIC PANEL
Anion gap: 10 (ref 5–15)
BUN: 14 mg/dL (ref 6–20)
CO2: 26 mmol/L (ref 22–32)
Calcium: 9.2 mg/dL (ref 8.9–10.3)
Chloride: 103 mmol/L (ref 98–111)
Creatinine, Ser: 1.03 mg/dL — ABNORMAL HIGH (ref 0.44–1.00)
GFR, Estimated: >60 mL/min (ref >60)
Glucose, Bld: 92 mg/dL (ref 70–99)
Potassium: 3.3 mmol/L — ABNORMAL LOW (ref 3.5–5.1)
Sodium: 139 mmol/L (ref 135–145)

## 2021-09-14 LAB — CBC
HCT: 32.6 % — ABNORMAL LOW (ref 36.0–46.0)
Hemoglobin: 9.7 g/dL — ABNORMAL LOW (ref 12.0–15.0)
MCH: 23.2 pg — ABNORMAL LOW (ref 26.0–34.0)
MCHC: 29.8 g/dL — ABNORMAL LOW (ref 30.0–36.0)
MCV: 78 fL — ABNORMAL LOW (ref 80.0–100.0)
Platelets: 202 10*3/uL (ref 150–400)
RBC: 4.18 MIL/uL (ref 3.87–5.11)
RDW: 16.7 % — ABNORMAL HIGH (ref 11.5–15.5)
WBC: 7.1 10*3/uL (ref 4.0–10.5)
nRBC: 0 % (ref 0.0–0.2)

## 2021-09-14 LAB — MAGNESIUM: Magnesium: 2 mg/dL (ref 1.7–2.4)

## 2021-09-14 LAB — PREGNANCY, URINE: Preg Test, Ur: NEGATIVE

## 2021-09-14 LAB — TROPONIN I (HIGH SENSITIVITY)
Troponin I (High Sensitivity): 2 ng/L (ref <18)
Troponin I (High Sensitivity): 3 ng/L (ref <18)

## 2021-09-14 LAB — D-DIMER, QUANTITATIVE: D-Dimer, Quant: 0.37 ug/mL-FEU (ref 0.00–0.50)

## 2021-09-14 MED ORDER — POTASSIUM CHLORIDE CRYS ER 20 MEQ PO TBCR
40.0000 meq | EXTENDED_RELEASE_TABLET | Freq: Once | ORAL | Status: AC
  Administered 2021-09-14: 40 meq via ORAL
  Filled 2021-09-14: qty 2

## 2021-09-14 MED ORDER — LIDOCAINE VISCOUS HCL 2 % MT SOLN
15.0000 mL | Freq: Once | OROMUCOSAL | Status: AC
  Administered 2021-09-14: 15 mL via ORAL
  Filled 2021-09-14: qty 15

## 2021-09-14 MED ORDER — LACTATED RINGERS IV BOLUS
1000.0000 mL | Freq: Once | INTRAVENOUS | Status: AC
Start: 2021-09-14 — End: 2021-09-14
  Administered 2021-09-14: 1000 mL via INTRAVENOUS

## 2021-09-14 MED ORDER — METOCLOPRAMIDE HCL 5 MG/ML IJ SOLN
10.0000 mg | Freq: Once | INTRAMUSCULAR | Status: AC
  Administered 2021-09-14: 10 mg via INTRAVENOUS
  Filled 2021-09-14: qty 2

## 2021-09-14 MED ORDER — ALUM & MAG HYDROXIDE-SIMETH 200-200-20 MG/5ML PO SUSP
30.0000 mL | Freq: Once | ORAL | Status: AC
  Administered 2021-09-14: 30 mL via ORAL
  Filled 2021-09-14: qty 30

## 2021-09-14 MED ORDER — ASPIRIN 81 MG PO CHEW
324.0000 mg | CHEWABLE_TABLET | Freq: Once | ORAL | Status: AC
Start: 2021-09-14 — End: 2021-09-14
  Administered 2021-09-14: 324 mg via ORAL
  Filled 2021-09-14: qty 4

## 2021-09-14 MED ORDER — NITROGLYCERIN 2 % TD OINT
1.0000 [in_us] | TOPICAL_OINTMENT | Freq: Once | TRANSDERMAL | Status: AC
  Administered 2021-09-14: 1 [in_us] via TOPICAL
  Filled 2021-09-14: qty 1

## 2021-09-14 MED ORDER — KETOROLAC TROMETHAMINE 15 MG/ML IJ SOLN
15.0000 mg | Freq: Once | INTRAMUSCULAR | Status: AC
  Administered 2021-09-14: 15 mg via INTRAVENOUS
  Filled 2021-09-14: qty 1

## 2021-09-14 NOTE — ED Triage Notes (Signed)
Left chest pain , radiating to left arm , tingling to left arm . Hx HTN , high BP today per pt , denies shortness of breath or coughing . Denies anxiety as well . Alert and oriented x 4 . No neuro symptoms  ?

## 2021-09-14 NOTE — ED Provider Notes (Signed)
?[facility] EMER[facility]ENCY DEPT ?Provider Note ? ? ?CSN: [id number] ?Arrival date & time: 09/14/21  1811 ? ?  ? ?History ? ?No chief complaint on file. ? ? ?Morgan Chung is a 36 y.o. female. ? ?The history is provided by the patient.  ?Chest Pain ?Pain location:  L chest ?Pain quality: pressure   ?Pain radiates to:  L shoulder ?Pain severity:  Mild (6/10 --> 4/10) ?Onset quality:  Sudden ?Duration:  2 hours ?Timing:  Constant ?Progression:  Improving ?Chronicity:  New ?Context: not breathing   ?Relieved by:  None tried ?Ineffective treatments:  None tried ?Associated symptoms: dizziness, nausea and numbness (L arm w/parasthesias)   ? ?HPI: A 36 year old patient with a history of hypertension and obesity presents for evaluation of chest pain. Initial onset of pain was less than one hour ago. The patient's chest pain is described as heaviness/pressure/tightness and is not worse with exertion. The patient complains of nausea. The patient's chest pain is middle- or left-sided, is not well-localized, is not sharp and does not radiate to the arms/jaw/neck. The patient denies diaphoresis. The patient has no history of stroke, has no history of peripheral artery disease, has not smoked in the past 90 days, denies any history of treated diabetes, has no relevant family history of coronary artery disease (first degree relative at less than age 35) and has no history of hypercholesterolemia.  ? ?Home Medications ?Prior to Admission medications   ?Medication Sig Start Date End Date Taking? Authorizing Provider  ?CALCIUM PO Take 1 tablet by mouth daily with breakfast.    [provider]  ?carvedilol (COREG) 25 MG tablet Take 1 tablet (25 mg total) by mouth 2 (two) times daily with a meal. 06/12/21   Brock BadHarper, Charles A, MD  ?Cholecalciferol (VITAMIN D-3 PO) Take 1 capsule by mouth daily with breakfast.    [provider]  ?ferrous sulfate 325 (65 FE) MG tablet Take 325 mg by mouth daily with breakfast.     [provider]  ?metroNIDAZOLE (FLAGYL) 500 MG tablet Take 1 tablet (500 mg total) by mouth 2 (two) times daily. 06/16/21   Brock BadHarper, Charles A, MD  ?[facility]FA 108 [phone](90 Base) MCG/ACT inhaler Inhale 2 puffs into the lungs every 4 (four) hours as needed for wheezing or shortness of breath (ex-induced). 06/11/20   [provider]  ?triamterene-hydrochlorothiazide (DYAZIDE) 37.5-25 MG capsule Take 1 each (1 capsule total) by mouth daily. 06/12/21   Brock BadHarper, Charles A, MD  ?zolpidem (AMBIEN) 10 MG tablet Take 10 mg by mouth at bedtime as needed for sleep. 01/09/20   [provider]  ?   ? ?Allergies    ?Banana, Other, and Latex   ? ?Review of Systems   ?Review of Systems  ?Cardiovascular:  Positive for chest pain.  ?Gastrointestinal:  Positive for nausea.  ?Genitourinary:   ?     On menstrual period, started 5 days ago  ?Neurological:  Positive for dizziness and numbness (L arm w/parasthesias).  ?All other systems reviewed and are negative. ? ?Physical Exam ?Updated Vital Signs ?BP (!) 134/98 (BP Location: Right Arm)   Pulse 64   Temp 98.5 ?F (36.9 ?C)   Resp 16   Ht 5\' 4"  (1.626 m)   Wt 92.1 kg   LMP 09/10/2021 (Exact Date)   SpO2 100%   BMI 34.84 kg/m?  ?Physical Exam ?Vitals and nursing note reviewed.  ?Constitutional:   ?   General: She is not in acute distress. ?   Appearance: Normal  appearance. She is well-developed and normal weight. She is not ill-appearing, toxic-appearing or diaphoretic.  ?HENT:  ?   Head: Normocephalic and atraumatic.  ?   Right Ear: External ear normal.  ?   Left Ear: External ear normal.  ?   Nose: Nose normal.  ?   Mouth/Throat:  ?   Mouth: Mucous membranes are moist.  ?   Pharynx: Oropharynx is clear.  ?Eyes:  ?   General: No scleral icterus. ?   Extraocular Movements: Extraocular movements intact.  ?   Conjunctiva/sclera: Conjunctivae normal.  ?Cardiovascular:  ?   Rate and Rhythm: Normal rate and regular rhythm.  ?   Heart sounds: No murmur heard. ?Pulmonary:   ?   Effort: Pulmonary effort is normal. No respiratory distress.  ?   Breath sounds: Normal breath sounds. No wheezing or rales.  ?Chest:  ?   Chest wall: Tenderness present.  ?Abdominal:  ?   Palpations: Abdomen is soft.  ?   Tenderness: There is no abdominal tenderness.  ?Musculoskeletal:     ?   General: No swelling. Normal range of motion.  ?   Cervical back: Normal range of motion and neck supple.  ?   Right lower leg: No edema.  ?   Left lower leg: No edema.  ?Skin: ?   General: Skin is warm and dry.  ?   Capillary Refill: Capillary refill takes less than 2 seconds.  ?   Coloration: Skin is not jaundiced or pale.  ?Neurological:  ?   General: No focal deficit present.  ?   Mental Status: She is alert and oriented to person, place, and time.  ?   Cranial Nerves: No cranial nerve deficit.  ?   Sensory: No sensory deficit.  ?   Motor: No weakness.  ?   Coordination: Coordination normal.  ?Psychiatric:     ?   Mood and Affect: Mood normal.     ?   Behavior: Behavior normal.     ?   Thought Content: Thought content normal.     ?   Judgment: Judgment normal.  ? ? ?ED Results / Procedures / Treatments   ?Labs ?(all labs ordered are listed, but only abnormal results are displayed) ?Labs Reviewed  ?BASIC METABOLIC PANEL - Abnormal; Notable for the following components:  ?    Result Value  ? Potassium 3.3 (*)   ? Creatinine, Ser 1.03 (*)   ? All other components within normal limits  ?CBC - Abnormal; Notable for the following components:  ? Hemoglobin 9.7 (*)   ? HCT 32.6 (*)   ? MCV 78.0 (*)   ? MCH 23.2 (*)   ? MCHC 29.8 (*)   ? RDW 16.7 (*)   ? All other components within normal limits  ?PREGNANCY, URINE  ?MAGNESIUM  ?D-DIMER, QUANTITATIVE  ?TROPONIN I (HIGH SENSITIVITY)  ?TROPONIN I (HIGH SENSITIVITY)  ? ? ?EKG ?EKG Interpretation ? ?Date/Time:  Sunday September 14 2021 18:24:14 EDT ?Ventricular Rate:  78 ?PR Interval:  156 ?QRS Duration: 88 ?QT Interval:  390 ?QTC Calculation: 444 ?R Axis:   21 ?Text  Interpretation: Normal sinus rhythm Nonspecific T wave abnormality Abnormal ECG No previous ECGs available Confirmed by Gloris Manchester [phone]) on 09/14/2021 6:53:45 PM ? ?Radiology ?DG Chest Port 1 View ? ?Result Date: 09/14/2021 ?CLINICAL DATA:  Left-sided chest pain radiating to left arm. EXAM: PORTABLE CHEST 1 VIEW COMPARISON:  Radiograph 08/07/2020, CT 12/29/2020 FINDINGS: Upper normal heart size, likely accentuated  by portable technique.The cardiomediastinal contours are normal. The lungs are clear. Pulmonary vasculature is normal. No consolidation, pleural effusion, or pneumothorax. No acute osseous abnormalities are seen. IMPRESSION: No acute chest findings. Electronically Signed   By: Narda Rutherford M.D.   On: 09/14/2021 18:48   ? ?Procedures ?Procedures  ? ? ?Medications Ordered in ED ?Medications  ?aspirin chewable tablet 324 mg (324 mg Oral Given 09/14/21 1949)  ?nitroGLYCERIN (NITROGLYN) 2 % ointment 1 inch (1 inch Topical Given 09/14/21 1949)  ?lactated ringers bolus 1,000 mL ( Intravenous Stopped 09/14/21 2154)  ?potassium chloride SA (KLOR-CON M) CR tablet 40 mEq (40 mEq Oral Given 09/14/21 2054)  ?metoCLOPramide (REGLAN) injection 10 mg (10 mg Intravenous Given 09/14/21 2310)  ?ketorolac (TORADOL) 15 MG/ML injection 15 mg (15 mg Intravenous Given 09/14/21 2310)  ?alum & mag hydroxide-simeth (MAALOX/MYLANTA) 200-200-20 MG/5ML suspension 30 mL (30 mLs Oral Given 09/14/21 2308)  ?  And  ?lidocaine (XYLOCAINE) 2 % viscous mouth solution 15 mL (15 mLs Oral Given 09/14/21 2307)  ? ? ?ED Course/ Medical Decision Making/ A&P ?  ?HEAR Score: 3                       ?Medical Decision Making ?Amount and/or Complexity of Data Reviewed ?Labs: ordered. ?Radiology: ordered. ? ?Risk ?OTC drugs. ?Prescription drug management. ? ? ?Patient is a healthy 36 year old female presenting for chest pain.  Location is left-sided and she does endorse some left arm symptoms.  She is also concerned about her elevated blood pressure today.   EKG shows sinus rhythm without evidence of ST segment changes.  Based on symptoms, risk factors, and EKG findings, patient is a heart score of 3.  ASA and NTG were given.  Patient underwent laboratory work-up including D-dimer

## 2021-12-19 ENCOUNTER — Ambulatory Visit
Admission: RE | Admit: 2021-12-19 | Discharge: 2021-12-19 | Disposition: A | Discharge status: Home / Self Care | Payer: Medicaid Other | Source: Ambulatory Visit | Attending: Family Medicine | Admitting: Family Medicine

## 2021-12-19 ENCOUNTER — Other Ambulatory Visit: Payer: Self-pay | Admitting: Family Medicine

## 2021-12-19 DIAGNOSIS — M25552 Pain in left hip: Secondary | ICD-10-CM

## 2022-01-24 ENCOUNTER — Encounter: Payer: Self-pay | Admitting: *Deleted

## 2022-01-24 ENCOUNTER — Ambulatory Visit
Admission: EM | Admit: 2022-01-24 | Discharge: 2022-01-24 | Disposition: A | Discharge status: Home / Self Care | Payer: Medicaid Other

## 2022-01-24 DIAGNOSIS — T148XXA Other injury of unspecified body region, initial encounter: Secondary | ICD-10-CM

## 2022-01-24 NOTE — ED Triage Notes (Signed)
Reports falling asleep with heating pad to lower abd; woke this AM with blister to left lower abd.

## 2022-01-24 NOTE — ED Provider Notes (Signed)
Patient reports falling asleep with heating pad to lower abd last night; woke this AM with fluid-filled blister on her left lower abd. patient advised to keep wound covered and monitor for drainage, redness or pain.  No billable services provided for this patient.   Theadora Rama Scales, PA-C 01/24/22 1424

## 2022-02-24 ENCOUNTER — Ambulatory Visit: Payer: Medicaid Other | Admitting: Internal Medicine

## 2022-03-03 NOTE — Progress Notes (Unsigned)
New Patient Note  RE: Morgan Chung MRN: [id number] DOB: March 25, 1986 Date of Office Visit: 03/04/2022  Consult requested by: [name], MD Primary care provider: [name], MD  Chief Complaint: No chief complaint on file.  History of Present Illness: I had the pleasure of seeing Morgan Chung for initial evaluation at the [facility] on 03/03/2022. She is a 36 y.o. female, who is referred here by [name], MD for the evaluation of allergic rhinitis and rash.  She reports symptoms of ***. Symptoms have been going on for *** years. The symptoms are present *** all year around with worsening in ***. Other triggers include exposure to ***. Anosmia: ***. Headache: ***. She has used *** with ***fair improvement in symptoms. Sinus infections: ***. Previous work up includes: ***. Previous ENT evaluation: ***. Previous sinus imaging: ***. History of nasal polyps: ***. Last eye exam: ***. History of reflux: ***.  Rash started about *** ago. Mainly occurs on her ***. Describes them as ***. Individual rashes lasts about ***. No ecchymosis upon resolution. Associated symptoms include: ***.  Frequency of episodes: ***. Suspected triggers are ***. Denies any *** fevers, chills, changes in medications, foods, personal care products or recent infections. She has tried the following therapies: *** with *** benefit. Systemic steroids ***. Currently on ***.  Previous work up includes: ***. Previous history of rash/hives: ***. Patient is up to date with the following cancer screening tests: ***.  Assessment and Plan: [name] is a 36 y.o. female with: No problem-specific Assessment & Plan notes found for this encounter.  No follow-ups on file.  No orders of the defined types were placed in this encounter.  Lab Orders  No laboratory test(s) ordered today    Other allergy screening: Asthma: {Blank single:19197::"yes","no"} Rhino conjunctivitis: {Blank  single:19197::"yes","no"} Food allergy: {Blank single:19197::"yes","no"} Medication allergy: {Blank single:19197::"yes","no"} Hymenoptera allergy: {Blank single:19197::"yes","no"} Urticaria: {Blank single:19197::"yes","no"} Eczema:{Blank single:19197::"yes","no"} History of recurrent infections suggestive of immunodeficency: {Blank single:19197::"yes","no"}  Diagnostics: Spirometry:  Tracings reviewed. Her effort: {Blank single:19197::"Good reproducible efforts.","It was hard to get consistent efforts and there is a question as to whether this reflects a maximal maneuver.","Poor effort, data can not be interpreted."} FVC: ***L FEV1: ***L, ***% predicted FEV1/FVC ratio: ***% Interpretation: {Blank single:19197::"Spirometry consistent with mild obstructive disease","Spirometry consistent with moderate obstructive disease","Spirometry consistent with severe obstructive disease","Spirometry consistent with possible restrictive disease","Spirometry consistent with mixed obstructive and restrictive disease","Spirometry uninterpretable due to technique","Spirometry consistent with normal pattern","No overt abnormalities noted given today's efforts"}.  Please see scanned spirometry results for details.  Skin Testing: {Blank single:19197::"Select foods","Environmental allergy panel","Environmental allergy panel and select foods","Food allergy panel","None","Deferred due to recent antihistamines use"}. *** Results discussed with patient/family.   Past Medical History: Patient Active Problem List   Diagnosis Date Noted  . Bowel obstruction (HCC) 12/26/2020  . Constipation 08/23/2020  . Nausea and vomiting 08/23/2020  . Abdominal pain 08/07/2020  . Insomnia 08/07/2020  . Microcytic anemia 08/07/2020  . Crohn's disease of small intestine with complication [facility])    Past Medical History:  Diagnosis Date  . Anemia   . Asthma   . Crohn's disease (HCC)   . Gastritis   . GERD (gastroesophageal  reflux disease)   . Hypertension   . Insomnia    Past Surgical History: Past Surgical History:  Procedure Laterality Date  . BOWEL RESECTION    . CESAREAN SECTION     Medication List:  Current Outpatient Medications  Medication Sig Dispense Refill  . AMLODIPINE BENZOATE PO  Take 10 mg by mouth.    Marland Kitchen HYDROCHLOROTHIAZIDE PO Take by mouth.    . MERCAPTOPURINE PO Take by mouth.    Marland Kitchen PROAIR HFA 108 (90 Base) MCG/ACT inhaler Inhale 2 puffs into the lungs every 4 (four) hours as needed for wheezing or shortness of breath (ex-induced).     No current facility-administered medications for this visit.   Allergies: Allergies  Allergen Reactions  . Banana Itching, Swelling and Other (See Comments)    Mouth itches and swells  . Other Itching, Swelling and Other (See Comments)    (Fruit) MELON MIX- Mouth itches and swells  . Latex Rash   Social History: Social History   Socioeconomic History  . Marital status: Single    Spouse name: Not on file  . Number of children: Not on file  . Years of education: Not on file  . Highest education level: Not on file  Occupational History  . Not on file  Tobacco Use  . Smoking status: Never  . Smokeless tobacco: Never  Vaping Use  . Vaping Use: Never used  Substance and Sexual Activity  . Alcohol use: Yes    Comment: occasionally  . Drug use: Never  . Sexual activity: Not Currently    Partners: Male  Other Topics Concern  . Not on file  Social History Narrative  . Not on file   Social Determinants of Health   Financial Resource Strain: Not on file  Food Insecurity: Not on file  Transportation Needs: Not on file  Physical Activity: Not on file  Stress: Not on file  Social Connections: Not on file   Lives in a ***. Smoking: *** Occupation: ***  Environmental HistoryFreight forwarder in the house: Estate agent in the family room: {Blank single:19197::"yes","no"} Carpet in the bedroom: {Blank  single:19197::"yes","no"} Heating: {Blank single:19197::"electric","gas","heat pump"} Cooling: {Blank single:19197::"central","window","heat pump"} Pet: {Blank single:19197::"yes ***","no"}  Family History: Family History  Problem Relation Age of Onset  . Hypertension Mother   . Irritable bowel syndrome Mother   . Diabetes Father   . Hypertension Father   . Irritable bowel syndrome Sister   . Colon cancer Neg Hx   . Pancreatic cancer Neg Hx   . Stomach cancer Neg Hx   . Esophageal cancer Neg Hx   . Liver cancer Neg Hx   . Colon polyps Neg Hx    Problem                               Relation Asthma                                   *** Eczema                                *** Food allergy                          *** Allergic rhino conjunctivitis     ***  Review of Systems  Constitutional:  Negative for appetite change, chills, fever and unexpected weight change.  HENT:  Negative for congestion and rhinorrhea.   Eyes:  Negative for itching.  Respiratory:  Negative for cough, chest tightness, shortness of breath and wheezing.   Cardiovascular:  Negative for chest pain.  Gastrointestinal:  Negative for abdominal pain.  Genitourinary:  Negative for difficulty urinating.  Skin:  Negative for rash.  Neurological:  Negative for headaches.   Objective: There were no vitals taken for this visit. There is no height or weight on file to calculate BMI. Physical Exam Vitals and nursing note reviewed.  Constitutional:      Appearance: Normal appearance. She is well-developed.  HENT:     Head: Normocephalic and atraumatic.     Right Ear: Tympanic membrane and external ear normal.     Left Ear: Tympanic membrane and external ear normal.     Nose: Nose normal.     Mouth/Throat:     Mouth: Mucous membranes are moist.     Pharynx: Oropharynx is clear.  Eyes:     Conjunctiva/sclera: Conjunctivae normal.  Cardiovascular:     Rate and Rhythm: Normal rate and regular rhythm.      Heart sounds: Normal heart sounds. No murmur heard.    No friction rub. No gallop.  Pulmonary:     Effort: Pulmonary effort is normal.     Breath sounds: Normal breath sounds. No wheezing, rhonchi or rales.  Musculoskeletal:     Cervical back: Neck supple.  Skin:    General: Skin is warm.     Findings: No rash.  Neurological:     Mental Status: She is alert and oriented to person, place, and time.  Psychiatric:        Behavior: Behavior normal.  The plan was reviewed with the patient/family, and all questions/concerned were addressed.  It was my pleasure to see Brenn today and participate in her care. Please feel free to contact me with any questions or concerns.  Sincerely,  Wyline Mood, DO Allergy & Immunology  [facility] office: [phone] [facility] office: [phone]

## 2022-03-04 ENCOUNTER — Encounter: Payer: Self-pay | Admitting: Allergy

## 2022-03-04 ENCOUNTER — Ambulatory Visit (INDEPENDENT_AMBULATORY_CARE_PROVIDER_SITE_OTHER): Payer: Medicaid Other | Admitting: Allergy

## 2022-03-04 VITALS — BP 124/80 | HR 80 | Temp 97.6°F | Resp 16 | Ht 65.0 in | Wt 218.0 lb

## 2022-03-04 DIAGNOSIS — T781XXD Other adverse food reactions, not elsewhere classified, subsequent encounter: Secondary | ICD-10-CM

## 2022-03-04 DIAGNOSIS — Z91018 Allergy to other foods: Secondary | ICD-10-CM | POA: Diagnosis not present

## 2022-03-04 DIAGNOSIS — H1013 Acute atopic conjunctivitis, bilateral: Secondary | ICD-10-CM

## 2022-03-04 DIAGNOSIS — R0609 Other forms of dyspnea: Secondary | ICD-10-CM

## 2022-03-04 DIAGNOSIS — J3089 Other allergic rhinitis: Secondary | ICD-10-CM | POA: Diagnosis not present

## 2022-03-04 DIAGNOSIS — R21 Rash and other nonspecific skin eruption: Secondary | ICD-10-CM

## 2022-03-04 MED ORDER — MONTELUKAST SODIUM 10 MG PO TABS: 10.0000 mg | ORAL_TABLET | Freq: Every day | ORAL | 5 refills | Status: DC

## 2022-03-04 MED ORDER — CROMOLYN SODIUM 4 % OP SOLN
1.0000 [drp] | Freq: Four times a day (QID) | OPHTHALMIC | 3 refills | Status: DC | PRN
Start: 2022-03-04 — End: 2023-08-31

## 2022-03-04 NOTE — Assessment & Plan Note (Signed)
Dry patches on elbows.  Continue proper skin care.

## 2022-03-04 NOTE — Patient Instructions (Addendum)
Today's skin testing showed: Positive to grass, weed, ragweed, trees, cat, dog, dust mites. Negative to select foods.   Results given.  Environmental allergies Start environmental control measures as below. Use over the counter antihistamines such as Zyrtec (cetirizine), Claritin (loratadine), Allegra (fexofenadine), or Xyzal (levocetirizine) daily as needed. May take twice a day during allergy flares. May switch antihistamines every few months. Use cromolyn 4% 1 drop in each eye up to four times a day as needed for itchy/watery eyes.  Start Singulair (montelukast) 10mg  daily at night. Cautioned that in some children/adults can experience behavioral changes including hyperactivity, agitation, depression, sleep disturbances and suicidal ideations. These side effects are rare, but if you notice them you should notify me and discontinue Singulair (montelukast). Consider allergy injections for long term control if above medications do not help the symptoms - handout given.   Food: Avoid oreo cookies.  Discussed that her food triggered oral and throat symptoms are likely caused by oral food allergy syndrome (OFAS). This is caused by cross reactivity of pollen with fresh fruits and vegetables, and nuts. Symptoms are usually localized in the form of itching and burning in mouth and throat. Very rarely it can progress to more severe symptoms. Eating foods in cooked or processed forms usually minimizes symptoms. I recommended avoidance of eating the problem foods, especially during the peak season(s). Sometimes, OFAS can induce severe throat swelling or even a systemic reaction; with such instance, I advised them to report to a local ER. A list of common pollens and food cross-reactivities was provided to the patient.   Breathing: May use albuterol rescue inhaler 2 puffs or nebulizer every 4 to 6 hours as needed for shortness of breath, chest tightness, coughing, and wheezing. Monitor frequency of use.   If your symptoms go away after resting a few minutes then it is most likely not due to your lungs.   Follow up in 3 months or sooner if needed.    Reducing Pollen Exposure Pollen seasons: trees (spring), grass (summer) and ragweed/weeds (fall). Keep windows closed in your home and car to lower pollen exposure.  Install air conditioning in the bedroom and throughout the house if possible.  Avoid going out in dry windy days - especially early morning. Pollen counts are highest between 5 - 10 AM and on dry, hot and windy days.  Save outside activities for late afternoon or after a heavy rain, when pollen levels are lower.  Avoid mowing of grass if you have grass pollen allergy. Be aware that pollen can also be transported indoors on people and pets.  Dry your clothes in an automatic dryer rather than hanging them outside where they might collect pollen.  Rinse hair and eyes before bedtime. Pet Allergen Avoidance: Contrary to popular opinion, there are no "hypoallergenic" breeds of dogs or cats. That is because people are not allergic to an animal's hair, but to an allergen found in the animal's saliva, dander (dead skin flakes) or urine. Pet allergy symptoms typically occur within minutes. For some people, symptoms can build up and become most severe 8 to 12 hours after contact with the animal. People with severe allergies can experience reactions in public places if dander has been transported on the pet owners' clothing. Keeping an animal outdoors is only a partial solution, since homes with pets in the yard still have higher concentrations of animal allergens. Before getting a pet, ask your allergist to determine if you are allergic to animals. If your pet is already  considered part of your family, try to minimize contact and keep the pet out of the bedroom and other rooms where you spend a great deal of time. As with dust mites, vacuum carpets often or replace carpet with a hardwood floor, tile  or linoleum. High-efficiency particulate air (HEPA) cleaners can reduce allergen levels over time. While dander and saliva are the source of cat and dog allergens, urine is the source of allergens from rabbits, hamsters, mice and [place] pigs; so ask a non-allergic family member to clean the animal's cage. If you have a pet allergy, talk to your allergist about the potential for allergy immunotherapy (allergy shots). This strategy can often provide long-term relief. Control of House Dust Mite Allergen Dust mite allergens are a common trigger of allergy and asthma symptoms. While they can be found throughout the house, these microscopic creatures thrive in warm, humid environments such as bedding, upholstered furniture and carpeting. Because so much time is spent in the bedroom, it is essential to reduce mite levels there.  Encase pillows, mattresses, and box springs in special allergen-proof fabric covers or airtight, zippered plastic covers.  Bedding should be washed weekly in hot water (130 F) and dried in a hot dryer. Allergen-proof covers are available for comforters and pillows that can't be regularly washed.  Wash the allergy-proof covers every few months. Minimize clutter in the bedroom. Keep pets out of the bedroom.  Keep humidity less than 50% by using a dehumidifier or air conditioning. You can buy a humidity measuring device called a hygrometer to monitor this.  If possible, replace carpets with hardwood, linoleum, or washable area rugs. If that's not possible, vacuum frequently with a vacuum that has a HEPA filter. Remove all upholstered furniture and non-washable window drapes from the bedroom. Remove all non-washable stuffed toys from the bedroom.  Wash stuffed toys weekly.

## 2022-03-04 NOTE — Assessment & Plan Note (Signed)
Noted dyspnea on exertion and using albuterol 4 times per week for this.   Today's spirometry showed some restriction with 5% improvement in FEV1 post bronchodilator treatment. Clinically feeling unchanged.  . May use albuterol rescue inhaler 2 puffs every 4 to 6 hours as needed for shortness of breath, chest tightness, coughing, and wheezing. Monitor frequency of use.  o If your symptoms go away after resting a few minutes then it is most likely not due to your lungs but due to physical deconditioning.

## 2022-03-04 NOTE — Assessment & Plan Note (Signed)
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 Tried OTC antihistamines with some benefit. Dislikes nasal sprays. No prior allergy testing/ENT evaluation.  Today's skin testing showed: Positive to grass, weed, ragweed, trees, cat, dog, dust mites.  Start environmental control measures as below.  Use over the counter antihistamines such as Zyrtec (cetirizine), Claritin (loratadine), Allegra (fexofenadine), or Xyzal (levocetirizine) daily as needed. May take twice a day during allergy flares. May switch antihistamines every few months.  Use cromolyn 4% 1 drop in each eye up to four times a day as needed for itchy/watery eyes.   Start Singulair (montelukast) 10mg  daily at night.  Cautioned that in some children/adults can experience behavioral changes including hyperactivity, agitation, depression, sleep disturbances and suicidal ideations. These side effects are rare, but if you notice them you should notify me and discontinue Singulair (montelukast).  Consider allergy injections for long term control if above medications do not help the symptoms - handout given.

## 2022-03-04 NOTE — Assessment & Plan Note (Signed)
Noted oreos cause burning sensation in the mouth. Can eat wheat, chocolate, some lactose free items with no issues. Eggplant caused whole body pruritus.  Today's skin testing showed: Negative to select foods.  . Avoid oreo cookies and eggplant.  o No skin testing available for eggplant.

## 2022-03-04 NOTE — Assessment & Plan Note (Signed)
Noted perioral discomfort with fresh bananas, cherries, melons.  . Discussed that her food triggered oral and throat symptoms are likely caused by oral food allergy syndrome (OFAS). This is caused by cross reactivity of pollen with fresh fruits and vegetables, and nuts. Symptoms are usually localized in the form of itching and burning in mouth and throat. Very rarely it can progress to more severe symptoms. Eating foods in cooked or processed forms usually minimizes symptoms. I recommended avoidance of eating the problem foods, especially during the peak season(s). Sometimes, OFAS can induce severe throat swelling or even a systemic reaction; with such instance, I advised them to report to a local ER. A list of common pollens and food cross-reactivities was provided to the patient.

## 2022-03-04 NOTE — Assessment & Plan Note (Signed)
.   See assessment and plan as above. 

## 2022-03-06 ENCOUNTER — Encounter (HOSPITAL_COMMUNITY): Payer: Self-pay

## 2022-03-06 ENCOUNTER — Other Ambulatory Visit: Payer: Self-pay

## 2022-03-06 ENCOUNTER — Emergency Department (HOSPITAL_COMMUNITY)
Admission: EM | Admit: 2022-03-06 | Discharge: 2022-03-06 | Discharge status: Against Medical Advice | Payer: Medicaid Other | Attending: Emergency Medicine | Admitting: Emergency Medicine

## 2022-03-06 DIAGNOSIS — Z5321 Procedure and treatment not carried out due to patient leaving prior to being seen by health care provider: Secondary | ICD-10-CM | POA: Diagnosis not present

## 2022-03-06 DIAGNOSIS — R1010 Upper abdominal pain, unspecified: Secondary | ICD-10-CM | POA: Insufficient documentation

## 2022-03-06 DIAGNOSIS — R1110 Vomiting, unspecified: Secondary | ICD-10-CM | POA: Insufficient documentation

## 2022-03-06 DIAGNOSIS — K921 Melena: Secondary | ICD-10-CM | POA: Diagnosis not present

## 2022-03-06 LAB — CBC
HCT: 35.3 % — ABNORMAL LOW (ref 36.0–46.0)
Hemoglobin: 10.6 g/dL — ABNORMAL LOW (ref 12.0–15.0)
MCH: 23.6 pg — ABNORMAL LOW (ref 26.0–34.0)
MCHC: 30 g/dL (ref 30.0–36.0)
MCV: 78.6 fL — ABNORMAL LOW (ref 80.0–100.0)
Platelets: 216 10*3/uL (ref 150–400)
RBC: 4.49 MIL/uL (ref 3.87–5.11)
RDW: 18.2 % — ABNORMAL HIGH (ref 11.5–15.5)
WBC: 4.1 10*3/uL (ref 4.0–10.5)
nRBC: 0 % (ref 0.0–0.2)

## 2022-03-06 LAB — COMPREHENSIVE METABOLIC PANEL
ALT: 23 U/L (ref 0–44)
AST: 20 U/L (ref 15–41)
Albumin: 3.8 g/dL (ref 3.5–5.0)
Alkaline Phosphatase: 46 U/L (ref 38–126)
Anion gap: 5 (ref 5–15)
BUN: 9 mg/dL (ref 6–20)
CO2: 23 mmol/L (ref 22–32)
Calcium: 8.8 mg/dL — ABNORMAL LOW (ref 8.9–10.3)
Chloride: 106 mmol/L (ref 98–111)
Creatinine, Ser: 0.9 mg/dL (ref 0.44–1.00)
GFR, Estimated: >60 mL/min (ref >60)
Glucose, Bld: 109 mg/dL — ABNORMAL HIGH (ref 70–99)
Potassium: 3.8 mmol/L (ref 3.5–5.1)
Sodium: 134 mmol/L — ABNORMAL LOW (ref 135–145)
Total Bilirubin: 0.6 mg/dL (ref 0.3–1.2)
Total Protein: 7.7 g/dL (ref 6.5–8.1)

## 2022-03-06 LAB — I-STAT BETA HCG BLOOD, ED (MC, WL, AP ONLY): I-stat hCG, quantitative: <5 m[IU]/mL (ref <5)

## 2022-03-06 LAB — TYPE AND SCREEN
ABO/RH(D): O NEG
Antibody Screen: NEGATIVE

## 2022-03-06 NOTE — ED Triage Notes (Addendum)
Patient states she began having intermittent mid upper abdominal pain and vomiting a week ago. Patient states she saw bright red and burgundy colored blood in her stool 4 days ago.  Patient states pain worsens after eating or drinking.

## 2022-03-24 ENCOUNTER — Other Ambulatory Visit (HOSPITAL_COMMUNITY)
Admission: RE | Admit: 2022-03-24 | Discharge: 2022-03-24 | Disposition: A | Discharge status: Home / Self Care | Payer: Medicaid Other | Source: Ambulatory Visit | Attending: Obstetrics | Admitting: Obstetrics

## 2022-03-24 ENCOUNTER — Encounter: Payer: Self-pay | Admitting: Obstetrics

## 2022-03-24 ENCOUNTER — Ambulatory Visit (INDEPENDENT_AMBULATORY_CARE_PROVIDER_SITE_OTHER): Payer: Medicaid Other | Admitting: Obstetrics

## 2022-03-24 VITALS — BP 124/86 | HR 95 | Wt 214.0 lb

## 2022-03-24 DIAGNOSIS — Z113 Encounter for screening for infections with a predominantly sexual mode of transmission: Secondary | ICD-10-CM | POA: Insufficient documentation

## 2022-03-24 DIAGNOSIS — Z01419 Encounter for gynecological examination (general) (routine) without abnormal findings: Secondary | ICD-10-CM | POA: Diagnosis present

## 2022-03-24 DIAGNOSIS — E669 Obesity, unspecified: Secondary | ICD-10-CM

## 2022-03-24 DIAGNOSIS — N898 Other specified noninflammatory disorders of vagina: Secondary | ICD-10-CM | POA: Insufficient documentation

## 2022-03-24 DIAGNOSIS — E66811 Obesity, class 1: Secondary | ICD-10-CM

## 2022-03-24 NOTE — Progress Notes (Signed)
Pt presents for annual, pap, and all STD testing.  Negative pap 12/09/20

## 2022-03-24 NOTE — Progress Notes (Signed)
Subjective:        Morgan Chung is a 36 y.o. female here for a routine exam.  Current complaints: Vaginal discharge.    Personal health questionnaire:  Is patient [name], have a family history of breast and/or ovarian cancer: no Is there a family history of uterine cancer diagnosed at age < 60, gastrointestinal cancer, urinary tract cancer, family member who is a Field seismologist syndrome-associated carrier: no Is the patient overweight and hypertensive, family history of diabetes, personal history of gestational diabetes, preeclampsia or PCOS: yes Is patient over 21, have PCOS,  family history of premature CHD under age 42, diabetes, smoke, have hypertension or peripheral artery disease:  no At any time, has a partner hit, kicked or otherwise hurt or frightened you?: no Over the past 2 weeks, have you felt down, depressed or hopeless?: no Over the past 2 weeks, have you felt little interest or pleasure in doing things?:no   Gynecologic History Patient's last menstrual period was 03/09/2022 (exact date). Contraception: none Last Pap: 2022. Results were: normal Last mammogram: n/a. Results were: n/a  Obstetric History OB History  No obstetric history on file.    Past Medical History:  Diagnosis Date   Anemia    Asthma    Crohn's disease ([place])    Gastritis    GERD (gastroesophageal reflux disease)    Hypertension    Insomnia     Past Surgical History:  Procedure Laterality Date   BOWEL RESECTION     CESAREAN SECTION       Current Outpatient Medications:    AMLODIPINE BENZOATE PO, Take 10 mg by mouth., Disp: , Rfl:    HYDROCHLOROTHIAZIDE PO, Take by mouth., Disp: , Rfl:    MERCAPTOPURINE PO, Take by mouth., Disp: , Rfl:    montelukast (SINGULAIR) 10 MG tablet, Take 1 tablet (10 mg total) by mouth at bedtime., Disp: 30 tablet, Rfl: 5   PROAIR HFA 108 (90 Base) MCG/ACT inhaler, Inhale 2 puffs into the lungs every 4 (four) hours as needed for wheezing or shortness of  breath (ex-induced)., Disp: , Rfl:    SYMBICORT 160-4.5 MCG/ACT inhaler, Inhale 1 puff into the lungs 2 (two) times daily., Disp: , Rfl:    zolpidem (AMBIEN) 10 MG tablet, Take 10 mg by mouth daily., Disp: , Rfl:    cromolyn (OPTICROM) 4 % ophthalmic solution, Place 1 drop into both eyes 4 (four) times daily as needed (itchy/watery eyes). (Patient not taking: Reported on 03/24/2022), Disp: 10 mL, Rfl: 3 Allergies  Allergen Reactions   Banana Itching, Swelling and Other (See Comments)    Mouth itches and swells   Other Itching, Swelling and Other (See Comments)    (Fruit) MELON MIX- Mouth itches and swells   Latex Rash    Social History   Tobacco Use   Smoking status: Never   Smokeless tobacco: Never  Substance Use Topics   Alcohol use: Yes    Comment: occasionally    Family History  Problem Relation Age of Onset   Hypertension Mother    Irritable bowel syndrome Mother    Diabetes Father    Hypertension Father    Irritable bowel syndrome Sister    Colon cancer Neg Hx    Pancreatic cancer Neg Hx    Stomach cancer Neg Hx    Esophageal cancer Neg Hx    Liver cancer Neg Hx    Colon polyps Neg Hx       Review of Systems  Constitutional: negative  for fatigue and weight loss Respiratory: negative for cough and wheezing Cardiovascular: negative for chest pain, fatigue and palpitations Gastrointestinal: negative for abdominal pain and change in bowel habits Musculoskeletal:negative for myalgias Neurological: negative for gait problems and tremors Behavioral/Psych: negative for abusive relationship, depression Endocrine: negative for temperature intolerance    Genitourinary:positive for vaginal discharge.  negative for abnormal menstrual periods, genital lesions, hot flashes, sexual problems  Integument/breast: negative for breast lump, breast tenderness, nipple discharge and skin lesion(s)    Objective:       BP 124/86   Pulse 95   Wt 214 lb (97.1 kg)   LMP 03/09/2022  (Exact Date)   BMI 35.61 kg/m  General:   Alert and no distress  Skin:   no rash or abnormalities  Lungs:   clear to auscultation bilaterally  Heart:   regular rate and rhythm, S1, S2 normal, no murmur, click, rub or gallop  Breasts:   normal without suspicious masses, skin or nipple changes or axillary nodes  Abdomen:  normal findings: no organomegaly, soft, non-tender and no hernia  Pelvis:  External genitalia: normal general appearance Urinary system: urethral meatus normal and bladder without fullness, nontender Vaginal: normal without tenderness, induration or masses Cervix: normal appearance Adnexa: normal bimanual exam Uterus: anteverted and non-tender, normal size   Lab Review Urine pregnancy test Labs reviewed yes Radiologic studies reviewed no  I have spent a total of 20 minutes of face-to-face time, excluding clinical staff time, reviewing notes and preparing to see patient, ordering tests and/or medications, and counseling the patient.   Assessment:     1. Encounter for gynecological examination with Papanicolaou smear of cervix Rx: - Cytology - PAP( [facility])  2. Vaginal discharge Rx: - Cervicovaginal ancillary only  3. Screening examination for STD (sexually transmitted disease) Rx: - Hepatitis B surface antigen - Hepatitis C antibody - HIV Antibody (routine testing w rflx) - RPR - Cervicovaginal ancillary only  4. Obesity (BMI 30.0-34.9) Rx; - Referral to Nutrition and Diabetes Services    Plan:    Education reviewed: calcium supplements, depression evaluation, low fat, low cholesterol diet, safe sex/STD prevention, self breast exams, and weight bearing exercise. Contraception: none. Follow up in: 1 year.   No orders of the defined types were placed in this encounter.  Orders Placed This Encounter  Procedures   Hepatitis B surface antigen   Hepatitis C antibody   HIV Antibody (routine testing w rflx)   RPR   Referral to Nutrition and  Diabetes Services    Referral Priority:   Routine    Referral Type:   Consultation    Referral Reason:   Specialty Services Required    Number of Visits Requested:   1     Brock Bad, MD 03/24/2022 11:05 AM

## 2022-03-25 ENCOUNTER — Other Ambulatory Visit: Payer: Self-pay | Admitting: Obstetrics

## 2022-03-25 DIAGNOSIS — B379 Candidiasis, unspecified: Secondary | ICD-10-CM

## 2022-03-25 LAB — CERVICOVAGINAL ANCILLARY ONLY
Bacterial Vaginitis (gardnerella): NEGATIVE
Candida Glabrata: NEGATIVE
Candida Vaginitis: POSITIVE — AB
Chlamydia: NEGATIVE
Comment: NEGATIVE
Comment: NEGATIVE
Comment: NEGATIVE
Comment: NEGATIVE
Comment: NEGATIVE
Comment: NORMAL
Neisseria Gonorrhea: NEGATIVE
Trichomonas: NEGATIVE

## 2022-03-25 LAB — HEPATITIS C ANTIBODY: Hep C Virus Ab: NONREACTIVE

## 2022-03-25 LAB — HIV ANTIBODY (ROUTINE TESTING W REFLEX): HIV Screen 4th Generation wRfx: NONREACTIVE

## 2022-03-25 LAB — HEPATITIS B SURFACE ANTIGEN: Hepatitis B Surface Ag: NEGATIVE

## 2022-03-25 LAB — RPR: RPR Ser Ql: NONREACTIVE

## 2022-03-25 MED ORDER — FLUCONAZOLE 150 MG PO TABS: 150.0000 mg | ORAL_TABLET | Freq: Once | ORAL | 0 refills | Status: AC

## 2022-03-26 ENCOUNTER — Telehealth: Payer: Medicaid Other | Admitting: Physician Assistant

## 2022-03-26 ENCOUNTER — Telehealth: Payer: Self-pay | Admitting: Emergency Medicine

## 2022-03-26 DIAGNOSIS — J208 Acute bronchitis due to other specified organisms: Secondary | ICD-10-CM

## 2022-03-26 DIAGNOSIS — B9689 Other specified bacterial agents as the cause of diseases classified elsewhere: Secondary | ICD-10-CM

## 2022-03-26 LAB — CYTOLOGY - PAP
Comment: NEGATIVE
Diagnosis: NEGATIVE
High risk HPV: NEGATIVE

## 2022-03-26 MED ORDER — DOXYCYCLINE HYCLATE 100 MG PO TABS: 100.0000 mg | ORAL_TABLET | Freq: Two times a day (BID) | ORAL | 0 refills | Status: DC

## 2022-03-26 MED ORDER — BENZONATATE 100 MG PO CAPS: 100.0000 mg | ORAL_CAPSULE | Freq: Three times a day (TID) | ORAL | 0 refills | Status: DC | PRN

## 2022-03-26 NOTE — Patient Instructions (Signed)
Morgan Chung, thank you for joining Morgan Rio, PA-C for today's virtual visit.  While this provider is not your primary care provider (PCP), if your PCP is located in our provider database this encounter information will be shared with them immediately following your visit.   [facility] account gives you access to today's visit and all your visits, tests, and labs performed at [facility] " click here if you don't have a [facility] account or go to [email or web address]  Consent: (Patient) Morgan Chung provided verbal consent for this virtual visit at the beginning of the encounter.  Current Medications:  Current Outpatient Medications:    AMLODIPINE BENZOATE PO, Take 10 mg by mouth., Disp: , Rfl:    cromolyn (OPTICROM) 4 % ophthalmic solution, Place 1 drop into both eyes 4 (four) times daily as needed (itchy/watery eyes). (Patient not taking: Reported on 03/24/2022), Disp: 10 mL, Rfl: 3   HYDROCHLOROTHIAZIDE PO, Take by mouth., Disp: , Rfl:    MERCAPTOPURINE PO, Take by mouth., Disp: , Rfl:    montelukast (SINGULAIR) 10 MG tablet, Take 1 tablet (10 mg total) by mouth at bedtime., Disp: 30 tablet, Rfl: 5   PROAIR HFA 108 (90 Base) MCG/ACT inhaler, Inhale 2 puffs into the lungs every 4 (four) hours as needed for wheezing or shortness of breath (ex-induced)., Disp: , Rfl:    SYMBICORT 160-4.5 MCG/ACT inhaler, Inhale 1 puff into the lungs 2 (two) times daily., Disp: , Rfl:    zolpidem (AMBIEN) 10 MG tablet, Take 10 mg by mouth daily., Disp: , Rfl:    Medications ordered in this encounter:  No orders of the defined types were placed in this encounter.    *If you need refills on other medications prior to your next appointment, please contact your pharmacy*  Follow-Up: Call back or seek an in-person evaluation if the symptoms worsen or if the condition fails to improve as anticipated.  [facility] [phone]  Other  Instructions Take antibiotic (Doxycycline) as directed.  Increase fluids.  Get plenty of rest. Use Mucinex for congestion. Tessalon as directed for cough. Take a daily probiotic (I recommend Align or Culturelle, but even Activia Yogurt may be beneficial).  A humidifier placed in the bedroom may offer some relief for a dry, scratchy throat of nasal irritation.  Read information below on acute bronchitis. Please call or return to clinic if symptoms are not improving.  Acute Bronchitis Bronchitis is when the airways that extend from the windpipe into the lungs get red, puffy, and painful (inflamed). Bronchitis often causes thick spit (mucus) to develop. This leads to a cough. A cough is the most common symptom of bronchitis. In acute bronchitis, the condition usually begins suddenly and goes away over time (usually in 2 weeks). Smoking, allergies, and asthma can make bronchitis worse. Repeated episodes of bronchitis may cause more lung problems.  HOME CARE Rest. Drink enough fluids to keep your pee (urine) clear or pale yellow (unless you need to limit fluids as told by your doctor). Only take over-the-counter or prescription medicines as told by your doctor. Avoid smoking and secondhand smoke. These can make bronchitis worse. If you are a smoker, think about using nicotine gum or skin patches. Quitting smoking will help your lungs heal faster. Reduce the chance of getting bronchitis again by: Washing your hands often. Avoiding people with cold symptoms. Trying not to touch your hands to your mouth, nose, or eyes. Follow up with your doctor as  told.  GET HELP IF: Your symptoms do not improve after 1 week of treatment. Symptoms include: Cough. Fever. Coughing up thick spit. Body aches. Chest congestion. Chills. Shortness of breath. Sore throat.  GET HELP RIGHT AWAY IF:  You have an increased fever. You have chills. You have severe shortness of breath. You have bloody thick spit  (sputum). You throw up (vomit) often. You lose too much body fluid (dehydration). You have a severe headache. You faint.  MAKE SURE YOU:  Understand these instructions. Will watch your condition. Will get help right away if you are not doing well or get worse. Document Released: 10/21/2007 Document Revised: 01/04/2013 Document Reviewed: 10/25/2012 [facility] Patient Information 2015 [place], [place]. This information is not intended to replace advice given to you by your health care provider. Make sure you discuss any questions you have with your health care provider.    If you have been instructed to have an in-person evaluation today at a local Urgent Care facility, please use the link below. It will take you to a list of all of our available [facility], including address, phone number and hours of operation. Please do not delay care.  [facility]  If you or a family member do not have a primary care provider, use the link below to schedule a visit and establish care. When you choose a [facility] primary care physician or advanced practice provider, you gain a long-term partner in health. Find a Primary Care Provider  Learn more about [facility]'s in-office and virtual care options: [facility] - Get Care Now

## 2022-03-26 NOTE — Progress Notes (Signed)
Virtual Visit Consent   Morgan Chung, you are scheduled for a virtual visit with a [facility] provider today. Just as with appointments in the office, your consent must be obtained to participate. Your consent will be active for this visit and any virtual visit you may have with one of our providers in the next 365 days. If you have a MyChart account, a copy of this consent can be sent to you electronically.  As this is a virtual visit, video technology does not allow for your provider to perform a traditional examination. This may limit your provider's ability to fully assess your condition. If your provider identifies any concerns that need to be evaluated in person or the need to arrange testing (such as labs, EKG, etc.), we will make arrangements to do so. Although advances in technology are sophisticated, we cannot ensure that it will always work on either your end or our end. If the connection with a video visit is poor, the visit may have to be switched to a telephone visit. With either a video or telephone visit, we are not always able to ensure that we have a secure connection.  By engaging in this virtual visit, you consent to the provision of healthcare and authorize for your insurance to be billed (if applicable) for the services provided during this visit. Depending on your insurance coverage, you may receive a charge related to this service.  I need to obtain your verbal consent now. Are you willing to proceed with your visit today? Morgan Chung has provided verbal consent on 03/26/2022 for a virtual visit (video or telephone). Morgan Chung, [place]  Date: 03/26/2022 1:34 PM  Virtual Visit via Video Note   I, Morgan Chung, connected with  Morgan Chung  ([id number], [birth year]) on 03/26/22 at  1:30 PM EST by a video-enabled telemedicine application and verified that I am speaking with the correct person using two identifiers.  Location: Patient: Virtual Visit Location Patient:  Home Provider: Virtual Visit Location Provider: Home Office   I discussed the limitations of evaluation and management by telemedicine and the availability of in person appointments. The patient expressed understanding and agreed to proceed.    History of Present Illness: Morgan Chung is a 36 y.o. who identifies as a female who was assigned female at birth, and is being seen today for some ongoing cough and congestion, worsening over the past week. Notes some associated scratchy throat and fatigue. Cough was dry then productive of clear phlegm. Over past week is now yellow. Took home COVID test which was negative. Is taking Mucinex OTC.  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   Rash and other nonspecific skin eruption 03/04/2022   Other adverse food reactions, not elsewhere classified, subsequent encounter 03/04/2022   Bowel obstruction (HCC) 12/26/2020   Constipation 08/23/2020   Nausea and vomiting 08/23/2020   Abdominal pain 08/07/2020   Insomnia 08/07/2020   Microcytic anemia 08/07/2020   Crohn's disease of small intestine with complication (HCC)     Allergies:  Allergies  Allergen Reactions   Banana Itching, Swelling and Other (See Comments)    Mouth itches and swells   Other Itching, Swelling and Other (See Comments)    (Fruit) MELON MIX- Mouth itches and swells   Latex Rash  Medications:  Current Outpatient Medications:    benzonatate (TESSALON) 100 MG capsule, Take 1 capsule (100 mg total) by mouth 3 (three) times daily as needed for cough., Disp: 30 capsule, Rfl: 0   doxycycline (VIBRA-TABS) 100 MG tablet, Take 1 tablet (100 mg total) by mouth 2 (two) times daily., Disp: 14 tablet, Rfl: 0   AMLODIPINE BENZOATE PO, Take 10 mg by mouth., Disp: , Rfl:    cromolyn  (OPTICROM) 4 % ophthalmic solution, Place 1 drop into both eyes 4 (four) times daily as needed (itchy/watery eyes). (Patient not taking: Reported on 03/24/2022), Disp: 10 mL, Rfl: 3   HYDROCHLOROTHIAZIDE PO, Take by mouth., Disp: , Rfl:    MERCAPTOPURINE PO, Take by mouth., Disp: , Rfl:    montelukast (SINGULAIR) 10 MG tablet, Take 1 tablet (10 mg total) by mouth at bedtime., Disp: 30 tablet, Rfl: 5   PROAIR HFA 108 (90 Base) MCG/ACT inhaler, Inhale 2 puffs into the lungs every 4 (four) hours as needed for wheezing or shortness of breath (ex-induced)., Disp: , Rfl:    SYMBICORT 160-4.5 MCG/ACT inhaler, Inhale 1 puff into the lungs 2 (two) times daily., Disp: , Rfl:    zolpidem (AMBIEN) 10 MG tablet, Take 10 mg by mouth daily., Disp: , Rfl:   Observations/Objective: Patient is well-developed, well-nourished in no acute distress.  Resting comfortably at home.  Head is normocephalic, atraumatic.  No labored breathing.  Speech is clear and coherent with logical content.  Patient is alert and oriented at baseline.    Assessment and Plan: 1. Acute bacterial bronchitis - doxycycline (VIBRA-TABS) 100 MG tablet; Take 1 tablet (100 mg total) by mouth 2 (two) times daily.  Dispense: 14 tablet; Refill: 0 - benzonatate (TESSALON) 100 MG capsule; Take 1 capsule (100 mg total) by mouth 3 (three) times daily as needed for cough.  Dispense: 30 capsule; Refill: 0  Rx Doxycycline.  Increase fluids.  Rest.  Saline nasal spray.  Probiotic.  Mucinex as directed.  Humidifier in bedroom. Tessalon per orders. Continue asthma medications.  Call or return to clinic if symptoms are not improving.   Follow Up Instructions: I discussed the assessment and treatment plan with the patient. The patient was provided an opportunity to ask questions and all were answered. The patient agreed with the plan and demonstrated an understanding of the instructions.  A copy of instructions were sent to the patient via MyChart unless  otherwise noted below.   The patient was advised to call back or seek an in-person evaluation if the symptoms worsen or if the condition fails to improve as anticipated.  Time:  I spent 10 minutes with the patient via telehealth technology discussing the above problems/concerns.    Morgan Climes, PA-C

## 2022-03-26 NOTE — Telephone Encounter (Signed)
TC to patient to discuss results and Rx. 

## 2022-04-14 ENCOUNTER — Encounter: Payer: Medicaid Other | Attending: Obstetrics | Admitting: Skilled Nursing Facility1

## 2022-04-14 VITALS — Ht 64.0 in | Wt 217.8 lb

## 2022-04-14 DIAGNOSIS — E669 Obesity, unspecified: Secondary | ICD-10-CM | POA: Diagnosis not present

## 2022-04-14 NOTE — Progress Notes (Signed)
Medical Nutrition Therapy  Appointment Start time:  4:41 pm  Appointment End time:  5:36 pm  Primary concerns today: weight gain in ~10 months (55 lbs.) after Crohn's dx Referral diagnosis: 66.9 Preferred learning style: no preference indicated Learning readiness: Ready.   NUTRITION ASSESSMENT   Clinical Medical Hx: Crohn's, HTN Medications: See list. Labs: hemoglobin 10.6 (low), HCT 35.3 (low), MCV 78.6 (low), MCH 23.6 (low), RDW 18.2 (high), sodium 134 (low), glucose 109 (high), calcium 8.8 (low) Notable Signs/Symptoms: Crohn's symptoms  Lifestyle & Dietary Hx Pt stated she was dx with Crohn's a little over a year ago and has since then gained 55 lbs. Pt states she had a recent colonoscopy that came back clear.  Pt states she has recently started to go to the gym 2x a week and walking 3 miles a week. Pt states she is currently looking for work and has recently graduated as a Teacher, adult education. Pt states she doesn't have much energy throughout her day (possibly due to low iron). Pt states she often sweats more than she used to. Pt states that 200 lbs. is her new normal because of her restriction with foods from Crohn's. Pt states her eyesight has been changing lately and has been having to wear her glasses more often (possible Vitamin A deficiency) Pt states that dairy, green vegetables (large amounts), pistachios, cashews, and melons often give her Crohn's symptoms.  Pt states her day consists of lots of errands for her mother and appointments for her son.  Pt states she eats out at lunch on occasion. Pt states she used to have soup, salad, or a pasta salad for lunch.  Pt states she has time on Sunday's and Wednesday's to make her meals for lunch.   Estimated daily fluid intake: 32 oz Supplements: None. Sleep: ~5 hours/night  Stress / self-care: time to self (stress level around 5 out of 10) Current average weekly physical activity: 1 hour 2x/week and walking   24-Hr Dietary  Recall First Meal: scrambled or fried egg + [place] bacon (sometimes a waffle) Snack: None reported. Second Meal: None reported.  Snack: Cheese Its or fruit  Third Meal: fish, chicken, and rarely red meat + white rice  Snack: Chips + peppermint tea 10 oz. Beverages: peppermint tea + water + ocean spray + 2 large cups coffee (regular creamer)   NUTRITION DIAGNOSIS  {CHL AMB NUTRITIONAL DIAGNOSIS:[phone]}   NUTRITION INTERVENTION  Nutrition education (E-1) on the following topics:  Encouraged pt to speak with doctor about anemia and other possible nutrient deficiencies.  Discussed ideas for more balanced meal and snack options. Educated pt on the importance of eating throughout the day and how it effects weight loss.   Handouts Provided Include  None.  Learning Style & Readiness for Change Teaching method utilized: Visual & Auditory  Demonstrated degree of understanding via: Teach Back  Barriers to learning/adherence to lifestyle change: ***  Goals Established by Pt Adding a lunch back into pt's daily routine.  Trying out new snack ideas (fruit + nuts, hummus + crackers) Increase water intake to 64 oz/day Doing skim milk and Stevia with coffee instead of regular creamer.    MONITORING & EVALUATION Dietary intake, weekly physical activity.  Next Steps  Patient is to follow-up in 6 weeks.  Body Composition Scale 04/14/22  Current Body Weight 217.8 lb.  Total Body Fat % 40.9%  Visceral Fat 11  Fat-Free Mass % 59%   Total Body Water % 44%  Muscle-Mass lbs 32.4  BMI 37.3  Body Fat Displacement          Torso  lbs 55.2         Left Leg  lbs 11.0         Right Leg  lbs 11.0         Left Arm  lbs 5.5         Right Arm   lbs 5.5

## 2022-05-27 ENCOUNTER — Ambulatory Visit: Payer: Medicaid Other | Admitting: Skilled Nursing Facility1

## 2022-06-22 ENCOUNTER — Telehealth: Payer: Medicaid Other | Admitting: Physician Assistant

## 2022-06-22 DIAGNOSIS — B3731 Acute candidiasis of vulva and vagina: Secondary | ICD-10-CM | POA: Diagnosis not present

## 2022-06-22 MED ORDER — FLUCONAZOLE 150 MG PO TABS: 150.0000 mg | ORAL_TABLET | ORAL | 0 refills | Status: DC | PRN

## 2022-06-22 NOTE — Progress Notes (Signed)
E-Visit for Vaginal Symptoms  We are sorry that you are not feeling well. Here is how we plan to help! Based on what you shared with me it looks like you: May have a yeast vaginosis  Vaginosis is an inflammation of the vagina that can result in discharge, itching and pain. The cause is usually a change in the normal balance of vaginal bacteria or an infection. Vaginosis can also result from reduced estrogen levels after menopause.  The most common causes of vaginosis are:   Bacterial vaginosis which results from an overgrowth of one on several organisms that are normally present in your vagina.   Yeast infections which are caused by a naturally occurring fungus called candida.   Vaginal atrophy (atrophic vaginosis) which results from the thinning of the vagina from reduced estrogen levels after menopause.   Trichomoniasis which is caused by a parasite and is commonly transmitted by sexual intercourse.  Factors that increase your risk of developing vaginosis include: Medications, such as antibiotics and steroids Uncontrolled diabetes Use of hygiene products such as bubble bath, vaginal spray or vaginal deodorant Douching Wearing damp or tight-fitting clothing Using an intrauterine device (IUD) for birth control Hormonal changes, such as those associated with pregnancy, birth control pills or menopause Sexual activity Having a sexually transmitted infection  Your treatment plan is Diflucan (fluconazole) 150mg tablet once, may repeat in 72 hours if needed.  I have electronically sent this prescription into the pharmacy that you have chosen.  Be sure to take all of the medication as directed. Stop taking any medication if you develop a rash, tongue swelling or shortness of breath. Mothers who are breast feeding should consider pumping and discarding their breast milk while on these antibiotics. However, there is no consensus that infant exposure at these doses would be harmful.  Remember  that medication creams can weaken latex condoms. .   HOME CARE:  Good hygiene may prevent some types of vaginosis from recurring and may relieve some symptoms:  Avoid baths, hot tubs and whirlpool spas. Rinse soap from your outer genital area after a shower, and dry the area well to prevent irritation. Don't use scented or harsh soaps, such as those with deodorant or antibacterial action. Avoid irritants. These include scented tampons and pads. Wipe from front to back after using the toilet. Doing so avoids spreading fecal bacteria to your vagina.  Other things that may help prevent vaginosis include:  Don't douche. Your vagina doesn't require cleansing other than normal bathing. Repetitive douching disrupts the normal organisms that reside in the vagina and can actually increase your risk of vaginal infection. Douching won't clear up a vaginal infection. Use a latex condom. Both female and female latex condoms may help you avoid infections spread by sexual contact. Wear cotton underwear. Also wear pantyhose with a cotton crotch. If you feel comfortable without it, skip wearing underwear to bed. Yeast thrives in moist environments Your symptoms should improve in the next day or two.  GET HELP RIGHT AWAY IF:  You have pain in your lower abdomen ( pelvic area or over your ovaries) You develop nausea or vomiting You develop a fever Your discharge changes or worsens You have persistent pain with intercourse You develop shortness of breath, a rapid pulse, or you faint.  These symptoms could be signs of problems or infections that need to be evaluated by a medical provider now.  MAKE SURE YOU   Understand these instructions. Will watch your condition. Will get help right   away if you are not doing well or get worse.  Thank you for choosing an e-visit.  Your e-visit answers were reviewed by a board certified advanced clinical practitioner to complete your personal care plan. Depending  upon the condition, your plan could have included both over the counter or prescription medications.  Please review your pharmacy choice. Make sure the pharmacy is open so you can pick up prescription now. If there is a problem, you may contact your provider through MyChart messaging and have the prescription routed to another pharmacy.  Your safety is important to us. If you have drug allergies check your prescription carefully.   For the next 24 hours you can use MyChart to ask questions about today's visit, request a non-urgent call back, or ask for a work or school excuse. You will get an email in the next two days asking about your experience. I hope that your e-visit has been valuable and will speed your recovery.  I have spent 5 minutes in review of e-visit questionnaire, review and updating patient chart, medical decision making and response to patient.   Dorella Laster M Chandra Feger, PA-C  

## 2022-06-23 ENCOUNTER — Telehealth: Payer: Medicaid Other

## 2022-07-26 IMAGING — DX DG CHEST 1V PORT
1 series · 1 of 1 positions shown · non-contrast
Comparison: Radiograph 08/07/2020, CT 12/29/2020

CLINICAL DATA: Left-sided chest pain radiating to left arm.

EXAM:
PORTABLE CHEST 1 VIEW

[chest ap]
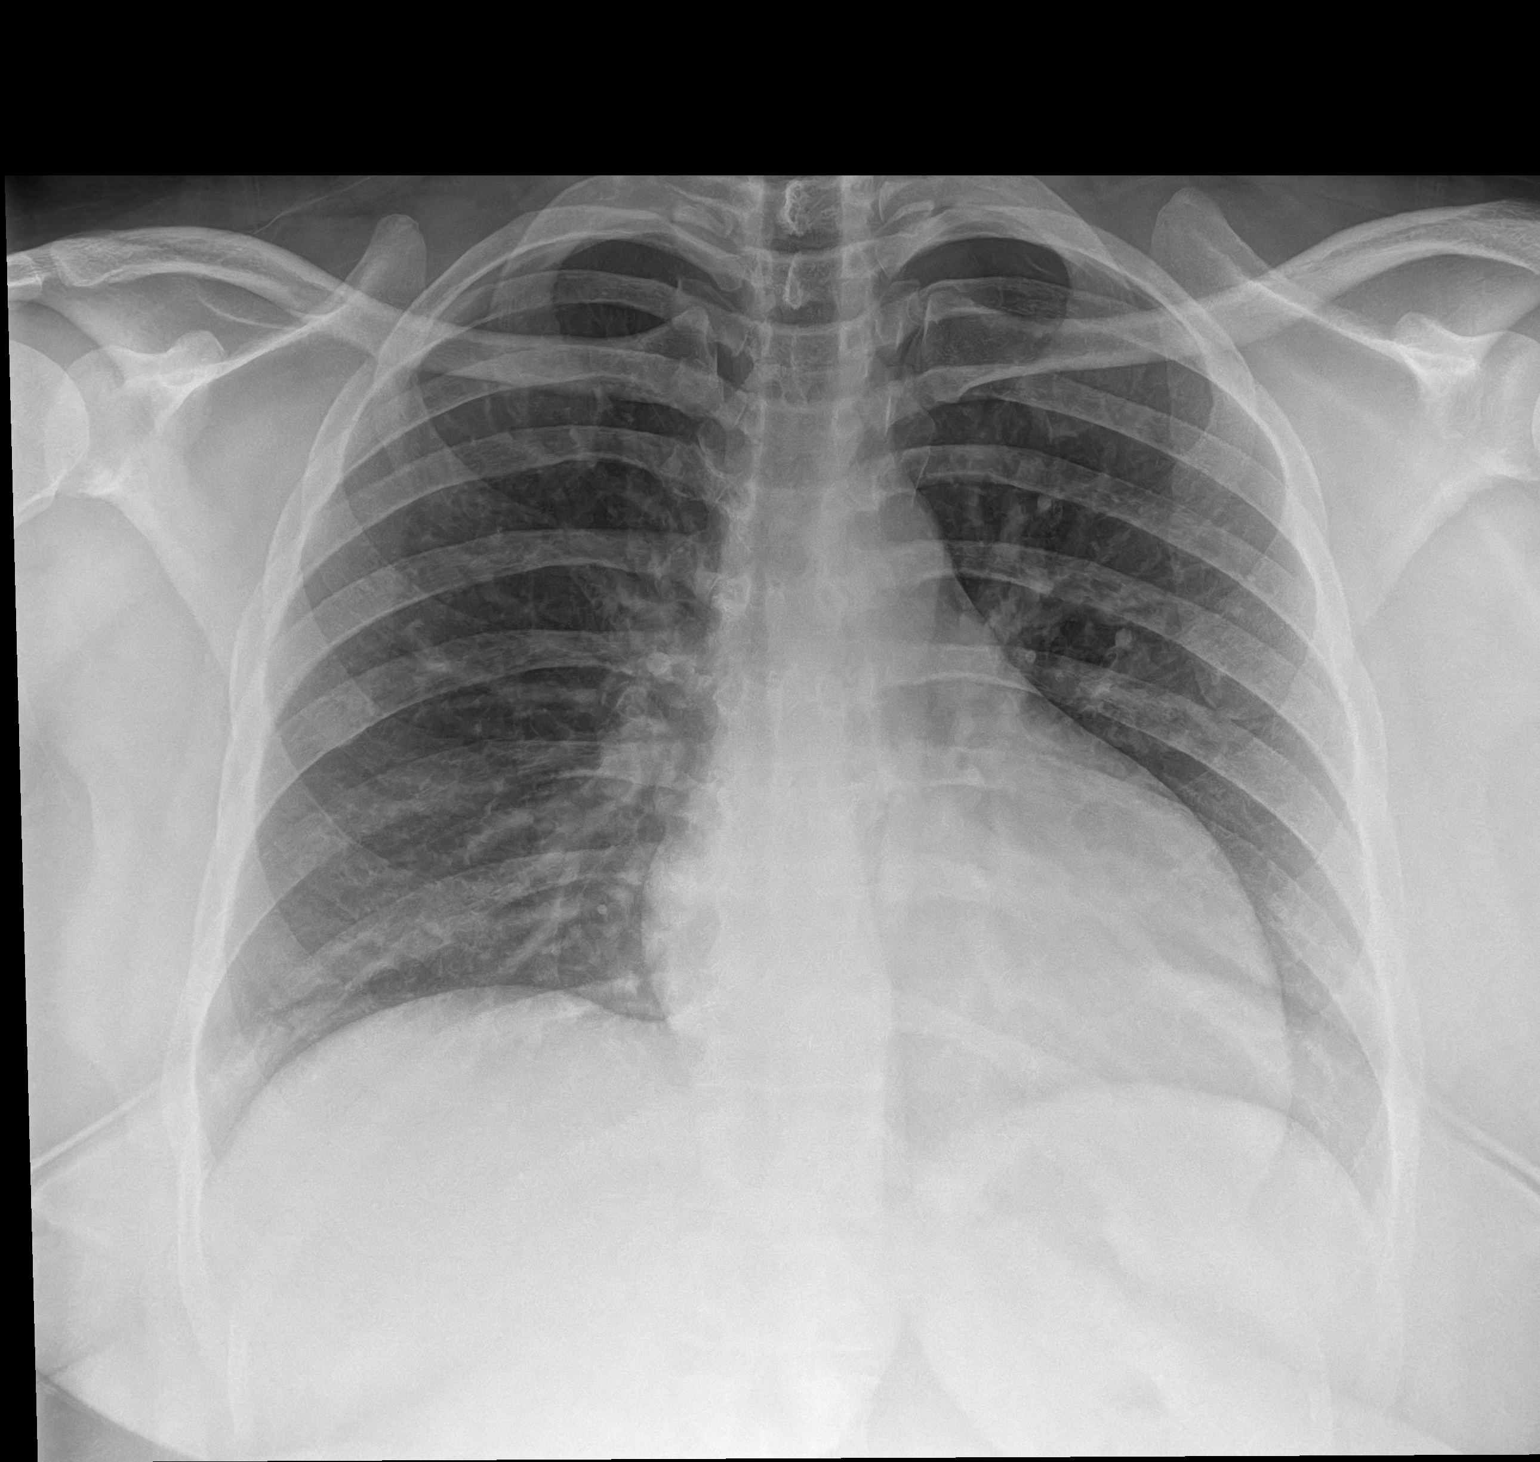

[1 of 1 positions shown; findings below may reference images not displayed]

FINDINGS: Upper normal heart size, likely accentuated by portable
technique.The cardiomediastinal contours are normal. The lungs are
clear. Pulmonary vasculature is normal. No consolidation, pleural
effusion, or pneumothorax. No acute osseous abnormalities are seen.
IMPRESSION: No acute chest findings.

## 2022-10-12 ENCOUNTER — Telehealth: Payer: Medicaid Other | Admitting: Physician Assistant

## 2022-10-12 DIAGNOSIS — M778 Other enthesopathies, not elsewhere classified: Secondary | ICD-10-CM

## 2022-10-12 MED ORDER — PREDNISONE 10 MG (21) PO TBPK: ORAL_TABLET | ORAL | 0 refills | Status: DC

## 2022-10-12 NOTE — Patient Instructions (Signed)
Morgan Chung, thank you for joining Margaretann Loveless, PA-C for today's virtual visit.  While this provider is not your primary care provider (PCP), if your PCP is located in our provider database this encounter information will be shared with them immediately following your visit.   A [facility] MyChart account gives you access to today's visit and all your visits, tests, and labs performed at [facility] " click here if you don't have a [facility] MyChart account or go to [email or web address]  Consent: (Patient) Morgan Chung provided verbal consent for this virtual visit at the beginning of the encounter.  Current Medications:  Current Outpatient Medications:    predniSONE (STERAPRED UNI-PAK 21 TAB) 10 MG (21) TBPK tablet, 6 day taper; take as directed on package instructions, Disp: 21 tablet, Rfl: 0   AMLODIPINE BENZOATE PO, Take 10 mg by mouth., Disp: , Rfl:    benzonatate (TESSALON) 100 MG capsule, Take 1 capsule (100 mg total) by mouth 3 (three) times daily as needed for cough., Disp: 30 capsule, Rfl: 0   cromolyn (OPTICROM) 4 % ophthalmic solution, Place 1 drop into both eyes 4 (four) times daily as needed (itchy/watery eyes). (Patient not taking: Reported on 03/24/2022), Disp: 10 mL, Rfl: 3   HYDROCHLOROTHIAZIDE PO, Take by mouth., Disp: , Rfl:    MERCAPTOPURINE PO, Take by mouth., Disp: , Rfl:    montelukast (SINGULAIR) 10 MG tablet, Take 1 tablet (10 mg total) by mouth at bedtime., Disp: 30 tablet, Rfl: 5   PROAIR HFA 108 (90 Base) MCG/ACT inhaler, Inhale 2 puffs into the lungs every 4 (four) hours as needed for wheezing or shortness of breath (ex-induced)., Disp: , Rfl:    SYMBICORT 160-4.5 MCG/ACT inhaler, Inhale 1 puff into the lungs 2 (two) times daily., Disp: , Rfl:    zolpidem (AMBIEN) 10 MG tablet, Take 10 mg by mouth daily., Disp: , Rfl:    Medications ordered in this encounter:  Meds ordered this encounter  Medications   predniSONE (STERAPRED UNI-PAK 21  TAB) 10 MG (21) TBPK tablet    Sig: 6 day taper; take as directed on package instructions    Dispense:  21 tablet    Refill:  0    Order Specific Question:   Supervising Provider    Answer:   Merrilee Jansky [id number]     *If you need refills on other medications prior to your next appointment, please contact your pharmacy*  Follow-Up: Call back or seek an in-person evaluation if the symptoms worsen or if the condition fails to improve as anticipated.  [facility] [phone]  Other Instructions  Extensor Carpi Ulnaris Tendinitis Tendinitis is inflammation in the tissues that connect muscles to bone (tendons). The extensor carpi ulnaris (ECU) tendon is located on the back of the wrist, on the side that is closest to the small finger. ECU tendinitis will cause pain on the small finger side of the wrist or forearm. It is a common sports injury. What are the causes? This condition may be caused by: Putting repeated stress on your wrist. Using the wrong technique while playing sports like golf or tennis. Weakening of the tendon due to age or a health problem. Injury or trauma. What increases the risk? You are more likely to develop this condition if: You play sports that involve repeated motions of the wrist, such as golf, tennis, or rugby. You are 67 years of age or older. You have an inflammatory condition such as rheumatoid arthritis.  What are the signs or symptoms? Symptoms of this condition include: Pain along the small finger side of your wrist or forearm when moving your wrist. A constant ache on the small finger side of your wrist. Feeling like your grip is weaker than usual. A feeling like a pop, snap, or tear in your wrist. Wrist swelling. How is this diagnosed? This condition may be diagnosed based on: Your symptoms and medical history. A physical exam. During the exam, your health care provider will check the strength of your grip and may ask you to  move your wrist. You may also have an MRI or ultrasound test to check for tears in your ligaments, muscles, or tendons. How is this treated? Treatment for this condition may include: Resting your wrist. Wearing a splint on your wrist. Medicines to help with pain and swelling. Applying heat or ice to the area to ease pain. Exercises to help you maintain strength and range of motion in your wrist (physical therapy). Therapy to help with everyday activities (occupational therapy). If these treatments do not help, you may need an injection of an anti-inflammatory medicine (steroid) mixed with a numbing medicine (local anesthetic). Follow these instructions at home: If you have a removable splint:  Wear the splint as told by your provider. Remove it only as told by your provider. Check the skin around the splint every day. Tell your provider about any concerns. Loosen the splint if your fingers tingle, become numb, or turn cold and blue. Keep the splint clean. If the splint is not waterproof: Do not let it get wet. Cover it with a watertight covering when you take a bath or shower. Remove the splint for bathing and washing your hand only if your provider tells you to. Managing pain, stiffness, and swelling     If told, put ice on the injured area. If you have a removable splint, remove it as told by your provider. Put ice in a plastic bag. Place a towel between your skin and the bag. Leave the ice on for 20 minutes, 2-3 times a day. If told, apply heat to the affected area as often as told by your provider. Use the heat source that your provider recommends, such as a moist heat pack or a heating pad. If you have a removable splint, remove it as told by your provider. Place a towel between your skin and the heat source. Leave the heat on for 15-20 minutes. If your skin turns bright red, remove the ice or heat right away to prevent skin damage. The risk of damage is higher if you cannot  feel pain, heat, or cold. Move your fingers often to reduce stiffness and swelling. Raise (elevate) the injured area above the level of your heart while you are sitting or lying down. Activity Avoid activities that cause pain or put strain on your wrist for as long as told by your provider. Do not use your injured hand to support your body weight until your provider says that you can. Return to your normal activities as told by your provider. Ask your provider what activities are safe for you. Do exercises as told by your provider. Ask your provider when it is safe to drive if you have a splint on your wrist. General instructions Take over-the-counter and prescription medicines only as told by your provider. Do not use any products that contain nicotine or tobacco. These products include cigarettes, chewing tobacco, and vaping devices, such as e-cigarettes. These can delay healing.  If you need help quitting, ask your provider. How is this prevented? Warm up and stretch before being active. Cool down and stretch after being active. Give your body time to rest between periods of activity. Use equipment that fits you and is in good condition. If you play golf or racket sports, focus on proper form or try to improve your technique. Exercise to keep your body healthy. Work on your strength, flexibility, and endurance. Contact a health care provider if: Your pain, tenderness, or swelling gets worse. Get help right away if: Your pain becomes severe. You cannot move your wrist. This information is not intended to replace advice given to you by your health care provider. Make sure you discuss any questions you have with your health care provider. Document Revised: 02/13/2022 Document Reviewed: 02/13/2022 Elsevier Patient Education  2024 Elsevier Inc.    If you have been instructed to have an in-person evaluation today at a local Urgent Care facility, please use the link below. It will take you  to a list of all of our available [facility], including address, phone number and hours of operation. Please do not delay care.  [facility]  If you or a family member do not have a primary care provider, use the link below to schedule a visit and establish care. When you choose a [facility] primary care physician or advanced practice provider, you gain a long-term partner in health. Find a Primary Care Provider  Learn more about [facility] in-office and virtual care options: [facility] - Get Care Now

## 2022-10-12 NOTE — Progress Notes (Signed)
Virtual Visit Consent   Nineveh Oldenkamp, you are scheduled for a virtual visit with a [facility] provider today. Just as with appointments in the office, your consent must be obtained to participate. Your consent will be active for this visit and any virtual visit you may have with one of our providers in the next 365 days. If you have a MyChart account, a copy of this consent can be sent to you electronically.  As this is a virtual visit, video technology does not allow for your provider to perform a traditional examination. This may limit your provider's ability to fully assess your condition. If your provider identifies any concerns that need to be evaluated in person or the need to arrange testing (such as labs, EKG, etc.), we will make arrangements to do so. Although advances in technology are sophisticated, we cannot ensure that it will always work on either your end or our end. If the connection with a video visit is poor, the visit may have to be switched to a telephone visit. With either a video or telephone visit, we are not always able to ensure that we have a secure connection.  By engaging in this virtual visit, you consent to the provision of healthcare and authorize for your insurance to be billed (if applicable) for the services provided during this visit. Depending on your insurance coverage, you may receive a charge related to this service.  I need to obtain your verbal consent now. Are you willing to proceed with your visit today? Yasmen Gallick has provided verbal consent on 10/12/2022 for a virtual visit (video or telephone). Margaretann Loveless, PA-C  Date: 10/12/2022 10:32 AM  Virtual Visit via Video Note   I, Margaretann Loveless, connected with  Taleeya Hamlyn  ([id number], [birth year]) on 10/12/22 at 10:15 AM EDT by a video-enabled telemedicine application and verified that I am speaking with the correct person using two identifiers.  Location: Patient: Virtual Visit Location Patient:  Home Provider: Virtual Visit Location Provider: Home Office   I discussed the limitations of evaluation and management by telemedicine and the availability of in person appointments. The patient expressed understanding and agreed to proceed.    History of Present Illness: Marylisa Kalivas is a 37 y.o. who identifies as a female who was assigned female at birth, and is being seen today for left wrist pain.  HPI: Wrist Pain  The pain is present in the left wrist and left elbow. This is a new problem. The current episode started yesterday. There has been no history of extremity trauma. The problem occurs constantly. The problem has been unchanged. The quality of the pain is described as aching and sharp. The pain is moderate. Associated symptoms include an inability to bear weight and a limited range of motion. Pertinent negatives include no joint locking, joint swelling, numbness, stiffness or tingling. The symptoms are aggravated by activity. She has tried acetaminophen, cold and heat (self massage) for the symptoms. The treatment provided no relief.    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 Felt sharp pain over the left ulnar head area of the wrist that radiated to the dorsolateral aspect of the left elbow. Occasionally, pain radiates all the way to the left posterior upper arm just distal to the shoulder joint. Pain is aggravated with wrist extension and ulnar deviation.   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   Rash and other nonspecific skin eruption 03/04/2022   Other adverse food reactions, not elsewhere classified, subsequent encounter 03/04/2022   Bowel obstruction (HCC) 12/26/2020   Constipation 08/23/2020    Nausea and vomiting 08/23/2020   Abdominal pain 08/07/2020   Insomnia 08/07/2020   Microcytic anemia 08/07/2020   Crohn's disease of small intestine with complication (HCC)     Allergies:  Allergies  Allergen Reactions   Banana Itching, Swelling and Other (See Comments)    Mouth itches and swells   Other Itching, Swelling and Other (See Comments)    (Fruit) MELON MIX- Mouth itches and swells   Latex Rash   Medications:  Current Outpatient Medications:    predniSONE (STERAPRED UNI-PAK 21 TAB) 10 MG (21) TBPK tablet, 6 day taper; take as directed on package instructions, Disp: 21 tablet, Rfl: 0   AMLODIPINE BENZOATE PO, Take 10 mg by mouth., Disp: , Rfl:    benzonatate (TESSALON) 100 MG capsule, Take 1 capsule (100 mg total) by mouth 3 (three) times daily as needed for cough., Disp: 30 capsule, Rfl: 0   cromolyn (OPTICROM) 4 % ophthalmic solution, Place 1 drop into both eyes 4 (four) times daily as needed (itchy/watery eyes). (Patient not taking: Reported on 03/24/2022), Disp: 10 mL, Rfl: 3   HYDROCHLOROTHIAZIDE PO, Take by mouth., Disp: , Rfl:    MERCAPTOPURINE PO, Take by mouth., Disp: , Rfl:    montelukast (SINGULAIR) 10 MG tablet, Take 1 tablet (10 mg total) by mouth at bedtime., Disp: 30 tablet, Rfl: 5   PROAIR HFA 108 (90 Base) MCG/ACT inhaler, Inhale 2 puffs into the lungs every 4 (four) hours as needed for wheezing or shortness of breath (ex-induced)., Disp: , Rfl:    SYMBICORT 160-4.5 MCG/ACT inhaler, Inhale 1 puff into the lungs 2 (two) times daily., Disp: , Rfl:    zolpidem (AMBIEN) 10 MG tablet, Take 10 mg by mouth daily., Disp: , Rfl:   Observations/Objective: Patient is well-developed, well-nourished in no acute distress.  Resting comfortably at home.  Head is normocephalic, atraumatic.  No labored breathing.  Speech is clear and coherent with logical content.  Patient is alert and oriented at baseline.    Assessment and Plan: 1. Left wrist tendinitis - predniSONE  (STERAPRED UNI-PAK 21 TAB) 10 MG (21) TBPK tablet; 6 day taper; take as directed on package instructions  Dispense: 21 tablet; Refill: 0  - Prednisone for possible tendinitis of either the extensor carpi ulnaris or the extensor digiti minimi muscles of the forearm - May continue tylenol - Heat and Ice (heat first, ice last) - Self massage - Cock-up wrist splint - Rest - Discussed follow up in person with [facility] if not improving or if symptoms worsen.  Follow Up Instructions: I discussed the assessment and treatment plan with the patient. The patient was provided an opportunity to ask questions and all were answered. The patient agreed with the plan and demonstrated an understanding of the instructions.  A copy of instructions were sent to the patient via MyChart unless otherwise noted below.     The patient was advised to call back or seek an in-person evaluation if the symptoms worsen or if the condition fails to improve as anticipated.  Time:  I spent 12 minutes with the patient via telehealth technology discussing the above problems/concerns.  Margaretann Loveless, PA-C

## 2023-01-12 ENCOUNTER — Emergency Department (HOSPITAL_BASED_OUTPATIENT_CLINIC_OR_DEPARTMENT_OTHER): Payer: Medicaid Other

## 2023-01-12 ENCOUNTER — Emergency Department (HOSPITAL_BASED_OUTPATIENT_CLINIC_OR_DEPARTMENT_OTHER)
Admission: EM | Admit: 2023-01-12 | Discharge: 2023-01-12 | Disposition: A | Discharge status: Home / Self Care | Payer: Medicaid Other | Attending: Emergency Medicine | Admitting: Emergency Medicine

## 2023-01-12 ENCOUNTER — Other Ambulatory Visit (HOSPITAL_BASED_OUTPATIENT_CLINIC_OR_DEPARTMENT_OTHER): Payer: Self-pay

## 2023-01-12 ENCOUNTER — Other Ambulatory Visit: Payer: Self-pay

## 2023-01-12 ENCOUNTER — Encounter (HOSPITAL_BASED_OUTPATIENT_CLINIC_OR_DEPARTMENT_OTHER): Payer: Self-pay | Admitting: Emergency Medicine

## 2023-01-12 DIAGNOSIS — J45909 Unspecified asthma, uncomplicated: Secondary | ICD-10-CM | POA: Insufficient documentation

## 2023-01-12 DIAGNOSIS — Z91040 Latex allergy status: Secondary | ICD-10-CM | POA: Insufficient documentation

## 2023-01-12 DIAGNOSIS — Z79899 Other long term (current) drug therapy: Secondary | ICD-10-CM | POA: Insufficient documentation

## 2023-01-12 DIAGNOSIS — I10 Essential (primary) hypertension: Secondary | ICD-10-CM | POA: Diagnosis not present

## 2023-01-12 DIAGNOSIS — R1013 Epigastric pain: Secondary | ICD-10-CM | POA: Diagnosis present

## 2023-01-12 LAB — CBC WITH DIFFERENTIAL/PLATELET
Abs Immature Granulocytes: 0.02 10*3/uL (ref 0.00–0.07)
Basophils Absolute: 0 10*3/uL (ref 0.0–0.1)
Basophils Relative: 0 %
Eosinophils Absolute: 0 10*3/uL (ref 0.0–0.5)
Eosinophils Relative: 1 %
HCT: 35.1 % — ABNORMAL LOW (ref 36.0–46.0)
Hemoglobin: 10.5 g/dL — ABNORMAL LOW (ref 12.0–15.0)
Immature Granulocytes: 1 %
Lymphocytes Relative: 24 %
Lymphs Abs: 0.9 10*3/uL (ref 0.7–4.0)
MCH: 24.6 pg — ABNORMAL LOW (ref 26.0–34.0)
MCHC: 29.9 g/dL — ABNORMAL LOW (ref 30.0–36.0)
MCV: 82.4 fL (ref 80.0–100.0)
Monocytes Absolute: 0.2 10*3/uL (ref 0.1–1.0)
Monocytes Relative: 6 %
Neutro Abs: 2.7 10*3/uL (ref 1.7–7.7)
Neutrophils Relative %: 68 %
Platelets: 196 10*3/uL (ref 150–400)
RBC: 4.26 MIL/uL (ref 3.87–5.11)
RDW: 15.4 % (ref 11.5–15.5)
WBC: 3.9 10*3/uL — ABNORMAL LOW (ref 4.0–10.5)
nRBC: 0 % (ref 0.0–0.2)

## 2023-01-12 LAB — BASIC METABOLIC PANEL
Anion gap: 7 (ref 5–15)
BUN: 10 mg/dL (ref 6–20)
CO2: 22 mmol/L (ref 22–32)
Calcium: 8.5 mg/dL — ABNORMAL LOW (ref 8.9–10.3)
Chloride: 105 mmol/L (ref 98–111)
Creatinine, Ser: 0.82 mg/dL (ref 0.44–1.00)
GFR, Estimated: >60 mL/min (ref >60)
Glucose, Bld: 141 mg/dL — ABNORMAL HIGH (ref 70–99)
Potassium: 3.9 mmol/L (ref 3.5–5.1)
Sodium: 134 mmol/L — ABNORMAL LOW (ref 135–145)

## 2023-01-12 LAB — COMPREHENSIVE METABOLIC PANEL
ALT: 9 U/L (ref 0–44)
AST: 8 U/L — ABNORMAL LOW (ref 15–41)
Albumin: 2 g/dL — ABNORMAL LOW (ref 3.5–5.0)
Alkaline Phosphatase: 15 U/L — ABNORMAL LOW (ref 38–126)
Anion gap: 6 (ref 5–15)
BUN: 6 mg/dL (ref 6–20)
CO2: 12 mmol/L — ABNORMAL LOW (ref 22–32)
Calcium: 4.1 mg/dL — CL (ref 8.9–10.3)
Chloride: 129 mmol/L — ABNORMAL HIGH (ref 98–111)
Creatinine, Ser: 0.3 mg/dL — ABNORMAL LOW (ref 0.44–1.00)
GFR, Estimated: >60 mL/min (ref >60)
Glucose, Bld: 52 mg/dL — ABNORMAL LOW (ref 70–99)
Potassium: 1.8 mmol/L — CL (ref 3.5–5.1)
Sodium: 147 mmol/L — ABNORMAL HIGH (ref 135–145)
Total Bilirubin: 0.3 mg/dL (ref 0.3–1.2)
Total Protein: 3.2 g/dL — ABNORMAL LOW (ref 6.5–8.1)

## 2023-01-12 LAB — URINALYSIS, ROUTINE W REFLEX MICROSCOPIC
Bilirubin Urine: NEGATIVE
Glucose, UA: NEGATIVE mg/dL
Hgb urine dipstick: NEGATIVE
Ketones, ur: NEGATIVE mg/dL
Leukocytes,Ua: NEGATIVE
Nitrite: NEGATIVE
Protein, ur: NEGATIVE mg/dL
Specific Gravity, Urine: 1.019 (ref 1.005–1.030)
pH: 6.5 (ref 5.0–8.0)

## 2023-01-12 LAB — PREGNANCY, URINE: Preg Test, Ur: NEGATIVE

## 2023-01-12 LAB — LIPASE, BLOOD: Lipase: 5 U/L — ABNORMAL LOW (ref 11–51)

## 2023-01-12 MED ORDER — HYDROMORPHONE HCL 1 MG/ML IJ SOLN
1.0000 mg | Freq: Once | INTRAMUSCULAR | Status: AC
  Administered 2023-01-12: 1 mg via INTRAVENOUS
  Filled 2023-01-12: qty 1

## 2023-01-12 MED ORDER — SODIUM CHLORIDE 0.9 % IV BOLUS
1000.0000 mL | Freq: Once | INTRAVENOUS | Status: AC
  Administered 2023-01-12: 1000 mL via INTRAVENOUS

## 2023-01-12 MED ORDER — ONDANSETRON HCL 4 MG PO TABS
4.0000 mg | ORAL_TABLET | ORAL | 0 refills | Status: DC | PRN
  Filled 2023-01-12: qty 12, 2d supply, fill #0

## 2023-01-12 MED ORDER — DICYCLOMINE HCL 20 MG PO TABS
20.0000 mg | ORAL_TABLET | Freq: Two times a day (BID) | ORAL | 0 refills | Status: DC
  Filled 2023-01-12: qty 20, 10d supply, fill #0

## 2023-01-12 MED ORDER — SUCRALFATE 1 G PO TABS
1.0000 g | ORAL_TABLET | Freq: Three times a day (TID) | ORAL | 0 refills | Status: DC
  Filled 2023-01-12: qty 21, 7d supply, fill #0

## 2023-01-12 MED ORDER — ONDANSETRON HCL 4 MG/2ML IJ SOLN
4.0000 mg | Freq: Once | INTRAMUSCULAR | Status: AC
  Administered 2023-01-12: 4 mg via INTRAVENOUS
  Filled 2023-01-12: qty 2

## 2023-01-12 MED ORDER — IOHEXOL 300 MG/ML  SOLN
100.0000 mL | Freq: Once | INTRAMUSCULAR | Status: AC | PRN
  Administered 2023-01-12: 85 mL via INTRAVENOUS

## 2023-01-12 NOTE — ED Triage Notes (Signed)
Pt arrives to ED with c/o epigastric abdominal pains intermittent over the past 3 weeks. Pt notes emesis, constipation, bloating, heartburn. Eating tends to worsen the pain.

## 2023-01-12 NOTE — ED Notes (Signed)
RT placed IV in one stick

## 2023-01-12 NOTE — ED Notes (Signed)
Patient verbalizes understanding of discharge instructions. Opportunity for questioning and answers were provided. Patient discharged from ED.  °

## 2023-01-12 NOTE — Discharge Instructions (Addendum)
Please follow-up with gastroenterology for further evaluation and gastric emptying study  It was a pleasure caring for you today in the emergency department.  Please return to the emergency department for any worsening or worrisome symptoms.   Please follow up with your primary care doctor within 2-3 days. For constipation we also recommend a diet high in fiber (beans, fruits, vegetables, whole grains). Take Colace 100-200 mg up to three times per day. You may take along with Senokot 1-2 tabs, ingest with full glass of water.  You may also take MiraLAX 1-2 capfuls 1-2 times a day until stools become soft and then slowly decrease the amount of MiraLAX used.  Maintain fluid intake 6-8 glasses per day. Please increase fibers in your diet. You may also take Milk of Magnesia 30 mL as needed for constipation, you may repeat in 2 hours again if no bowl movement.

## 2023-01-12 NOTE — ED Provider Notes (Signed)
[facility] EMERGENCY DEPARTMENT AT [facility] Provider Note  CSN: [id number] Arrival date & time: 01/12/23 5784  Chief Complaint(s) Abdominal Pain  HPI Morgan Chung is a 37 y.o. female with past medical history as below, significant for asthma, Crohn's disease, gastritis, GERD, constipation who presents to the ED with complaint of 3 weeks of intermittent abdominal pain.   Pain primarily epigastric, sharp, stabbing.  Worsened from baseline discomfort.  She has early satiety, nausea, reduced p.o. intake.  No fevers or chills, she does reduce stool caliber and stool output, no melena or BRBPR.  No fevers or chills.  No recent abdominal trauma.  No recent bowel surgery.  Has not noted any blood in her stool.  No change to urination, no rashes or fevers.  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 Authorizing Provider  dicyclomine (BENTYL) 20 MG tablet Take 1 tablet (20 mg total) by mouth 2 (two) times daily. 01/12/23  Yes Tanda Rockers A, DO  hydrochlorothiazide (HYDRODIURIL) 25 MG tablet Take 25 mg by mouth daily. 11/02/22  Yes  [provider]  mercaptopurine (PURINETHOL) 50 MG tablet Take by mouth. 01/10/23  Yes [provider]  omeprazole (PRILOSEC) 40 MG capsule Take 1 capsule by mouth daily. 01/07/23  Yes [provider]  ondansetron (ZOFRAN) 4 MG tablet Take 1 tablet (4 mg total) by mouth every 4 (four) hours as needed for nausea or vomiting. 01/12/23  Yes Sloan Leiter, DO  rifaximin (XIFAXAN) 550 MG TABS tablet Take 550 mg by mouth 3 (three) times daily. 01/07/23 01/21/23 Yes [provider]  sucralfate (CARAFATE) 1 g tablet Take 1 tablet (1 g total) by mouth with breakfast, with lunch, and with evening meal for 7 days. 01/12/23 01/19/23 Yes Tanda Rockers A, DO  AMLODIPINE BENZOATE PO Take 10 mg by mouth.    [provider]  benzonatate (TESSALON) 100 MG capsule Take 1 capsule (100 mg total) by mouth 3 (three) times daily as needed for cough. 03/26/22   Waldon Merl, PA-C  cromolyn (OPTICROM) 4 % ophthalmic solution Place 1 drop into both eyes 4 (four) times daily as needed (itchy/watery eyes). Patient not taking: Reported on 03/24/2022 03/04/22   Ellamae Sia, DO  HYDROCHLOROTHIAZIDE PO Take by mouth.    [provider]  MERCAPTOPURINE PO Take by mouth.    [provider]  montelukast (SINGULAIR) 10 MG tablet Take 1 tablet (10 mg total) by mouth at bedtime. 03/04/22   Ellamae Sia, DO  predniSONE (STERAPRED UNI-PAK 21 TAB) 10 MG (21) TBPK tablet 6 day taper; take as directed on package  instructions 10/12/22   Margaretann Loveless, PA-C  PROAIR HFA [phone] Base) MCG/ACT inhaler Inhale 2 puffs into the lungs every 4 (four) hours as needed for wheezing or shortness of breath (ex-induced). 06/11/20   [provider]  SYMBICORT 160-4.5 MCG/ACT inhaler Inhale 1 puff into the lungs 2 (two) times daily. 01/23/22   [provider]  zolpidem (AMBIEN) 10 MG tablet Take 10 mg by mouth daily. 02/17/22   [provider]                                                                                                                                     Past Surgical History Past Surgical History:  Procedure Laterality Date   BOWEL RESECTION     CESAREAN SECTION     Family History Family History  Problem Relation Age of Onset   Hypertension Mother    Irritable bowel syndrome Mother    Diabetes Father    Hypertension Father    Irritable bowel syndrome Sister    Colon cancer Neg Hx    Pancreatic cancer Neg Hx    Stomach cancer Neg Hx    Esophageal cancer Neg Hx    Liver cancer Neg Hx    Colon polyps Neg Hx     Social History Social History   Tobacco Use   Smoking status: Never   Smokeless tobacco: Never  Vaping Use   Vaping status: Never Used  Substance Use Topics   Alcohol use: Yes    Comment: occasionally   Drug use: Never   Allergies Banana, Other, and Latex  Review of Systems Review of Systems  Constitutional:  Negative for activity change and fever.  HENT:  Negative for facial swelling and trouble swallowing.   Eyes:  Negative for discharge and redness.  Respiratory:  Negative for cough and shortness of breath.   Cardiovascular:  Negative for chest pain and palpitations.  Gastrointestinal:  Positive for abdominal pain, constipation and nausea.  Genitourinary:  Negative for dysuria and flank pain.  Musculoskeletal:  Negative for back pain and gait problem.  Skin:  Negative for pallor and rash.  Neurological:  Negative for syncope and headaches.    Physical Exam Vital Signs  I have reviewed the triage vital signs BP (!) 117/59   Pulse 63   Temp 98.2 F (36.8 C) (Oral)   Resp 20   Ht 5\' 4"  (1.626 m)   Wt 96.6 kg   SpO2 99%   BMI 36.56 kg/m  Physical Exam Vitals and nursing note reviewed.  Constitutional:      General: She is not in acute distress.    Appearance: Normal appearance. She is well-developed. She is obese. She is not ill-appearing or diaphoretic.  HENT:     Head: Normocephalic and  atraumatic.     Right Ear: External ear normal.     Left Ear: External ear normal.  Nose: Nose normal.     Mouth/Throat:     Mouth: Mucous membranes are moist.  Eyes:     General: No scleral icterus.       Right eye: No discharge.        Left eye: No discharge.  Cardiovascular:     Rate and Rhythm: Normal rate and regular rhythm.     Pulses: Normal pulses.     Heart sounds: Normal heart sounds.  Pulmonary:     Effort: Pulmonary effort is normal. No respiratory distress.     Breath sounds: Normal breath sounds. No stridor.  Abdominal:     General: Abdomen is flat. There is no distension.     Palpations: Abdomen is soft.     Tenderness: There is abdominal tenderness in the epigastric area.  Musculoskeletal:     Cervical back: No rigidity.     Right lower leg: No edema.     Left lower leg: No edema.  Skin:    General: Skin is warm and dry.     Capillary Refill: Capillary refill takes less than 2 seconds.  Neurological:     Mental Status: She is alert.  Psychiatric:        Mood and Affect: Mood normal.        Behavior: Behavior normal. Behavior is cooperative.     ED Results and Treatments Labs (all labs ordered are listed, but only abnormal results are displayed) Labs Reviewed  LIPASE, BLOOD - Abnormal; Notable for the following components:      Result Value   Lipase 5 (*)    All other components within normal limits  COMPREHENSIVE METABOLIC PANEL - Abnormal; Notable for the following components:   Sodium 147 (*)    Potassium 1.8 (*)    Chloride 129 (*)    CO2 12 (*)    Glucose, Bld 52 (*)    Creatinine, Ser 0.30 (*)    Calcium 4.1 (*)    Total Protein 3.2 (*)    Albumin 2.0 (*)    AST 8 (*)    Alkaline Phosphatase 15 (*)    All other components within normal limits  CBC WITH DIFFERENTIAL/PLATELET - Abnormal; Notable for the following components:   WBC 3.9 (*)    Hemoglobin 10.5 (*)    HCT 35.1 (*)    MCH 24.6 (*)    MCHC 29.9 (*)    All other components  within normal limits  BASIC METABOLIC PANEL - Abnormal; Notable for the following components:   Sodium 134 (*)    Glucose, Bld 141 (*)    Calcium 8.5 (*)    All other components within normal limits  URINALYSIS, ROUTINE W REFLEX MICROSCOPIC  PREGNANCY, URINE                                                                                                                          Radiology CT ABDOMEN PELVIS W CONTRAST  Result Date: 01/12/2023 CLINICAL DATA:  Epigastric pain.  History of Crohn disease. EXAM: CT ABDOMEN AND PELVIS WITH CONTRAST TECHNIQUE: Multidetector CT imaging of the abdomen and pelvis was performed using the standard protocol following bolus administration of intravenous contrast. RADIATION DOSE REDUCTION: This exam was performed according to the departmental dose-optimization program which includes automated exposure control, adjustment of the mA and/or kV according to patient size and/or use of iterative reconstruction technique. CONTRAST:  85mL OMNIPAQUE IOHEXOL 300 MG/ML  SOLN COMPARISON:  CT 12/26/2020. FINDINGS: Lower chest: Slight left lung base scar or atelectasis. No pleural effusion. Hepatobiliary: No focal liver abnormality is seen. No gallstones, gallbladder wall thickening, or biliary dilatation. Pancreas: Unremarkable. No pancreatic ductal dilatation or surrounding inflammatory changes. Spleen: Normal in size without focal abnormality. Adrenals/Urinary Tract: Adrenal glands are unremarkable. Kidneys are normal, without renal calculi, focal lesion, or hydronephrosis. Bladder is unremarkable. Stomach/Bowel: No oral contrast. There is fluid in the stomach. Small bowel is nondilated. Surgical changes identified in the right lower quadrant along the colon and adjacent small bowel this is new from the prior CT examination. Please correlate clinical history. Scattered colonic stool. Vascular/Lymphatic: No significant vascular findings are present. No enlarged abdominal or pelvic  lymph nodes. Reproductive: Uterus and bilateral adnexa are unremarkable. Other: No free intra-air. Trace simple free fluid in the pelvis. There is some fat haziness in the right lower quadrant mesentery. Nonspecific. Musculoskeletal: No acute or significant osseous findings. IMPRESSION: No bowel obstruction, free air. Surgical changes in the right lower quadrant along loops of proximal colon distal small bowel consistent with the history of previous resection. Trace stranding in the right lower quadrant mesentery and simple fluid in the pelvis. Nonspecific. Electronically Signed   By: Karen Kays M.D.   On: 01/12/2023 11:40    Pertinent labs & imaging results that were available during my care of the patient were reviewed by me and considered in my medical decision making (see MDM for details).  Medications Ordered in ED Medications  sodium chloride 0.9 % bolus 1,000 mL (0 mLs Intravenous Stopped 01/12/23 1032)  ondansetron (ZOFRAN) injection 4 mg (4 mg Intravenous Given 01/12/23 0848)  HYDROmorphone (DILAUDID) injection 1 mg (1 mg Intravenous Given 01/12/23 0848)  iohexol (OMNIPAQUE) 300 MG/ML solution 100 mL (85 mLs Intravenous Contrast Given 01/12/23 1017)  ondansetron (ZOFRAN) injection 4 mg (4 mg Intravenous Given 01/12/23 1242)                                                                                                                                     Procedures Procedures  (including critical care time)  Medical Decision Making / ED Course    Medical Decision Making:    Morgan Chung is a 37 y.o. female with past medical history as below, significant for asthma, Crohn's disease, gastritis, GERD, constipation who presents to the ED with complaint of 3 weeks of intermittent abdominal pain.. The complaint involves an extensive differential diagnosis and  also carries with it a high risk of complications and morbidity.  Serious etiology was considered. Ddx includes but is not limited to:  Differential diagnosis includes but is not exclusive to acute cholecystitis, intrathoracic causes for epigastric abdominal pain, gastritis, duodenitis, pancreatitis, small bowel or large bowel obstruction, abdominal aortic aneurysm, hernia, gastritis, etc.   Complete initial physical exam performed, notably the patient  was abdomen nonperitoneal, no acute distress, HDS.    Reviewed and confirmed nursing documentation for past medical history, family history, social history.  Vital signs reviewed.    Narrative: 37 year old female history of Crohn's disease, recurrent constipation here with abdominal pain, nausea and vomiting Intermittent symptoms of the past few weeks, worsened in the past few days.  Difficulty telling p.o. intake secondary to early satiety and nausea and vomiting. Reports symptoms feel similar to prior bowel obstruction Will collect screening labs, get imaging, give analgesics and IV fluids No melena or BRBPR, no rectal bleeding.  Low suspicion for acute Crohn's flare  Clinical Course as of 01/12/23 1411  Tue Jan 12, 2023  1028 Will repeat metabolic panel [SG]  1032 Potassium(!!): 1.8 ECG w/o u-waves, will repeat metabolic panel [SG]  1118 Rpt metabolic panel stable, favor lab error in initial [SG]  1230 Stable imaging, labs stable, feeling better overall, will PO challenge  [SG]    Clinical Course User Index [SG] Sloan Leiter, DO    She is feeling much better on recheck, labs and imaging are stable.  She is time p.o. intake much difficulty.  Nausea and discomfort has resolved.  She follows with gastroenterology, she has gastric emptying study next month.  Recommend small meals, limiting with meal replacement drinks as needed.  Follow-up with gastroenterology and with PCP.  The patient's overall condition has improved, the patient presents with abdominal pain without signs of peritonitis, or other life-threatening serious etiology. The patient understands that at this  time there is no evidence for a more malignant underlying process, but the patient also understands that early in the process of an illness, an emergency department workup can be falsely reassuring. Detailed discussions were had with the patient regarding current findings, and need for close f/u with PCP or on call doctor. The patient appears stable for discharge and has been instructed to return immediately if the symptoms worsen in any way or if not improved for re-evaluation. Patient verbalized understanding and is in agreement with current care plan.  All questions answered prior to discharge.                Additional history obtained: -Additional history obtained from na -External records from outside source obtained and reviewed including: Chart review including previous notes, labs, imaging, consultation notes including  Home medications, primary care documentation, prior GI evaluation   Lab Tests: -I ordered, reviewed, and interpreted labs.   The pertinent results include:   Labs Reviewed  LIPASE, BLOOD - Abnormal; Notable for the following components:      Result Value   Lipase 5 (*)    All other components within normal limits  COMPREHENSIVE METABOLIC PANEL - Abnormal; Notable for the following components:   Sodium 147 (*)    Potassium 1.8 (*)    Chloride 129 (*)    CO2 12 (*)    Glucose, Bld 52 (*)    Creatinine, Ser 0.30 (*)    Calcium 4.1 (*)    Total Protein 3.2 (*)    Albumin 2.0 (*)    AST 8 (*)  Alkaline Phosphatase 15 (*)    All other components within normal limits  CBC WITH DIFFERENTIAL/PLATELET - Abnormal; Notable for the following components:   WBC 3.9 (*)    Hemoglobin 10.5 (*)    HCT 35.1 (*)    MCH 24.6 (*)    MCHC 29.9 (*)    All other components within normal limits  BASIC METABOLIC PANEL - Abnormal; Notable for the following components:   Sodium 134 (*)    Glucose, Bld 141 (*)    Calcium 8.5 (*)    All other components within normal  limits  URINALYSIS, ROUTINE W REFLEX MICROSCOPIC  PREGNANCY, URINE    Notable for labs stable  EKG   EKG Interpretation Date/Time:  Tuesday January 12 2023 10:30:04 EDT Ventricular Rate:  71 PR Interval:  165 QRS Duration:  109 QT Interval:  417 QTC Calculation: 454 R Axis:   30  Text Interpretation: Sinus rhythm Low voltage, precordial leads Borderline T abnormalities, anterior leads Confirmed by Tanda Rockers ([id number]) on 01/12/2023 12:40:34 PM         Imaging Studies ordered: I ordered imaging studies including CT abdomen I independently visualized the following imaging with scope of interpretation limited to determining acute life threatening conditions related to emergency care; findings noted above, significant for no acute process I independently visualized and interpreted imaging. I agree with the radiologist interpretation   Medicines ordered and prescription drug management: Meds ordered this encounter  Medications   sodium chloride 0.9 % bolus 1,000 mL   ondansetron (ZOFRAN) injection 4 mg   HYDROmorphone (DILAUDID) injection 1 mg   iohexol (OMNIPAQUE) 300 MG/ML solution 100 mL   ondansetron (ZOFRAN) injection 4 mg   ondansetron (ZOFRAN) 4 MG tablet    Sig: Take 1 tablet (4 mg total) by mouth every 4 (four) hours as needed for nausea or vomiting.    Dispense:  12 tablet    Refill:  0   dicyclomine (BENTYL) 20 MG tablet    Sig: Take 1 tablet (20 mg total) by mouth 2 (two) times daily.    Dispense:  20 tablet    Refill:  0   sucralfate (CARAFATE) 1 g tablet    Sig: Take 1 tablet (1 g total) by mouth with breakfast, with lunch, and with evening meal for 7 days.    Dispense:  21 tablet    Refill:  0    -I have reviewed the patients home medicines and have made adjustments as needed   Consultations Obtained: na   Cardiac Monitoring: Continuous pulse oximetry interpreted by myself, 99% on RA.    Social Determinants of Health:  Diagnosis or treatment  significantly limited by social determinants of health: obesity   Reevaluation: After the interventions noted above, I reevaluated the patient and found that they have improved  Co morbidities that complicate the patient evaluation  Past Medical History:  Diagnosis Date   Anemia    Asthma    Crohn's disease (HCC)    Gastritis    GERD (gastroesophageal reflux disease)    Hypertension    Insomnia       Dispostion: Disposition decision including need for hospitalization was considered, and patient discharged from emergency department.    Final Clinical Impression(s) / ED Diagnoses Final diagnoses:  Epigastric pain        Sloan Leiter, DO 01/12/23 1411

## 2023-02-27 ENCOUNTER — Telehealth: Payer: Medicaid Other | Admitting: Physician Assistant

## 2023-02-27 DIAGNOSIS — J22 Unspecified acute lower respiratory infection: Secondary | ICD-10-CM

## 2023-02-27 MED ORDER — AZITHROMYCIN 250 MG PO TABS
ORAL_TABLET | ORAL | 0 refills | Status: DC
Start: 2023-02-27 — End: 2023-08-28

## 2023-02-27 NOTE — Progress Notes (Signed)
Virtual Visit Consent   Morgan Chung, you are scheduled for a virtual visit with a [facility] provider today. Just as with appointments in the office, your consent must be obtained to participate. Your consent will be active for this visit and any virtual visit you may have with one of our providers in the next 365 days. If you have a MyChart account, a copy of this consent can be sent to you electronically.  As this is a virtual visit, video technology does not allow for your provider to perform a traditional examination. This may limit your provider's ability to fully assess your condition. If your provider identifies any concerns that need to be evaluated in person or the need to arrange testing (such as labs, EKG, etc.), we will make arrangements to do so. Although advances in technology are sophisticated, we cannot ensure that it will always work on either your end or our end. If the connection with a video visit is poor, the visit may have to be switched to a telephone visit. With either a video or telephone visit, we are not always able to ensure that we have a secure connection.  By engaging in this virtual visit, you consent to the provision of healthcare and authorize for your insurance to be billed (if applicable) for the services provided during this visit. Depending on your insurance coverage, you may receive a charge related to this service.  I need to obtain your verbal consent now. Are you willing to proceed with your visit today? Morgan Chung has provided verbal consent on 02/27/2023 for a virtual visit (video or telephone). Gilberto Better, [place]  Date: 02/27/2023 2:15 PM  Virtual Visit via Video Note   I, Morgan Chung, connected with  Morgan Chung  ([id number], 1986/03/08) on 02/27/23 at  2:00 PM EDT by a video-enabled telemedicine application and verified that I am speaking with the correct person using two identifiers.  Location: Patient: Virtual Visit Location Patient: Home Provider:  Virtual Visit Location Provider: Home Office   I discussed the limitations of evaluation and management by telemedicine and the availability of in person appointments. The patient expressed understanding and agreed to proceed.    History of Present Illness: Morgan Chung is a 37 y.o. who identifies as a female who was assigned female at birth, and is being seen today for cough and congestion.  HPI: 37 y/o F presents for ongoing cough and congestion x 8 days. Was seen at [facility] clinic, but has continued to have cough and congestion. Promethazine cough syrup hasn't helped much. She is taking Mucinex-DM without any relief.     Problems:  Patient Active Problem List   Diagnosis Date Noted   Other allergic rhinitis 03/04/2022   Allergic conjunctivitis of both eyes 03/04/2022   Dyspnea on exertion 03/04/2022   Oral allergy syndrome, subsequent encounter 03/04/2022   Rash and other nonspecific skin eruption 03/04/2022   Other adverse food reactions, not elsewhere classified, subsequent encounter 03/04/2022   Bowel obstruction (HCC) 12/26/2020   Constipation 08/23/2020   Nausea and vomiting 08/23/2020   Abdominal pain 08/07/2020   Insomnia 08/07/2020   Microcytic anemia 08/07/2020   Crohn's disease of small intestine with complication (HCC)     Allergies:  Allergies  Allergen Reactions   Banana Itching, Swelling and Other (See Comments)    Mouth itches and swells   Other Itching, Swelling and Other (See Comments)    (Fruit) MELON MIX- Mouth itches and swells   Latex Rash  Medications:  Current Outpatient Medications:    azithromycin (ZITHROMAX) 250 MG tablet, Take 2 tablets on day 1, then 1 tablet daily on days 2 through 5, Disp: 6 tablet, Rfl: 0   AMLODIPINE BENZOATE PO, Take 10 mg by mouth., Disp: , Rfl:    benzonatate (TESSALON) 100 MG capsule, Take 1 capsule (100 mg total) by mouth 3 (three) times daily as needed for cough., Disp: 30 capsule, Rfl: 0   cromolyn (OPTICROM) 4 % ophthalmic  solution, Place 1 drop into both eyes 4 (four) times daily as needed (itchy/watery eyes). (Patient not taking: Reported on 03/24/2022), Disp: 10 mL, Rfl: 3   dicyclomine (BENTYL) 20 MG tablet, Take 1 tablet (20 mg total) by mouth 2 (two) times daily., Disp: 20 tablet, Rfl: 0   hydrochlorothiazide (HYDRODIURIL) 25 MG tablet, Take 25 mg by mouth daily., Disp: , Rfl:    HYDROCHLOROTHIAZIDE PO, Take by mouth., Disp: , Rfl:    mercaptopurine (PURINETHOL) 50 MG tablet, Take by mouth., Disp: , Rfl:    MERCAPTOPURINE PO, Take by mouth., Disp: , Rfl:    montelukast (SINGULAIR) 10 MG tablet, Take 1 tablet (10 mg total) by mouth at bedtime., Disp: 30 tablet, Rfl: 5   omeprazole (PRILOSEC) 40 MG capsule, Take 1 capsule by mouth daily., Disp: , Rfl:    ondansetron (ZOFRAN) 4 MG tablet, Take 1 tablet (4 mg total) by mouth every 4 (four) hours as needed for nausea or vomiting., Disp: 12 tablet, Rfl: 0   predniSONE (STERAPRED UNI-PAK 21 TAB) 10 MG (21) TBPK tablet, 6 day taper; take as directed on package instructions, Disp: 21 tablet, Rfl: 0   PROAIR HFA 108 (90 Base) MCG/ACT inhaler, Inhale 2 puffs into the lungs every 4 (four) hours as needed for wheezing or shortness of breath (ex-induced)., Disp: , Rfl:    sucralfate (CARAFATE) 1 g tablet, Take 1 tablet (1 g total) by mouth with breakfast, with lunch, and with evening meal for 7 days., Disp: 21 tablet, Rfl: 0   SYMBICORT 160-4.5 MCG/ACT inhaler, Inhale 1 puff into the lungs 2 (two) times daily., Disp: , Rfl:    zolpidem (AMBIEN) 10 MG tablet, Take 10 mg by mouth daily., Disp: , Rfl:   Observations/Objective: Patient is well-developed, well-nourished in no acute distress.  Resting comfortably  at home.  Head is normocephalic, atraumatic.  No labored breathing.  Speech is clear and coherent with logical content.  Patient is alert and oriented at baseline.    Assessment and Plan: 1. Lower respiratory infection - azithromycin (ZITHROMAX) 250 MG tablet;  Take 2 tablets on day 1, then 1 tablet daily on days 2 through 5  Dispense: 6 tablet; Refill: 0  Increase fluids Start medicine as prescribed. Re-start albuterol inhaler. Pt unable to take Prednisone due to elevated HR. May continue with Mucinex-DM if needed as directed on the box. Continue to watch for worsening symptoms. Contact us if symptoms don't improve. Pt verbalized understanding and in agreement.    Follow Up Instructions: I discussed the assessment and treatment plan with the patient. The patient was provided an opportunity to ask questions and all were answered. The patient agreed with the plan and demonstrated an understanding of the instructions.  A copy of instructions were sent to the patient via MyChart unless otherwise noted below.   Patient has requested to receive PHI (AVS, Work Notes, etc) pertaining to this video visit through e-mail as they are currently without active MyChart. They have voiced understand that email is  not considered secure and their health information could be viewed by someone other than the patient.   The patient was advised to call back or seek an in-person evaluation if the symptoms worsen or if the condition fails to improve as anticipated.    Gilberto Better, PA-C

## 2023-02-27 NOTE — Patient Instructions (Addendum)
There were no vitals taken for this visit. Lieutenant Diego, thank you for joining Gilberto Better, PA-C for today's virtual visit.  While this provider is not your primary care provider (PCP), if your PCP is located in our provider database this encounter information will be shared with them immediately following your visit.   A [facility] MyChart account gives you access to today's visit and all your visits, tests, and labs performed at [facility] " click here if you don't have a [facility] MyChart account or go to [email or web address]  Consent: (Patient) Zona Pedro provided verbal consent for this virtual visit at the beginning of the encounter.  Current Medications:  Current Outpatient Medications:    azithromycin (ZITHROMAX) 250 MG tablet, Take 2 tablets on day 1, then 1 tablet daily on days 2 through 5, Disp: 6 tablet, Rfl: 0   AMLODIPINE BENZOATE PO, Take 10 mg by mouth., Disp: , Rfl:    benzonatate (TESSALON) 100 MG capsule, Take 1 capsule (100 mg total) by mouth 3 (three) times daily as needed for cough., Disp: 30 capsule, Rfl: 0   cromolyn (OPTICROM) 4 % ophthalmic solution, Place 1 drop into both eyes 4 (four) times daily as needed (itchy/watery eyes). (Patient not taking: Reported on 03/24/2022), Disp: 10 mL, Rfl: 3   dicyclomine (BENTYL) 20 MG tablet, Take 1 tablet (20 mg total) by mouth 2 (two) times daily., Disp: 20 tablet, Rfl: 0   hydrochlorothiazide (HYDRODIURIL) 25 MG tablet, Take 25 mg by mouth daily., Disp: , Rfl:    HYDROCHLOROTHIAZIDE PO, Take by mouth., Disp: , Rfl:    mercaptopurine (PURINETHOL) 50 MG tablet, Take by mouth., Disp: , Rfl:    MERCAPTOPURINE PO, Take by mouth., Disp: , Rfl:    montelukast (SINGULAIR) 10 MG tablet, Take 1 tablet (10 mg total) by mouth at bedtime., Disp: 30 tablet, Rfl: 5   omeprazole (PRILOSEC) 40 MG capsule, Take 1 capsule by mouth daily., Disp: , Rfl:    ondansetron (ZOFRAN) 4 MG tablet, Take 1 tablet (4 mg total) by mouth every  4 (four) hours as needed for nausea or vomiting., Disp: 12 tablet, Rfl: 0   predniSONE (STERAPRED UNI-PAK 21 TAB) 10 MG (21) TBPK tablet, 6 day taper; take as directed on package instructions, Disp: 21 tablet, Rfl: 0   PROAIR HFA 108 (90 Base) MCG/ACT inhaler, Inhale 2 puffs into the lungs every 4 (four) hours as needed for wheezing or shortness of breath (ex-induced)., Disp: , Rfl:    sucralfate (CARAFATE) 1 g tablet, Take 1 tablet (1 g total) by mouth with breakfast, with lunch, and with evening meal for 7 days., Disp: 21 tablet, Rfl: 0   SYMBICORT 160-4.5 MCG/ACT inhaler, Inhale 1 puff into the lungs 2 (two) times daily., Disp: , Rfl:    zolpidem (AMBIEN) 10 MG tablet, Take 10 mg by mouth daily., Disp: , Rfl:    Medications ordered in this encounter:  Meds ordered this encounter  Medications   azithromycin (ZITHROMAX) 250 MG tablet    Sig: Take 2 tablets on day 1, then 1 tablet daily on days 2 through 5    Dispense:  6 tablet    Refill:  0    Order Specific Question:   Supervising Provider    Answer:   Merrilee Jansky [[id number]]     *If you need refills on other medications prior to your next appointment, please contact your pharmacy*  Follow-Up: Call back or seek an in-person evaluation if the symptoms worsen  or if the condition fails to improve as anticipated.  [facility] [phone]  Other Instructions  Increase fluids Start medicine as prescribed. Re-start albuterol inhaler. Pt unable to take Prednisone due to elevated HR. May continue with Mucinex-DM if needed as directed on the box. Continue to watch for worsening symptoms. Contact us if symptoms don't improve.   If you have been instructed to have an in-person evaluation today at a local Urgent Care facility, please use the link below. It will take you to a list of all of our available [facility], including address, phone number and hours of operation. Please do not delay care.  [facility]  If you or a family member do not have a primary care provider, use the link below to schedule a visit and establish care. When you choose a [facility] primary care physician or advanced practice provider, you gain a long-term partner in health. Find a Primary Care Provider  Learn more about [facility]'s in-office and virtual care options: [facility] - Get Care Now

## 2023-03-29 ENCOUNTER — Ambulatory Visit: Payer: Medicaid Other | Admitting: Obstetrics and Gynecology

## 2023-06-07 ENCOUNTER — Other Ambulatory Visit (HOSPITAL_COMMUNITY)
Admission: RE | Admit: 2023-06-07 | Discharge: 2023-06-07 | Disposition: A | Discharge status: Home / Self Care | Payer: Medicaid Other | Source: Ambulatory Visit | Attending: Obstetrics and Gynecology | Admitting: Obstetrics and Gynecology

## 2023-06-07 ENCOUNTER — Ambulatory Visit: Payer: Medicaid Other | Admitting: Obstetrics and Gynecology

## 2023-06-07 ENCOUNTER — Encounter: Payer: Self-pay | Admitting: Obstetrics and Gynecology

## 2023-06-07 VITALS — BP 136/89 | HR 89 | Ht 64.0 in | Wt 220.6 lb

## 2023-06-07 DIAGNOSIS — N939 Abnormal uterine and vaginal bleeding, unspecified: Secondary | ICD-10-CM

## 2023-06-07 DIAGNOSIS — Z113 Encounter for screening for infections with a predominantly sexual mode of transmission: Secondary | ICD-10-CM

## 2023-06-07 NOTE — Progress Notes (Addendum)
GYNECOLOGY  VISIT   HPI: Morgan Chung is a 38 y.o.   Single female No obstetric history on file. here for abnormal uterine bleeding.     Patient's last menstrual period was 05/24/2023 (approximate).  The patient typically has light to moderate periods lasting 3-5 days with spotting for last two days. Cycles were 26 days in length.  Starting November 2024, patient started having heavier periods, changing sanitary pad 8 times daily. Periods lasts 5 days, now without lightening up. Having more frequent cycles, 22-24 days. No bleeding between periods.  She has associated breast tenderness, cramping, clotting (all new symptoms).   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   Rash and other nonspecific skin eruption 03/04/2022   Other adverse food reactions, not elsewhere classified, subsequent encounter 03/04/2022   Bowel obstruction (HCC) 12/26/2020   Constipation 08/23/2020   Nausea and vomiting 08/23/2020   Abdominal pain 08/07/2020   Insomnia 08/07/2020   Microcytic anemia 08/07/2020   Crohn's disease of small intestine with complication (HCC)     Past Medical History:  Diagnosis Date   Anemia    Asthma    Crohn's disease (HCC)    Gastritis    GERD (gastroesophageal reflux disease)    Hypertension    Insomnia     Past Surgical History:  Procedure Laterality Date   BOWEL RESECTION     CESAREAN SECTION      Current Outpatient Medications  Medication Sig Dispense Refill   hydrochlorothiazide (HYDRODIURIL) 25 MG tablet Take 25 mg by mouth daily.      mercaptopurine (PURINETHOL) 50 MG tablet Take by mouth.     ondansetron (ZOFRAN) 4 MG tablet Take 1 tablet (4 mg total) by mouth every 4 (four) hours as needed for nausea or vomiting. 12 tablet 0   PROAIR HFA 108 (90 Base) MCG/ACT inhaler Inhale 2 puffs into the lungs every 4 (four) hours as needed for wheezing or shortness of breath (ex-induced).     zolpidem (AMBIEN) 10 MG tablet Take 10 mg by mouth daily.     AMLODIPINE BENZOATE PO Take 10 mg by mouth. (Patient not taking: Reported on 06/07/2023)     azithromycin (ZITHROMAX) 250 MG tablet Take 2 tablets on day 1, then 1 tablet daily on days 2 through 5 6 tablet 0   benzonatate (TESSALON) 100 MG capsule Take 1 capsule (100 mg total) by mouth 3 (three) times daily as needed for cough. 30 capsule 0   cromolyn (OPTICROM) 4 % ophthalmic solution Place 1 drop into both eyes 4 (four) times daily as needed (itchy/watery eyes). (Patient not taking: Reported on 03/24/2022) 10 mL 3   dicyclomine (BENTYL) 20 MG tablet Take 1 tablet (20 mg total) by mouth 2 (two) times daily. 20 tablet 0   HYDROCHLOROTHIAZIDE PO Take by mouth.     MERCAPTOPURINE PO Take by mouth.     montelukast (SINGULAIR) 10 MG tablet Take 1 tablet (10 mg total) by mouth at bedtime. 30 tablet 5   omeprazole (PRILOSEC) 40 MG capsule Take 1 capsule by mouth daily.  predniSONE (STERAPRED UNI-PAK 21 TAB) 10 MG (21) TBPK tablet 6 day taper; take as directed on package instructions 21 tablet 0   sucralfate (CARAFATE) 1 g tablet Take 1 tablet (1 g total) by mouth with breakfast, with lunch, and with evening meal for 7 days. 21 tablet 0   SYMBICORT 160-4.5 MCG/ACT inhaler Inhale 1 puff into the lungs 2 (two) times daily. (Patient not taking: Reported on 06/07/2023)     No current facility-administered medications for this visit.     ALLERGIES: Banana, Other, and Latex  Family History  Problem Relation Age of Onset   Hypertension Mother    Irritable bowel syndrome Mother    Diabetes  Father    Hypertension Father    Irritable bowel syndrome Sister    Colon cancer Neg Hx    Pancreatic cancer Neg Hx    Stomach cancer Neg Hx    Esophageal cancer Neg Hx    Liver cancer Neg Hx    Colon polyps Neg Hx     Social History   Socioeconomic History   Marital status: Single    Spouse name: Not on file   Number of children: Not on file   Years of education: Not on file   Highest education level: Not on file  Occupational History   Not on file  Tobacco Use   Smoking status: Never   Smokeless tobacco: Never  Vaping Use   Vaping status: Never Used  Substance and Sexual Activity   Alcohol use: Yes    Comment: occasionally   Drug use: Never   Sexual activity: Yes    Partners: Male    Birth control/protection: None  Other Topics Concern   Not on file  Social History Narrative   Not on file   Social Drivers of Health   Financial Resource Strain: Not on file  Food Insecurity: Not on file  Transportation Needs: Not on file  Physical Activity: Not on file  Stress: Not on file  Social Connections: Unknown (09/14/2021)   [facility]   Social Network    Social Network: Not on file  Intimate Partner Violence: Not At Risk (06/30/2022)   [facility]   Humiliation, Afraid, Rape, and Kick questionnaire    Fear of Current or Ex-Partner: No    Emotionally Abused: No    Physically Abused: No    Sexually Abused: No    Review of Systems  PHYSICAL EXAMINATION:    BP 136/89   Pulse 89   Ht 5\' 4"  (1.626 m)   Wt 220 lb 9.6 oz (100.1 kg)   LMP 05/24/2023 (Approximate)   BMI 37.87 kg/m     General appearance: alert, cooperative and appears stated age Head: Normocephalic, without obvious abnormality, atraumatic Lungs: clear to auscultation bilaterally Heart: regular rate and rhythm Abdomen: soft, non-tender, no masses,  no organomegaly Extremities: extremities normal, atraumatic, no cyanosis or  edema Skin: Skin color, texture, turgor normal. No rashes or lesions Neurologic: Grossly normal  ASSESSMENT & PLAN  1. Abnormal uterine bleeding (Primary) Patient HDS with regular menses and new increase in bleeding volume. Discussed hormonal therapy options given normal AUB workup. Management pending results of the following tests and imaging: - POCT urine pregnancy - CBC - TSH - US PELVIC COMPLETE WITH TRANSVAGINAL; Future - Cervicovaginal ancillary only( [place])  2. Screening examination for STI - HIV antibody (with reflex) - RPR - HSV-2 Ab, IgG   An  After Visit Summary was printed and given to the patient.  Ralene Muskrat, PA-C 1/20/20253:38 PM   Attestation of Attending Supervision of Advanced Practitioner (CNM/NP): Evaluation and management procedures were performed by the Advanced Practitioner under my supervision and collaboration.  I have reviewed the Advanced Practitioner's note and chart, and I agree with the management and plan.  Rionna Feltes 06/07/2023 4:13 PM

## 2023-06-07 NOTE — Progress Notes (Signed)
Pt. Presents for heavy periods.

## 2023-06-08 LAB — CBC
Hematocrit: 34.2 % (ref 34.0–46.6)
Hemoglobin: 10.5 g/dL — ABNORMAL LOW (ref 11.1–15.9)
MCH: 23.6 pg — ABNORMAL LOW (ref 26.6–33.0)
MCHC: 30.7 g/dL — ABNORMAL LOW (ref 31.5–35.7)
MCV: 77 fL — ABNORMAL LOW (ref 79–97)
Platelets: 207 10*3/uL (ref 150–450)
RBC: 4.44 x10E6/uL (ref 3.77–5.28)
RDW: 16.5 % — ABNORMAL HIGH (ref 11.7–15.4)
WBC: 3.3 10*3/uL — ABNORMAL LOW (ref 3.4–10.8)

## 2023-06-08 LAB — CERVICOVAGINAL ANCILLARY ONLY
Bacterial Vaginitis (gardnerella): NEGATIVE
Candida Glabrata: NEGATIVE
Candida Vaginitis: NEGATIVE
Chlamydia: NEGATIVE
Comment: NEGATIVE
Comment: NEGATIVE
Comment: NEGATIVE
Comment: NEGATIVE
Comment: NEGATIVE
Comment: NORMAL
Neisseria Gonorrhea: NEGATIVE
Trichomonas: NEGATIVE

## 2023-06-08 LAB — TSH: TSH: 1.44 u[IU]/mL (ref 0.450–4.500)

## 2023-06-08 LAB — RPR: RPR Ser Ql: NONREACTIVE

## 2023-06-08 LAB — HIV ANTIBODY (ROUTINE TESTING W REFLEX): HIV Screen 4th Generation wRfx: NONREACTIVE

## 2023-06-08 LAB — HSV-2 AB, IGG: HSV 2 IgG, Type Spec: NONREACTIVE

## 2023-06-21 ENCOUNTER — Ambulatory Visit (HOSPITAL_BASED_OUTPATIENT_CLINIC_OR_DEPARTMENT_OTHER): Payer: Medicaid Other

## 2023-07-22 ENCOUNTER — Telehealth: Admitting: Family Medicine

## 2023-07-22 DIAGNOSIS — J4540 Moderate persistent asthma, uncomplicated: Secondary | ICD-10-CM

## 2023-07-22 DIAGNOSIS — M778 Other enthesopathies, not elsewhere classified: Secondary | ICD-10-CM | POA: Diagnosis not present

## 2023-07-22 DIAGNOSIS — J22 Unspecified acute lower respiratory infection: Secondary | ICD-10-CM | POA: Diagnosis not present

## 2023-07-22 MED ORDER — PREDNISONE 10 MG (21) PO TBPK
ORAL_TABLET | ORAL | 0 refills | Status: DC
Start: 2023-07-22 — End: 2024-03-31

## 2023-07-22 MED ORDER — AZITHROMYCIN 250 MG PO TABS: ORAL_TABLET | ORAL | 0 refills | Status: AC

## 2023-07-22 NOTE — Progress Notes (Signed)
 Virtual Visit Consent   Morgan Chung, you are scheduled for a virtual visit with a [facility] provider today. Just as with appointments in the office, your consent must be obtained to participate. Your consent will be active for this visit and any virtual visit you may have with one of our providers in the next 365 days. If you have a MyChart account, a copy of this consent can be sent to you electronically.  As this is a virtual visit, video technology does not allow for your provider to perform a traditional examination. This may limit your provider's ability to fully assess your condition. If your provider identifies any concerns that need to be evaluated in person or the need to arrange testing (such as labs, EKG, etc.), we will make arrangements to do so. Although advances in technology are sophisticated, we cannot ensure that it will always work on either your end or our end. If the connection with a video visit is poor, the visit may have to be switched to a telephone visit. With either a video or telephone visit, we are not always able to ensure that we have a secure connection.  By engaging in this virtual visit, you consent to the provision of healthcare and authorize for your insurance to be billed (if applicable) for the services provided during this visit. Depending on your insurance coverage, you may receive a charge related to this service.  I need to obtain your verbal consent now. Are you willing to proceed with your visit today? Morgan Chung has provided verbal consent on 07/22/2023 for a virtual visit (video or telephone). Morgan Curio, FNP  Date: 07/22/2023 5:35 PM   Virtual Visit via Video Note   I, Morgan Chung, connected with  Morgan Chung  ([id number], [birth year]) on 07/22/23 at  5:45 PM EST by a video-enabled telemedicine application and verified that I am speaking with the correct person using two identifiers.  Location: Patient: Virtual Visit Location Patient: Home Provider:  Virtual Visit Location Provider: Home Office   I discussed the limitations of evaluation and management by telemedicine and the availability of in person appointments. The patient expressed understanding and agreed to proceed.    History of Present Illness: Morgan Chung is a 38 y.o. who identifies as a female who was assigned female at birth, and is being seen today for peristent cough, wheezing and sob with head congestion and yellow mucus. She is asthmatic and not recovering from the flu. No fever. In no distress. Marland Kitchen  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   Rash and other nonspecific skin eruption 03/04/2022   Other adverse food reactions, not elsewhere classified, subsequent encounter 03/04/2022   Bowel obstruction (HCC) 12/26/2020   Constipation 08/23/2020   Nausea and vomiting 08/23/2020   Abdominal pain 08/07/2020   Insomnia 08/07/2020   Microcytic anemia 08/07/2020   Crohn's disease of small intestine with complication (HCC)     Allergies:  Allergies  Allergen Reactions   Banana Itching, Swelling and Other (See Comments)    Mouth itches and swells   Other Itching, Swelling and Other (See Comments)    (Fruit) MELON MIX- Mouth itches and swells   Latex Rash   Medications:  Current Outpatient Medications:    azithromycin (ZITHROMAX) 250 MG tablet, Take 2 tablets  on day 1, then 1 tablet daily on days 2 through 5, Disp: 6 tablet, Rfl: 0   AMLODIPINE BENZOATE PO, Take 10 mg by mouth. (Patient not taking: Reported on 06/07/2023), Disp: , Rfl:    azithromycin (ZITHROMAX) 250 MG tablet, Take 2 tablets on day 1, then 1 tablet daily on days 2 through 5, Disp: 6 tablet, Rfl: 0   benzonatate (TESSALON) 100 MG capsule, Take 1 capsule (100 mg total) by mouth 3 (three) times daily as  needed for cough., Disp: 30 capsule, Rfl: 0   cromolyn (OPTICROM) 4 % ophthalmic solution, Place 1 drop into both eyes 4 (four) times daily as needed (itchy/watery eyes). (Patient not taking: Reported on 03/24/2022), Disp: 10 mL, Rfl: 3   dicyclomine (BENTYL) 20 MG tablet, Take 1 tablet (20 mg total) by mouth 2 (two) times daily., Disp: 20 tablet, Rfl: 0   hydrochlorothiazide (HYDRODIURIL) 25 MG tablet, Take 25 mg by mouth daily., Disp: , Rfl:    HYDROCHLOROTHIAZIDE PO, Take by mouth., Disp: , Rfl:    mercaptopurine (PURINETHOL) 50 MG tablet, Take by mouth., Disp: , Rfl:    MERCAPTOPURINE PO, Take by mouth., Disp: , Rfl:    montelukast (SINGULAIR) 10 MG tablet, Take 1 tablet (10 mg total) by mouth at bedtime., Disp: 30 tablet, Rfl: 5   omeprazole (PRILOSEC) 40 MG capsule, Take 1 capsule by mouth daily., Disp: , Rfl:    ondansetron (ZOFRAN) 4 MG tablet, Take 1 tablet (4 mg total) by mouth every 4 (four) hours as needed for nausea or vomiting., Disp: 12 tablet, Rfl: 0   predniSONE (STERAPRED UNI-PAK 21 TAB) 10 MG (21) TBPK tablet, 6 day taper; take as directed on package instructions, Disp: 21 tablet, Rfl: 0   PROAIR HFA 108 (90 Base) MCG/ACT inhaler, Inhale 2 puffs into the lungs every 4 (four) hours as needed for wheezing or shortness of breath (ex-induced)., Disp: , Rfl:    sucralfate (CARAFATE) 1 g tablet, Take 1 tablet (1 g total) by mouth with breakfast, with lunch, and with evening meal for 7 days., Disp: 21 tablet, Rfl: 0   SYMBICORT 160-4.5 MCG/ACT inhaler, Inhale 1 puff into the lungs 2 (two) times daily. (Patient not taking: Reported on 06/07/2023), Disp: , Rfl:    zolpidem (AMBIEN) 10 MG tablet, Take 10 mg by mouth daily., Disp: , Rfl:   Observations/Objective: Patient is well-developed, well-nourished in no acute distress.  Resting comfortably  at home.  Head is normocephalic, atraumatic.  No labored breathing.  Speech is clear and coherent with logical content.  Patient is alert and  oriented at baseline.    Assessment and Plan: 1. Moderate persistent asthmatic bronchitis without complication (Primary)  2. Lower respiratory infection  3. Left wrist tendinitis - predniSONE (STERAPRED UNI-PAK 21 TAB) 10 MG (21) TBPK tablet; 6 day taper; take as directed on package instructions  Dispense: 21 tablet; Refill: 0  Increase fluids, continue use of inhaler, UC as needed.  Follow Up Instructions: I discussed the assessment and treatment plan with the patient. The patient was provided an opportunity to ask questions and all were answered. The patient agreed with the plan and demonstrated an understanding of the instructions.  A copy of instructions were sent to the patient via MyChart unless otherwise noted below.     The patient was advised to call back or seek an in-person evaluation if the symptoms worsen or if the condition fails to improve as anticipated.    Morgan Curio, FNP

## 2023-07-22 NOTE — Patient Instructions (Signed)

## 2023-08-17 ENCOUNTER — Emergency Department (HOSPITAL_BASED_OUTPATIENT_CLINIC_OR_DEPARTMENT_OTHER)
Admission: EM | Admit: 2023-08-17 | Discharge: 2023-08-17 | Discharge status: Against Medical Advice | Attending: Emergency Medicine | Admitting: Emergency Medicine

## 2023-08-17 ENCOUNTER — Emergency Department (HOSPITAL_BASED_OUTPATIENT_CLINIC_OR_DEPARTMENT_OTHER)

## 2023-08-17 ENCOUNTER — Other Ambulatory Visit: Payer: Self-pay

## 2023-08-17 ENCOUNTER — Encounter (HOSPITAL_BASED_OUTPATIENT_CLINIC_OR_DEPARTMENT_OTHER): Payer: Self-pay

## 2023-08-17 DIAGNOSIS — I10 Essential (primary) hypertension: Secondary | ICD-10-CM | POA: Insufficient documentation

## 2023-08-17 DIAGNOSIS — K6289 Other specified diseases of anus and rectum: Secondary | ICD-10-CM | POA: Diagnosis present

## 2023-08-17 DIAGNOSIS — Z91040 Latex allergy status: Secondary | ICD-10-CM | POA: Insufficient documentation

## 2023-08-17 DIAGNOSIS — Z79899 Other long term (current) drug therapy: Secondary | ICD-10-CM | POA: Insufficient documentation

## 2023-08-17 DIAGNOSIS — Z5329 Procedure and treatment not carried out because of patient's decision for other reasons: Secondary | ICD-10-CM | POA: Diagnosis not present

## 2023-08-17 DIAGNOSIS — J45909 Unspecified asthma, uncomplicated: Secondary | ICD-10-CM | POA: Diagnosis not present

## 2023-08-17 DIAGNOSIS — K50919 Crohn's disease, unspecified, with unspecified complications: Secondary | ICD-10-CM

## 2023-08-17 LAB — CBC WITH DIFFERENTIAL/PLATELET
Abs Immature Granulocytes: 0.02 10*3/uL (ref 0.00–0.07)
Basophils Absolute: 0 10*3/uL (ref 0.0–0.1)
Basophils Relative: 0 %
Eosinophils Absolute: 0 10*3/uL (ref 0.0–0.5)
Eosinophils Relative: 1 %
HCT: 35.4 % — ABNORMAL LOW (ref 36.0–46.0)
Hemoglobin: 10.8 g/dL — ABNORMAL LOW (ref 12.0–15.0)
Immature Granulocytes: 0 %
Lymphocytes Relative: 18 %
Lymphs Abs: 1 10*3/uL (ref 0.7–4.0)
MCH: 23.9 pg — ABNORMAL LOW (ref 26.0–34.0)
MCHC: 30.5 g/dL (ref 30.0–36.0)
MCV: 78.5 fL — ABNORMAL LOW (ref 80.0–100.0)
Monocytes Absolute: 0.2 10*3/uL (ref 0.1–1.0)
Monocytes Relative: 3 %
Neutro Abs: 4.1 10*3/uL (ref 1.7–7.7)
Neutrophils Relative %: 78 %
Platelets: 165 10*3/uL (ref 150–400)
RBC: 4.51 MIL/uL (ref 3.87–5.11)
RDW: 17.3 % — ABNORMAL HIGH (ref 11.5–15.5)
WBC: 5.4 10*3/uL (ref 4.0–10.5)
nRBC: 0 % (ref 0.0–0.2)

## 2023-08-17 LAB — URINALYSIS, ROUTINE W REFLEX MICROSCOPIC
Bacteria, UA: NONE SEEN
Bilirubin Urine: NEGATIVE
Glucose, UA: NEGATIVE mg/dL
Ketones, ur: NEGATIVE mg/dL
Leukocytes,Ua: NEGATIVE
Nitrite: NEGATIVE
Protein, ur: NEGATIVE mg/dL
Specific Gravity, Urine: 1.018 (ref 1.005–1.030)
pH: 6.5 (ref 5.0–8.0)

## 2023-08-17 LAB — COMPREHENSIVE METABOLIC PANEL WITH GFR
ALT: 22 U/L (ref 0–44)
AST: 32 U/L (ref 15–41)
Albumin: 4.2 g/dL (ref 3.5–5.0)
Alkaline Phosphatase: 56 U/L (ref 38–126)
Anion gap: 8 (ref 5–15)
BUN: 12 mg/dL (ref 6–20)
CO2: 25 mmol/L (ref 22–32)
Calcium: 9.1 mg/dL (ref 8.9–10.3)
Chloride: 104 mmol/L (ref 98–111)
Creatinine, Ser: 0.93 mg/dL (ref 0.44–1.00)
GFR, Estimated: >60 mL/min (ref >60)
Glucose, Bld: 94 mg/dL (ref 70–99)
Potassium: 4.3 mmol/L (ref 3.5–5.1)
Sodium: 137 mmol/L (ref 135–145)
Total Bilirubin: 0.4 mg/dL (ref 0.0–1.2)
Total Protein: 7.8 g/dL (ref 6.5–8.1)

## 2023-08-17 LAB — LIPASE, BLOOD: Lipase: 15 U/L (ref 11–51)

## 2023-08-17 LAB — PREGNANCY, URINE: Preg Test, Ur: NEGATIVE

## 2023-08-17 LAB — OCCULT BLOOD X 1 CARD TO LAB, STOOL: Fecal Occult Bld: POSITIVE — AB

## 2023-08-17 LAB — HCG, QUANTITATIVE, PREGNANCY: hCG, Beta Chain, Quant, S: <1 m[IU]/mL (ref <5)

## 2023-08-17 MED ORDER — FENTANYL CITRATE PF 50 MCG/ML IJ SOSY
50.0000 ug | PREFILLED_SYRINGE | Freq: Once | INTRAMUSCULAR | Status: AC
  Administered 2023-08-17: 50 ug via INTRAVENOUS
  Filled 2023-08-17: qty 1

## 2023-08-17 MED ORDER — IOHEXOL 300 MG/ML  SOLN
100.0000 mL | Freq: Once | INTRAMUSCULAR | Status: AC | PRN
  Administered 2023-08-17: 100 mL via INTRAVENOUS

## 2023-08-17 MED ORDER — ONDANSETRON HCL 4 MG/2ML IJ SOLN
4.0000 mg | Freq: Once | INTRAMUSCULAR | Status: AC
  Administered 2023-08-17: 4 mg via INTRAVENOUS
  Filled 2023-08-17: qty 2

## 2023-08-17 NOTE — ED Notes (Signed)
 EPD and RN explained to pt risks of leaving before CT scan results, pt states she understands the risk and still wants to leave AMA. Pt signed AMA form.

## 2023-08-17 NOTE — ED Provider Notes (Signed)
  Physical Exam  BP 108/80 (BP Location: Left Arm)   Pulse 88   Temp 98.5 F (36.9 C) (Oral)   Resp 17   Ht 5\' 4"  (1.626 m)   Wt 92.1 kg   LMP 08/13/2023 (Exact Date)   SpO2 100%   BMI 34.84 kg/m   Physical Exam  Procedures  Procedures  ED Course / MDM   Clinical Course as of 08/17/23 1658  Tue Aug 17, 2023  1240 Fecal Occult Blood, POC(!): POSITIVE [JL]  1501 Assumed care from Dr. Karene Fry.  38 year old female with a history of Crohns disease who is presenting with rectal pain after giving herself an enema over the weekend.  No gross blood but was Hemoccult positive. Colo in December showed rectal/anal fissure. Getting CT scan. Awaiting results now. If feeling better can likely be dc'd.  [RP]  1656 Patient is requesting to leave prior to her CT scan resulting so that she is able to pick up her son.  She does understand that this is AGAINST MEDICAL ADVICE.  They have adequate capacity to make medical decisions at this time. The risks have been explained to the patient, including worsening illness, chronic pain, permanent disability and death. The patient was able to understand and state the risks and benefits of waiting for her CT results and they had the opportunity to ask questions. The patient was treated to the extent that they would allow and knows that they may return for care at any time. Will dc with PCP and GI follow-up.   [RP]    Clinical Course User Index [JL] Ernie Avena, MD [RP] Rondel Baton, MD   Medical Decision Making Amount and/or Complexity of Data Reviewed Labs: ordered. Decision-making details documented in ED Course. Radiology: ordered.  Risk Prescription drug management.     Rondel Baton, MD 08/17/23 [phone]

## 2023-08-17 NOTE — ED Provider Notes (Signed)
 [facility] EMERGENCY DEPARTMENT AT [facility] Provider Note   CSN: [id number] Arrival date & time: 08/17/23  1108     History  No chief complaint on file.   Morgan Chung is a 38 y.o. female.  HPI   38 year old female with medical history significant for GERD, gastritis, asthma, hypertension, Crohn's disease on mercaptopurine who follows outpatient with gastroenterology at [facility] presenting to the emerged part with rectal pain.  The patient states that she most recently underwent a colonoscopy in December 2024 during which time she had no significant lesions on her colonoscopy other than a likely rectal fissure.  Underwent an enema on Sunday.  She has had no fevers or chills.  For the last 2 days she has had worsening severe rectal pain that radiates up into her left lower quadrant of her abdomen.  She denies any nausea, vomiting, diarrhea.  She had some bright red blood in her stool yesterday that was on the top of her stool, not mixed in.  No genitourinary symptoms.  Home Medications Prior to Admission medications   Medication Sig Start Date End Date Taking? Authorizing Provider  AMLODIPINE BENZOATE PO Take 10 mg by mouth. Patient not taking: Reported on 06/07/2023    [provider]  azithromycin (ZITHROMAX) 250 MG tablet Take 2 tablets on day 1, then 1 tablet daily on days 2 through 5 02/27/23   Gandhi, Safal, PA-C  benzonatate (TESSALON) 100 MG capsule Take 1 capsule (100 mg total) by mouth 3 (three) times daily as needed for cough. 03/26/22   Waldon Merl, PA-C  cromolyn (OPTICROM) 4 % ophthalmic solution Place 1 drop into both eyes 4 (four) times daily as needed (itchy/watery eyes). Patient not taking: Reported on 03/24/2022 03/04/22   Ellamae Sia, DO  dicyclomine (BENTYL) 20 MG tablet Take 1 tablet (20 mg total) by mouth 2 (two) times daily. 01/12/23   Sloan Leiter, DO  hydrochlorothiazide (HYDRODIURIL) 25 MG tablet Take 25 mg by mouth daily. 11/02/22   [provider]  HYDROCHLOROTHIAZIDE PO Take by mouth.    [provider]  mercaptopurine (PURINETHOL) 50 MG tablet Take by mouth. 01/10/23   [provider]  MERCAPTOPURINE PO Take by mouth.    [provider]  montelukast (SINGULAIR) 10 MG tablet Take 1 tablet (10 mg total) by mouth at bedtime. 03/04/22   Ellamae Sia, DO  omeprazole (PRILOSEC) 40 MG capsule Take 1 capsule by mouth daily. 01/07/23   [provider]  ondansetron (ZOFRAN) 4 MG tablet Take 1 tablet (4 mg total) by mouth every 4 (four) hours as needed for nausea or vomiting. 01/12/23   Sloan Leiter, DO  predniSONE (STERAPRED UNI-PAK 21 TAB) 10 MG (21) TBPK tablet 6 day taper; take as directed on package instructions 07/22/23   Delorse Lek, FNP  PROAIR HFA 108 [phone] Base) MCG/ACT inhaler Inhale 2 puffs into the lungs every 4 (four) hours as needed for wheezing or shortness of breath (ex-induced). 06/11/20   [provider]  sucralfate (CARAFATE) 1 g tablet Take 1 tablet (1 g total) by mouth with breakfast, with lunch, and with evening meal for 7 days. 01/12/23 01/19/23  Sloan Leiter, DO  SYMBICORT 160-4.5 MCG/ACT inhaler Inhale 1 puff into the lungs 2 (two) times daily. Patient not taking: Reported on 06/07/2023 01/23/22   [provider]  zolpidem (AMBIEN) 10 MG tablet Take 10 mg by mouth daily. 02/17/22   [provider]  Allergies    Banana, Other, and Latex    Review of Systems   Review of Systems  All other systems reviewed and are negative.   Physical Exam Updated Vital Signs BP 128/89 (BP Location: Right Arm)   Pulse (!) 103   Temp 98.5 F (36.9 C) (Oral)   Resp 17   Ht 5\' 4"  (1.626 m)   Wt 92.1 kg   LMP 08/13/2023 (Exact Date)   SpO2 98%   BMI 34.84 kg/m  Physical Exam Vitals and nursing note reviewed. Exam conducted with a chaperone present.  Constitutional:      General: She is not in acute distress. HENT:     Head: Normocephalic and atraumatic.   Eyes:     Conjunctiva/sclera: Conjunctivae normal.     Pupils: Pupils are equal, round, and reactive to light.  Cardiovascular:     Rate and Rhythm: Normal rate and regular rhythm.  Pulmonary:     Effort: Pulmonary effort is normal. No respiratory distress.  Abdominal:     General: There is no distension.     Tenderness: There is no guarding.  Genitourinary:    Rectum: Guaiac result positive.     Comments: Rectal exam performed, skin tag present anteriorly around the anus, no obvious palpable hemorrhoid, tenderness to palpation along the left rectal wall, no bright red blood per rectum noted, fecal occult positive. Musculoskeletal:        General: No deformity or signs of injury.     Cervical back: Neck supple.  Skin:    Findings: No lesion or rash.  Neurological:     General: No focal deficit present.     Mental Status: She is alert. Mental status is at baseline.     ED Results / Procedures / Treatments   Labs (all labs ordered are listed, but only abnormal results are displayed) Labs Reviewed - No data to display  EKG None  Radiology No results found.  Procedures Procedures    Medications Ordered in ED Medications - No data to display  ED Course/ Medical Decision Making/ A&P Clinical Course as of 08/17/23 1427  Tue Aug 17, 2023  1240 Fecal Occult Blood, POC(!): POSITIVE [JL]    Clinical Course User Index [JL] Ernie Avena, MD                                 Medical Decision Making Amount and/or Complexity of Data Reviewed Labs: ordered. Decision-making details documented in ED Course. Radiology: ordered.  Risk Prescription drug management.    38 year old female with medical history significant for GERD, gastritis, asthma, hypertension, Crohn's disease on mercaptopurine who follows outpatient with gastroenterology at [facility] presenting to the emerged part with rectal pain.  The patient states that she most recently underwent a colonoscopy in December 2024  during which time she had no significant lesions on her colonoscopy other than a likely rectal fissure.  Underwent an enema on Sunday.  She has had no fevers or chills.  For the last 2 days she has had worsening severe rectal pain that radiates up into her left lower quadrant of her abdomen.  She denies any nausea, vomiting, diarrhea.  She had some bright red blood in her stool yesterday that was on the top of her stool, not mixed in.  No genitourinary symptoms.  On arrival, the patient was afebrile, initially tachycardic heart rate 103, improved with pain control, BP 128/89, saturating well  on room air, not tachypneic.  Physical exam revealed tenderness of the rectum, no significant bogginess, no discharge, fecal occult positive, concern for rectal/anal fissure.  CBC without a leukocytosis, anemia present to 10.8, lipase normal, CMP unremarkable.  Plan to obtain CT imaging, urine studies and hCG pending at time of signout.  CT imaging to evaluate for possible fistula versus other complication of her Crohn's disease.  Ultimate disposition pending results of reassessment, diagnostic testing and adequate pain control. Signout given to Dr. Jarold Motto at 1500.    Final Clinical Impression(s) / ED Diagnoses Final diagnoses:  None    Rx / DC Orders ED Discharge Orders     None         Ernie Avena, MD 08/17/23 1428

## 2023-08-17 NOTE — ED Triage Notes (Signed)
 Pt POV from home c/o rectal pain since Sunday. Hx crohn's disease. Pt c/o LLQ abd pain. Denies NVD. NAD during triage

## 2023-08-17 NOTE — Discharge Instructions (Signed)
 You were seen for your rectal pain in the emergency department.   At home, please take stool softeners such as MiraLAX to try and reduce the pain that you are having.    Check your MyChart online for the results of any tests that had not resulted by the time you left the emergency department.   Call your primary doctor tomorrow about the results of your CT scan.  Please also call your GI doctor soon as possible about that as well.  Return immediately to the emergency department if you experience any of the following: Worsening pain, fevers, vomiting, or any other concerning symptoms.    Thank you for visiting our Emergency Department. It was a pleasure taking care of you today.

## 2023-08-28 ENCOUNTER — Telehealth: Admitting: Family

## 2023-08-28 DIAGNOSIS — K602 Anal fissure, unspecified: Secondary | ICD-10-CM

## 2023-08-28 DIAGNOSIS — K6289 Other specified diseases of anus and rectum: Secondary | ICD-10-CM | POA: Diagnosis not present

## 2023-08-28 MED ORDER — MESALAMINE 1000 MG RE SUPP: 1000.0000 mg | Freq: Every day | RECTAL | 12 refills | Status: AC

## 2023-08-28 MED ORDER — LIDOCAINE 5 % EX OINT: 1.0000 | TOPICAL_OINTMENT | CUTANEOUS | 0 refills | Status: DC | PRN

## 2023-08-28 NOTE — Progress Notes (Signed)
 Virtual Visit Consent   Morgan Chung, you are scheduled for a virtual visit with a [facility] provider today. Just as with appointments in the office, your consent must be obtained to participate. Your consent will be active for this visit and any virtual visit you may have with one of our providers in the next 365 days. If you have a MyChart account, a copy of this consent can be sent to you electronically.  As this is a virtual visit, video technology does not allow for your provider to perform a traditional examination. This may limit your provider's ability to fully assess your condition. If your provider identifies any concerns that need to be evaluated in person or the need to arrange testing (such as labs, EKG, etc.), we will make arrangements to do so. Although advances in technology are sophisticated, we cannot ensure that it will always work on either your end or our end. If the connection with a video visit is poor, the visit may have to be switched to a telephone visit. With either a video or telephone visit, we are not always able to ensure that we have a secure connection.  By engaging in this virtual visit, you consent to the provision of healthcare and authorize for your insurance to be billed (if applicable) for the services provided during this visit. Depending on your insurance coverage, you may receive a charge related to this service.  I need to obtain your verbal consent now. Are you willing to proceed with your visit today? Shanquita Ronning has provided verbal consent on 08/28/2023 for a virtual visit (video or telephone). Tommas Fragmin, FNP  Date: 08/28/2023 6:29 PM   Virtual Visit via Video Note   I, Tommas Fragmin, connected with  Morgan Chung  ([id number], Oct 07, 1985) on 08/28/23 at  6:30 PM EDT by a video-enabled telemedicine application and verified that I am speaking with the correct person using two identifiers.  Location: Patient: Virtual Visit Location Patient: Home Provider:  Virtual Visit Location Provider: Home Office   I discussed the limitations of evaluation and management by telemedicine and the availability of in person appointments. The patient expressed understanding and agreed to proceed.    History of Present Illness: Morgan Chung is a 38 y.o. who identifies as a female who was assigned female at birth, and is being seen today for anal pain. She has Crohns disease and went to the ED on 08/17/23 and diagnosed with anal fissure and proctitis . She was given oral prednisone. She reports she is on day 8 and has continued to have sore pain of 8 out 10. Denies any blood. Has a follow up with her GI April 29. He has used mesalamine suppositories in the past with mild relief.   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   Rash and other nonspecific skin eruption 03/04/2022   Other adverse food reactions, not elsewhere classified, subsequent encounter 03/04/2022   Bowel obstruction (HCC) 12/26/2020   Constipation 08/23/2020   Nausea and vomiting 08/23/2020   Abdominal pain 08/07/2020   Insomnia 08/07/2020   Microcytic anemia 08/07/2020   Crohn's disease of small intestine with complication (HCC)     Allergies:  Allergies  Allergen Reactions   Banana Itching, Swelling and Other (See Comments)    Mouth itches and  swells   Other Itching, Swelling and Other (See Comments)    (Fruit) MELON MIX- Mouth itches and swells   Latex Rash   Medications:  Current Outpatient Medications:    lidocaine (XYLOCAINE) 5 % ointment, Apply 1 Application topically as needed., Disp: 35.44 g, Rfl: 0   mesalamine (CANASA) 1000 MG suppository, Place 1 suppository (1,000 mg total) rectally at bedtime., Disp: 30 suppository, Rfl: 12   AMLODIPINE BENZOATE PO, Take 10 mg by  mouth. (Patient not taking: Reported on 06/07/2023), Disp: , Rfl:    cromolyn (OPTICROM) 4 % ophthalmic solution, Place 1 drop into both eyes 4 (four) times daily as needed (itchy/watery eyes). (Patient not taking: Reported on 03/24/2022), Disp: 10 mL, Rfl: 3   hydrochlorothiazide (HYDRODIURIL) 25 MG tablet, Take 25 mg by mouth daily., Disp: , Rfl:    mercaptopurine (PURINETHOL) 50 MG tablet, Take by mouth., Disp: , Rfl:    omeprazole (PRILOSEC) 40 MG capsule, Take 1 capsule by mouth daily., Disp: , Rfl:    ondansetron (ZOFRAN) 4 MG tablet, Take 1 tablet (4 mg total) by mouth every 4 (four) hours as needed for nausea or vomiting., Disp: 12 tablet, Rfl: 0   predniSONE (STERAPRED UNI-PAK 21 TAB) 10 MG (21) TBPK tablet, 6 day taper; take as directed on package instructions, Disp: 21 tablet, Rfl: 0   PROAIR HFA 108 (90 Base) MCG/ACT inhaler, Inhale 2 puffs into the lungs every 4 (four) hours as needed for wheezing or shortness of breath (ex-induced)., Disp: , Rfl:    sucralfate (CARAFATE) 1 g tablet, Take 1 tablet (1 g total) by mouth with breakfast, with lunch, and with evening meal for 7 days., Disp: 21 tablet, Rfl: 0   SYMBICORT 160-4.5 MCG/ACT inhaler, Inhale 1 puff into the lungs 2 (two) times daily. (Patient not taking: Reported on 06/07/2023), Disp: , Rfl:    zolpidem (AMBIEN) 10 MG tablet, Take 10 mg by mouth daily., Disp: , Rfl:   Observations/Objective: Patient is well-developed, well-nourished in no acute distress.  Resting comfortably at home.  Head is normocephalic, atraumatic.  No labored breathing.  Speech is clear and coherent with logical content.  Patient is alert and oriented at baseline.   Assessment and Plan: 1. Anal fissure (Primary) - lidocaine (XYLOCAINE) 5 % ointment; Apply 1 Application topically as needed.  Dispense: 35.44 g; Refill: 0  2. Proctitis - mesalamine (CANASA) 1000 MG suppository; Place 1 suppository (1,000 mg total) rectally at bedtime.  Dispense: 30  suppository; Refill: 12  Keep GI follow up Lidocaine not internal Avoid straining during BM Follow up if symptoms worsen or do not improve   Follow Up Instructions: I discussed the assessment and treatment plan with the patient. The patient was provided an opportunity to ask questions and all were answered. The patient agreed with the plan and demonstrated an understanding of the instructions.  A copy of instructions were sent to the patient via MyChart unless otherwise noted below.     The patient was advised to call back or seek an in-person evaluation if the symptoms worsen or if the condition fails to improve as anticipated.    Tommas Fragmin, FNP

## 2023-08-28 NOTE — Patient Instructions (Signed)
 Proctitis  Proctitis is inflammation of the lining of the rectum. The rectum is at the end of the large intestine. The inflammation can cause pain and discomfort. It may be short-term and sudden (acute) or a long-term (chronic) problem. What are the causes? Proctitis may be caused by: Sexually transmitted infections (STIs). Infection. Trauma or injury to the rectum or to the opening of the butt (anus). Ulcerative colitis or Crohn's disease. Radiation therapy near the rectum. Antibiotics. What are the signs or symptoms? Symptoms of this condition include: The sudden need to poop. This may be uncomfortable. It may happen often. Pain in the anus or rectum. Bleeding from the rectum. Pain or cramping in the abdomen. Feeling like your rectum is full. Pus or mucus coming from the anus. Diarrhea or lots of soft, loose poop (stool). Constipation or pain when pooping. How is this diagnosed? This condition may be diagnosed based on your medical history and a physical exam. You may also have tests, such as: A test to check for an STI. Blood tests. Poop tests. A culture test. This is when a sample is taken from your rectum to look for bacteria. Anoscopy. This is a procedure to look at the anal canal. Colonoscopy or sigmoidoscopy. These are procedures to look at the large intestine. How is this treated? Treatment for proctitis depends on the cause. Treatment may include: Medicines to: Treat inflammation. These may be ointments or foams you can put on your rectum. They may also be enemas or medicines you can put in your rectum (suppositories). Treat infection or fight bacteria, such as antibiotics. Fight a virus. These medicines are called antivirals. Help with diarrhea, hard poop, and pain. Reduce the activity of the body's disease-fighting system (immune system). Supplements. Staying away from the activity that caused trauma to the rectum. Heat or laser therapy. This may be used if  bleeding does not go away. A procedure to make a narrow rectum wider. Surgery to fix the lining of the rectum. This is rare. If the proctitis was caused by an STI, you may need to get tested again for infection 3 months after treatment. Follow these instructions at home: Medicines Take over-the-counter and prescription medicines only as told by your health care provider. If you were prescribed antibiotics, take them as told by your provider. Do not stop using the antibiotic even if you start to feel better. Managing constipation Proctitis may cause constipation. To prevent or treat constipation, you may need to: Drink enough fluid to keep your pee (urine) pale yellow. Take over-the-counter or prescription medicines. Eat foods that are high in fiber, such as beans, whole grains, and fresh fruits and vegetables. Limit foods that are high in fat and processed sugars, such as fried or sweet foods. General instructions  Take sitz baths as told by your provider. A sitz bath is a shallow, warm water bath that you sit down in. The water should only come up to your hips and should cover your butt. Try not to eat right before bed. Keep all follow-up visits. Your provider will need to make sure your condition is improving. Contact a health care provider if: Your symptoms do not get better with treatment. Your symptoms get worse. You have a fever. Get help right away if: You have blood in your poop. There is blood coming from your rectum. This information is not intended to replace advice given to you by your health care provider. Make sure you discuss any questions you have with your health  care provider. Document Revised: 12/22/2021 Document Reviewed: 12/22/2021 Elsevier Patient Education  2024 [employer].

## 2023-08-31 ENCOUNTER — Other Ambulatory Visit: Payer: Self-pay

## 2023-08-31 ENCOUNTER — Emergency Department (HOSPITAL_BASED_OUTPATIENT_CLINIC_OR_DEPARTMENT_OTHER)

## 2023-08-31 ENCOUNTER — Emergency Department (HOSPITAL_BASED_OUTPATIENT_CLINIC_OR_DEPARTMENT_OTHER)
Admission: EM | Admit: 2023-08-31 | Discharge: 2023-08-31 | Disposition: A | Discharge status: Home / Self Care | Attending: Emergency Medicine | Admitting: Emergency Medicine

## 2023-08-31 ENCOUNTER — Encounter (HOSPITAL_BASED_OUTPATIENT_CLINIC_OR_DEPARTMENT_OTHER): Payer: Self-pay

## 2023-08-31 DIAGNOSIS — K8020 Calculus of gallbladder without cholecystitis without obstruction: Secondary | ICD-10-CM | POA: Diagnosis not present

## 2023-08-31 DIAGNOSIS — I10 Essential (primary) hypertension: Secondary | ICD-10-CM | POA: Insufficient documentation

## 2023-08-31 DIAGNOSIS — K5090 Crohn's disease, unspecified, without complications: Secondary | ICD-10-CM | POA: Diagnosis not present

## 2023-08-31 DIAGNOSIS — Z79899 Other long term (current) drug therapy: Secondary | ICD-10-CM | POA: Diagnosis not present

## 2023-08-31 DIAGNOSIS — J45909 Unspecified asthma, uncomplicated: Secondary | ICD-10-CM | POA: Diagnosis not present

## 2023-08-31 DIAGNOSIS — R1084 Generalized abdominal pain: Secondary | ICD-10-CM | POA: Diagnosis present

## 2023-08-31 DIAGNOSIS — K5900 Constipation, unspecified: Secondary | ICD-10-CM | POA: Diagnosis not present

## 2023-08-31 LAB — CBC WITH DIFFERENTIAL/PLATELET
Abs Immature Granulocytes: 0.13 10*3/uL — ABNORMAL HIGH (ref 0.00–0.07)
Basophils Absolute: 0 10*3/uL (ref 0.0–0.1)
Basophils Relative: 0 %
Eosinophils Absolute: 0 10*3/uL (ref 0.0–0.5)
Eosinophils Relative: 0 %
HCT: 39.2 % (ref 36.0–46.0)
Hemoglobin: 12 g/dL (ref 12.0–15.0)
Immature Granulocytes: 1 %
Lymphocytes Relative: 10 %
Lymphs Abs: 1.5 10*3/uL (ref 0.7–4.0)
MCH: 24.1 pg — ABNORMAL LOW (ref 26.0–34.0)
MCHC: 30.6 g/dL (ref 30.0–36.0)
MCV: 78.9 fL — ABNORMAL LOW (ref 80.0–100.0)
Monocytes Absolute: 0.4 10*3/uL (ref 0.1–1.0)
Monocytes Relative: 3 %
Neutro Abs: 13.7 10*3/uL — ABNORMAL HIGH (ref 1.7–7.7)
Neutrophils Relative %: 86 %
Platelets: 365 10*3/uL (ref 150–400)
RBC: 4.97 MIL/uL (ref 3.87–5.11)
RDW: 17.8 % — ABNORMAL HIGH (ref 11.5–15.5)
WBC: 15.7 10*3/uL — ABNORMAL HIGH (ref 4.0–10.5)
nRBC: 0 % (ref 0.0–0.2)

## 2023-08-31 LAB — COMPREHENSIVE METABOLIC PANEL WITH GFR
ALT: 47 U/L — ABNORMAL HIGH (ref 0–44)
AST: 19 U/L (ref 15–41)
Albumin: 4.7 g/dL (ref 3.5–5.0)
Alkaline Phosphatase: 58 U/L (ref 38–126)
Anion gap: 9 (ref 5–15)
BUN: 16 mg/dL (ref 6–20)
CO2: 25 mmol/L (ref 22–32)
Calcium: 9.8 mg/dL (ref 8.9–10.3)
Chloride: 101 mmol/L (ref 98–111)
Creatinine, Ser: 0.95 mg/dL (ref 0.44–1.00)
GFR, Estimated: >60 mL/min (ref >60)
Glucose, Bld: 85 mg/dL (ref 70–99)
Potassium: 3.9 mmol/L (ref 3.5–5.1)
Sodium: 135 mmol/L (ref 135–145)
Total Bilirubin: 0.4 mg/dL (ref 0.0–1.2)
Total Protein: 8 g/dL (ref 6.5–8.1)

## 2023-08-31 LAB — URINALYSIS, ROUTINE W REFLEX MICROSCOPIC
Bacteria, UA: NONE SEEN
Bilirubin Urine: NEGATIVE
Glucose, UA: NEGATIVE mg/dL
Hgb urine dipstick: NEGATIVE
Ketones, ur: NEGATIVE mg/dL
Leukocytes,Ua: NEGATIVE
Nitrite: NEGATIVE
Specific Gravity, Urine: 1.031 — ABNORMAL HIGH (ref 1.005–1.030)
pH: 6.5 (ref 5.0–8.0)

## 2023-08-31 LAB — PREGNANCY, URINE: Preg Test, Ur: NEGATIVE

## 2023-08-31 LAB — LIPASE, BLOOD: Lipase: 15 U/L (ref 11–51)

## 2023-08-31 MED ORDER — DICYCLOMINE HCL 20 MG PO TABS: 20.0000 mg | ORAL_TABLET | Freq: Two times a day (BID) | ORAL | 0 refills | Status: DC

## 2023-08-31 MED ORDER — LACTATED RINGERS IV BOLUS
1000.0000 mL | Freq: Once | INTRAVENOUS | Status: AC
  Administered 2023-08-31: 1000 mL via INTRAVENOUS

## 2023-08-31 MED ORDER — IOHEXOL 300 MG/ML  SOLN
100.0000 mL | Freq: Once | INTRAMUSCULAR | Status: AC | PRN
  Administered 2023-08-31: 85 mL via INTRAVENOUS

## 2023-08-31 MED ORDER — DOCUSATE SODIUM 100 MG PO CAPS: 100.0000 mg | ORAL_CAPSULE | Freq: Two times a day (BID) | ORAL | 0 refills | Status: DC

## 2023-08-31 MED ORDER — ONDANSETRON HCL 4 MG/2ML IJ SOLN
4.0000 mg | Freq: Once | INTRAMUSCULAR | Status: AC
  Administered 2023-08-31: 4 mg via INTRAVENOUS
  Filled 2023-08-31: qty 2

## 2023-08-31 MED ORDER — PANTOPRAZOLE SODIUM 40 MG PO TBEC: 40.0000 mg | DELAYED_RELEASE_TABLET | Freq: Every day | ORAL | 0 refills | Status: DC

## 2023-08-31 MED ORDER — FENTANYL CITRATE PF 50 MCG/ML IJ SOSY: 50.0000 ug | PREFILLED_SYRINGE | INTRAMUSCULAR | Status: DC | PRN

## 2023-08-31 MED ORDER — FAMOTIDINE 20 MG PO TABS
20.0000 mg | ORAL_TABLET | Freq: Every day | ORAL | 0 refills | Status: DC
Start: 2023-08-31 — End: 2024-04-11

## 2023-08-31 MED ORDER — MORPHINE SULFATE (PF) 4 MG/ML IV SOLN
4.0000 mg | Freq: Once | INTRAVENOUS | Status: AC
  Administered 2023-08-31: 4 mg via INTRAVENOUS
  Filled 2023-08-31: qty 1

## 2023-08-31 NOTE — ED Provider Notes (Signed)
 [place] EMERGENCY DEPARTMENT AT [facility] Provider Note   CSN: [id number] Arrival date & time: 08/31/23  0841     History  Chief Complaint  Patient presents with   Abdominal Pain    Morgan Chung is a 38 y.o. female.   Abdominal Pain Associated symptoms: constipation, nausea and vomiting      38 year old female with medical history significant for GERD, gastritis, asthma, hypertension, Crohn's disease on mercaptopurine who follows outpatient with gastroenterology at [facility] who presents to the Emergency Department with decreased stool output and abdominal pain.  The patient states that she has had no rectal bleeding.  She was last seen in the emergency department by myself on 08/17/2023.  She was found to have a mild anal fissure and was discharged on MiraLAX.  She has been doing the MiraLAX but states that she has had decreased bowel movements.  Her last bowel movement was 4 days ago.  She feels like she is not passing gas.  She endorses generalized abdominal discomfort, some nausea and vomiting that is NBNB. She denies any rectal bleeding or blood in her stool.  Home Medications Prior to Admission medications   Medication Sig Start Date End Date Taking? Authorizing Provider  clindamycin (CLEOCIN) 300 MG capsule Take 300 mg by mouth 3 (three) times daily. 08/18/23  Yes [provider]  hydrocortisone (ANUSOL-HC) 2.5 % rectal cream Place 1 Application rectally See admin instructions. *2-4 times daily as needed* 08/18/23  Yes [provider]  methocarbamol (ROBAXIN) 500 MG tablet Take 1,000 mg by mouth 4 (four) times daily. 08/18/23  Yes [provider]  predniSONE (STERAPRED UNI-PAK 48 TAB) 10 MG (48) TBPK tablet Take by mouth as directed. 08/18/23  Yes [provider]  WEGOVY 1 MG/0.5ML SOAJ Inject 1 mg into the skin once a week. 08/13/23  Yes [provider]  hydrochlorothiazide (HYDRODIURIL) 25 MG tablet Take 25 mg by mouth daily. 11/02/22    [provider]  lidocaine (XYLOCAINE) 5 % ointment Apply 1 Application topically as needed. 08/28/23   Junie Spencer, FNP  mercaptopurine (PURINETHOL) 50 MG tablet Take by mouth. 01/10/23   [provider]  mesalamine (CANASA) 1000 MG suppository Place 1 suppository (1,000 mg total) rectally at bedtime. 08/28/23   Junie Spencer, FNP  ondansetron (ZOFRAN) 4 MG tablet Take 1 tablet (4 mg total) by mouth every 4 (four) hours as needed for nausea or vomiting. 01/12/23   Sloan Leiter, DO  predniSONE (STERAPRED UNI-PAK 21 TAB) 10 MG (21) TBPK tablet 6 day taper; take as directed on package instructions 07/22/23   Delorse Lek, FNP  PROAIR HFA 108 [phone] Base) MCG/ACT inhaler Inhale 2 puffs into the lungs every 4 (four) hours as needed for wheezing or shortness of breath (ex-induced). 06/11/20   [provider]  zolpidem (AMBIEN) 10 MG tablet Take 10 mg by mouth daily. 02/17/22   [provider]      Allergies    Banana, Other, and Latex    Review of Systems   Review of Systems  Gastrointestinal:  Positive for abdominal pain, constipation, nausea and vomiting.  All other systems reviewed and are negative.   Physical Exam Updated Vital Signs BP (!) 139/98 (BP Location: Left Arm)   Pulse 93   Temp 97.9 F (36.6 C)   Resp 16   Ht 5\' 4"  (1.626 m)   Wt 92.1 kg   LMP 08/13/2023 (Exact Date)   SpO2 99%   BMI  34.84 kg/m  Physical Exam Vitals and nursing note reviewed.  Constitutional:      General: She is not in acute distress.    Appearance: She is well-developed.  HENT:     Head: Normocephalic and atraumatic.  Eyes:     Conjunctiva/sclera: Conjunctivae normal.  Cardiovascular:     Rate and Rhythm: Normal rate and regular rhythm.  Pulmonary:     Effort: Pulmonary effort is normal. No respiratory distress.     Breath sounds: Normal breath sounds.  Abdominal:     General: There is distension.     Palpations: Abdomen is soft.     Tenderness: There  is generalized abdominal tenderness.  Musculoskeletal:        General: No swelling.     Cervical back: Neck supple.  Skin:    General: Skin is warm and dry.     Capillary Refill: Capillary refill takes less than 2 seconds.  Neurological:     Mental Status: She is alert.  Psychiatric:        Mood and Affect: Mood normal.     ED Results / Procedures / Treatments   Labs (all labs ordered are listed, but only abnormal results are displayed) Labs Reviewed - No data to display  EKG None  Radiology No results found.  Procedures Procedures    Medications Ordered in ED Medications - No data to display  ED Course/ Medical Decision Making/ A&P Clinical Course as of 08/31/23 1444  Tue Aug 31, 2023  1131 WBC(!): 15.7 [JL]    Clinical Course User Index [JL] Ernie Avena, MD                                 Medical Decision Making Amount and/or Complexity of Data Reviewed Labs: ordered. Decision-making details documented in ED Course. Radiology: ordered.  Risk Prescription drug management.     38 year old female with medical history significant for GERD, gastritis, asthma, hypertension, Crohn's disease on mercaptopurine who follows outpatient with gastroenterology at [facility] who presents to the Emergency Department with decreased stool output and abdominal pain.  The patient states that she has had no rectal bleeding.  She was last seen in the emergency department by myself on 08/17/2023.  She was found to have a mild anal fissure and was discharged on MiraLAX.  She has been doing the MiraLAX but states that she has had decreased bowel movements.  Her last bowel movement was 4 days ago.  She feels like she is not passing gas.  She endorses generalized abdominal discomfort, some nausea and vomiting that is NBNB.  On arrival, the patient was afebrile, not tachycardic or tachypneic, saturating well on room air, BP 139/98.  Physical exam revealed generalized abdominal tenderness to  palpation, some distention present.  Differential diagnosis includes small bowel obstruction, Crohn's flare, constipation, other acute intra-abdominal abnormality felt to be less likely such as diverticulitis, pancreatitis, cholelithiasis/cholecystitis, appendicitis, viral syndrome.  IV access was obtained and the patient was administered IV morphine, volume resuscitated with an IV fluid bolus, administered IV Zofran for nausea control.  Laboratory evaluation revealed a leukocytosis to 15.7 (patient is not on a steroid currently), no anemia hemoglobin 12.0, CMP without significant electrolyte abnormality, normal renal and liver function, lipase normal, urine pregnancy negative, urinalysis without evidence of hematuria or UTI.  CT abdomen pelvis with contrast was obtained, results pending at time of signout.  Plan at time of signout to  follow-up results of CT imaging, plan to p.o. challenge the patient, reassess the patient's pain, ultimate disposition pending results of CT imaging and reassessment.  Signout given to Dr. Ranelle Buys at 1500 pending results of testing and reassessment.   Final Clinical Impression(s) / ED Diagnoses Final diagnoses:  None    Rx / DC Orders ED Discharge Orders     None         Rosealee Concha, MD 08/31/23 1452

## 2023-08-31 NOTE — ED Triage Notes (Signed)
 In for eval of abd pain, nausea, and vomiting since 4 days ago. PMH crohn's disease. Last BM last pm scant amount of diarrhea. Taking Miralax for constipation.

## 2023-08-31 NOTE — ED Notes (Signed)
Pt urine sample sent to lab

## 2023-08-31 NOTE — ED Notes (Signed)
Patient is drinking oral contrast at this time. 

## 2023-08-31 NOTE — Discharge Instructions (Addendum)
 Your full CT imaging revealed the following: FINDINGS:  Lower chest: Clear lung bases. No significant pleural or pericardial  effusion.    Hepatobiliary: The liver is normal in density without suspicious  focal abnormality. Small gallstones again noted. No evidence of  gallbladder wall thickening, surrounding inflammation or biliary  ductal dilatation.    Pancreas: Unremarkable. No pancreatic ductal dilatation or  surrounding inflammatory changes.    Spleen: Normal in size without focal abnormality.    Adrenals/Urinary Tract: Both adrenal glands appear normal. No  evidence of urinary tract calculus, hydronephrosis or renal mass.  The urinary bladder appears unremarkable.    Stomach/Bowel: Enteric contrast has passed into the mid to distal  small bowel. The stomach appears unremarkable for its degree of  distention. There is no bowel distension, wall thickening or  surrounding inflammation status post terminal ileal resection and  ileocolonic anastomosis. Moderate stool throughout the colon. No  evidence of fistula or obstruction.    Vascular/Lymphatic: There are no enlarged abdominal or pelvic lymph  nodes. No significant vascular findings.    Reproductive: The uterus and ovaries appear stable. No suspicious  adnexal findings.    Other: Postsurgical changes in the anterior abdominal wall. No  significant hernia, ascites or pneumoperitoneum.    Musculoskeletal: No acute or significant osseous findings. No  evidence of sacroiliitis.    IMPRESSION:  1. No acute findings or explanation for the patient's symptoms. No  evidence of bowel obstruction or active inflammatory bowel disease.  2. Stable postsurgical changes status post terminal ileal resection  and ileocolonic anastomosis.  3. Cholelithiasis without evidence of cholecystitis or biliary  ductal dilatation.    The gallstones are likely an incidental finding as there is no tenderness on exam and this was not the  location of your pain and discomfort.  Watch for signs of worsening right upper quadrant pain, nausea and vomiting, fever or chills, decreased appetite as this could be a sign of symptomatic gallstones or infection of the gallbladder called cholecystitis.  A referral has been placed for outpatient follow-up with a general surgeon for surveillance of your gallstones.  Additionally, recommend you follow-up with a gastroenterologist regarding your symptoms.  Your symptoms of epigastric discomfort and generalized abdominal discomfort could be due to mild constipation and abdominal spasm versus gastritis or a peptic ulcer which would not be seen on CT imaging.  Will start you on Pepcid and Protonix in addition to Bentyl to try and manage your symptoms. Finally, colace has been added for your symptoms of constipation.

## 2023-08-31 NOTE — ED Provider Notes (Incomplete Revision)
 [place] EMERGENCY DEPARTMENT AT [facility] Provider Note   CSN: [id number] Arrival date & time: 08/31/23  0841     History  Chief Complaint  Patient presents with   Abdominal Pain    Morgan Chung is a 38 y.o. female.   Abdominal Pain Associated symptoms: constipation, nausea and vomiting      38 year old female with medical history significant for GERD, gastritis, asthma, hypertension, Crohn's disease on mercaptopurine who follows outpatient with gastroenterology at [facility] who presents to the Emergency Department with decreased stool output and abdominal pain.  The patient states that she has had no rectal bleeding.  She was last seen in the emergency department by myself on 08/17/2023.  She was found to have a mild anal fissure and was discharged on MiraLAX.  She has been doing the MiraLAX but states that she has had decreased bowel movements.  Her last bowel movement was 4 days ago.  She feels like she is not passing gas.  She endorses generalized abdominal discomfort, some nausea and vomiting that is NBNB. She denies any rectal bleeding or blood in her stool.  Home Medications Prior to Admission medications   Medication Sig Start Date End Date Taking? Authorizing Provider  clindamycin (CLEOCIN) 300 MG capsule Take 300 mg by mouth 3 (three) times daily. 08/18/23  Yes [provider]  hydrocortisone (ANUSOL-HC) 2.5 % rectal cream Place 1 Application rectally See admin instructions. *2-4 times daily as needed* 08/18/23  Yes [provider]  methocarbamol (ROBAXIN) 500 MG tablet Take 1,000 mg by mouth 4 (four) times daily. 08/18/23  Yes [provider]  predniSONE (STERAPRED UNI-PAK 48 TAB) 10 MG (48) TBPK tablet Take by mouth as directed. 08/18/23  Yes [provider]  WEGOVY 1 MG/0.5ML SOAJ Inject 1 mg into the skin once a week. 08/13/23  Yes [provider]  hydrochlorothiazide (HYDRODIURIL) 25 MG tablet Take 25 mg by mouth daily. 11/02/22    [provider]  lidocaine (XYLOCAINE) 5 % ointment Apply 1 Application topically as needed. 08/28/23   Junie Spencer, FNP  mercaptopurine (PURINETHOL) 50 MG tablet Take by mouth. 01/10/23   [provider]  mesalamine (CANASA) 1000 MG suppository Place 1 suppository (1,000 mg total) rectally at bedtime. 08/28/23   Junie Spencer, FNP  ondansetron (ZOFRAN) 4 MG tablet Take 1 tablet (4 mg total) by mouth every 4 (four) hours as needed for nausea or vomiting. 01/12/23   Sloan Leiter, DO  predniSONE (STERAPRED UNI-PAK 21 TAB) 10 MG (21) TBPK tablet 6 day taper; take as directed on package instructions 07/22/23   Delorse Lek, FNP  PROAIR HFA 108 [phone] Base) MCG/ACT inhaler Inhale 2 puffs into the lungs every 4 (four) hours as needed for wheezing or shortness of breath (ex-induced). 06/11/20   [provider]  zolpidem (AMBIEN) 10 MG tablet Take 10 mg by mouth daily. 02/17/22   [provider]      Allergies    Banana, Other, and Latex    Review of Systems   Review of Systems  Gastrointestinal:  Positive for abdominal pain, constipation, nausea and vomiting.  All other systems reviewed and are negative.   Physical Exam Updated Vital Signs BP (!) 139/98 (BP Location: Left Arm)   Pulse 93   Temp 97.9 F (36.6 C)   Resp 16   Ht 5\' 4"  (1.626 m)   Wt 92.1 kg   LMP 08/13/2023 (Exact Date)   SpO2 99%   BMI  34.84 kg/m  Physical Exam Vitals and nursing note reviewed.  Constitutional:      General: She is not in acute distress.    Appearance: She is well-developed.  HENT:     Head: Normocephalic and atraumatic.  Eyes:     Conjunctiva/sclera: Conjunctivae normal.  Cardiovascular:     Rate and Rhythm: Normal rate and regular rhythm.  Pulmonary:     Effort: Pulmonary effort is normal. No respiratory distress.     Breath sounds: Normal breath sounds.  Abdominal:     General: There is distension.     Palpations: Abdomen is soft.     Tenderness: There  is generalized abdominal tenderness.  Musculoskeletal:        General: No swelling.     Cervical back: Neck supple.  Skin:    General: Skin is warm and dry.     Capillary Refill: Capillary refill takes less than 2 seconds.  Neurological:     Mental Status: She is alert.  Psychiatric:        Mood and Affect: Mood normal.     ED Results / Procedures / Treatments   Labs (all labs ordered are listed, but only abnormal results are displayed) Labs Reviewed - No data to display  EKG None  Radiology No results found.  Procedures Procedures    Medications Ordered in ED Medications - No data to display  ED Course/ Medical Decision Making/ A&P Clinical Course as of 08/31/23 1444  Tue Aug 31, 2023  1131 WBC(!): 15.7 [JL]    Clinical Course User Index [JL] Ernie Avena, MD                                 Medical Decision Making Amount and/or Complexity of Data Reviewed Labs: ordered. Decision-making details documented in ED Course. Radiology: ordered.  Risk Prescription drug management.     38 year old female with medical history significant for GERD, gastritis, asthma, hypertension, Crohn's disease on mercaptopurine who follows outpatient with gastroenterology at [facility] who presents to the Emergency Department with decreased stool output and abdominal pain.  The patient states that she has had no rectal bleeding.  She was last seen in the emergency department by myself on 08/17/2023.  She was found to have a mild anal fissure and was discharged on MiraLAX.  She has been doing the MiraLAX but states that she has had decreased bowel movements.  Her last bowel movement was 4 days ago.  She feels like she is not passing gas.  She endorses generalized abdominal discomfort, some nausea and vomiting that is NBNB.  On arrival, the patient was afebrile, not tachycardic or tachypneic, saturating well on room air, BP 139/98.  Physical exam revealed generalized abdominal tenderness to  palpation, some distention present.  Differential diagnosis includes small bowel obstruction, Crohn's flare, constipation, other acute intra-abdominal abnormality felt to be less likely such as diverticulitis, pancreatitis, cholelithiasis/cholecystitis, appendicitis, viral syndrome.  IV access was obtained and the patient was administered IV morphine, volume resuscitated with an IV fluid bolus, administered IV Zofran for nausea control.  Laboratory evaluation revealed a leukocytosis to 15.7 (patient is not on a steroid currently), no anemia hemoglobin 12.0, CMP without significant electrolyte abnormality, normal renal and liver function, lipase normal, urine pregnancy negative, urinalysis without evidence of hematuria or UTI.  CT abdomen pelvis with contrast was obtained: IMPRESSION:  1. No acute findings or explanation for the patient's symptoms. No  evidence of bowel obstruction or active inflammatory bowel disease.  2. Stable postsurgical changes status post terminal ileal resection  and ileocolonic anastomosis.  3. Cholelithiasis without evidence of cholecystitis or biliary  ductal dilatation.   ***   Final Clinical Impression(s) / ED Diagnoses Final diagnoses:  None    Rx / DC Orders ED Discharge Orders     None         Rosealee Concha, MD 08/31/23 1452

## 2023-10-09 ENCOUNTER — Telehealth: Admitting: Physician Assistant

## 2023-10-09 ENCOUNTER — Telehealth

## 2023-10-09 DIAGNOSIS — K5080 Crohn's disease of both small and large intestine without complications: Secondary | ICD-10-CM

## 2023-10-09 MED ORDER — METOCLOPRAMIDE HCL 5 MG PO TABS: 5.0000 mg | ORAL_TABLET | Freq: Three times a day (TID) | ORAL | 0 refills | Status: DC

## 2023-10-09 NOTE — Progress Notes (Signed)
 Virtual Visit Consent   Morgan Chung, you are scheduled for a virtual visit with a [facility] provider today. Just as with appointments in the office, your consent must be obtained to participate. Your consent will be active for this visit and any virtual visit you may have with one of our providers in the next 365 days. If you have a MyChart account, a copy of this consent can be sent to you electronically.  As this is a virtual visit, video technology does not allow for your provider to perform a traditional examination. This may limit your provider's ability to fully assess your condition. If your provider identifies any concerns that need to be evaluated in person or the need to arrange testing (such as labs, EKG, etc.), we will make arrangements to do so. Although advances in technology are sophisticated, we cannot ensure that it will always work on either your end or our end. If the connection with a video visit is poor, the visit may have to be switched to a telephone visit. With either a video or telephone visit, we are not always able to ensure that we have a secure connection.  By engaging in this virtual visit, you consent to the provision of healthcare and authorize for your insurance to be billed (if applicable) for the services provided during this visit. Depending on your insurance coverage, you may receive a charge related to this service.  I need to obtain your verbal consent now. Are you willing to proceed with your visit today? Malcolm Hetz has provided verbal consent on 10/09/2023 for a virtual visit (video or telephone). [name], PA-C  Date: 10/09/2023 3:49 PM   Virtual Visit via Video Note   I, [name], connected with  Morgan Chung  ([id number], March 03, 1986) on 10/09/23 at  3:45 PM EDT by a video-enabled telemedicine application and verified that I am speaking with the correct person using two identifiers.  Location: Patient: Virtual Visit Location Patient:  Home Provider: Virtual Visit Location Provider: Home Office   I discussed the limitations of evaluation and management by telemedicine and the availability of in person appointments. The patient expressed understanding and agreed to proceed.    History of Present Illness: Morgan Chung is a 38 y.o. who identifies as a female who was assigned female at birth, and is being seen today for medication refill. Has Crohn's disease with constipation. She is followed by GI. They did a 14 day trial of Reglan  for her symptoms and it did offer relief. She did reach back out to her GI specialist for a refill yesterday, but the office had closed and they did not have any future appointments until June 2025. She is scheduled for a colonoscopy at the end of June as well. Since running out of Reglan  she is starting to have cramping, bloating, constipation start to return. She is requesting a refill until she can see her GI specialist next month.    Problems:  Patient Active Problem List   Diagnosis Date Noted   Other allergic rhinitis 03/04/2022   Allergic conjunctivitis of both eyes 03/04/2022   Dyspnea on exertion 03/04/2022   Oral allergy  syndrome, subsequent encounter 03/04/2022   Rash and other nonspecific skin eruption 03/04/2022   Other adverse food reactions, not elsewhere classified, subsequent encounter 03/04/2022   Bowel obstruction (HCC) 12/26/2020   Constipation 08/23/2020   Nausea and vomiting 08/23/2020   Abdominal pain 08/07/2020   Insomnia 08/07/2020   Microcytic anemia 08/07/2020  Crohn's disease of small intestine with complication (HCC)     Allergies:  Allergies  Allergen Reactions   Banana Itching, Swelling and Other (See Comments)    Mouth itches and swells   Other Itching, Swelling and Other (See Comments)    (Fruit) MELON MIX- Mouth itches and swells   Latex Rash   Medications:  Current Outpatient Medications:    clindamycin (CLEOCIN) 300 MG capsule, Take 300 mg by mouth 3  (three) times daily., Disp: , Rfl:    dicyclomine  (BENTYL ) 20 MG tablet, Take 1 tablet (20 mg total) by mouth 2 (two) times daily., Disp: 20 tablet, Rfl: 0   docusate sodium  (COLACE) 100 MG capsule, Take 1 capsule (100 mg total) by mouth every 12 (twelve) hours., Disp: 60 capsule, Rfl: 0   famotidine  (PEPCID ) 20 MG tablet, Take 1 tablet (20 mg total) by mouth daily., Disp: 30 tablet, Rfl: 0   hydrochlorothiazide  (HYDRODIURIL ) 25 MG tablet, Take 25 mg by mouth daily., Disp: , Rfl:    hydrocortisone (ANUSOL-HC) 2.5 % rectal cream, Place 1 Application rectally See admin instructions. *2-4 times daily as needed*, Disp: , Rfl:    lidocaine  (XYLOCAINE ) 5 % ointment, Apply 1 Application topically as needed., Disp: 35.44 g, Rfl: 0   mercaptopurine  (PURINETHOL ) 50 MG tablet, Take by mouth., Disp: , Rfl:    mesalamine  (CANASA ) 1000 MG suppository, Place 1 suppository (1,000 mg total) rectally at bedtime., Disp: 30 suppository, Rfl: 12   methocarbamol  (ROBAXIN ) 500 MG tablet, Take 1,000 mg by mouth 4 (four) times daily., Disp: , Rfl:    metoCLOPramide  (REGLAN ) 5 MG tablet, Take 1 tablet (5 mg total) by mouth 3 (three) times daily., Disp: 90 tablet, Rfl: 0   ondansetron  (ZOFRAN ) 4 MG tablet, Take 1 tablet (4 mg total) by mouth every 4 (four) hours as needed for nausea or vomiting., Disp: 12 tablet, Rfl: 0   pantoprazole  (PROTONIX ) 40 MG tablet, Take 1 tablet (40 mg total) by mouth daily., Disp: 30 tablet, Rfl: 0   predniSONE  (STERAPRED UNI-PAK 21 TAB) 10 MG (21) TBPK tablet, 6 day taper; take as directed on package instructions, Disp: 21 tablet, Rfl: 0   predniSONE  (STERAPRED UNI-PAK 48 TAB) 10 MG (48) TBPK tablet, Take by mouth as directed., Disp: , Rfl:    PROAIR  HFA 108 (90 Base) MCG/ACT inhaler, Inhale 2 puffs into the lungs every 4 (four) hours as needed for wheezing or shortness of breath (ex-induced)., Disp: , Rfl:    WEGOVY 1 MG/0.5ML SOAJ, Inject 1 mg into the skin once a week., Disp: , Rfl:     zolpidem  (AMBIEN ) 10 MG tablet, Take 10 mg by mouth daily., Disp: , Rfl:   Observations/Objective: Patient is well-developed, well-nourished in no acute distress.  Resting comfortably at home.  Head is normocephalic, atraumatic.  No labored breathing.  Speech is clear and coherent with logical content.  Patient is alert and oriented at baseline.    Assessment and Plan: 1. Crohn's disease of both small and large intestine without complication (HCC) (Primary) - metoCLOPramide  (REGLAN ) 5 MG tablet; Take 1 tablet (5 mg total) by mouth 3 (three) times daily.  Dispense: 90 tablet; Refill: 0  - Medication refilled x 30 days - Keep scheduled appt with GI for follow up  Follow Up Instructions: I discussed the assessment and treatment plan with the patient. The patient was provided an opportunity to ask questions and all were answered. The patient agreed with the plan and demonstrated an understanding of the  instructions.  A copy of instructions were sent to the patient via MyChart unless otherwise noted below.    The patient was advised to call back or seek an in-person evaluation if the symptoms worsen or if the condition fails to improve as anticipated.    [name], PA-C

## 2023-10-09 NOTE — Patient Instructions (Signed)
 Morgan Chung, thank you for joining [name], PA-C for today's virtual visit.  While this provider is not your primary care provider (PCP), if your PCP is located in our provider database this encounter information will be shared with them immediately following your visit.   A [facility] MyChart account gives you access to today's visit and all your visits, tests, and labs performed at [facility] " click here if you don't have a [facility] MyChart account or go to [email or web address]  Consent: (Patient) Morgan Chung provided verbal consent for this virtual visit at the beginning of the encounter.  Current Medications:  Current Outpatient Medications:    clindamycin (CLEOCIN) 300 MG capsule, Take 300 mg by mouth 3 (three) times daily., Disp: , Rfl:    dicyclomine  (BENTYL ) 20 MG tablet, Take 1 tablet (20 mg total) by mouth 2 (two) times daily., Disp: 20 tablet, Rfl: 0   docusate sodium  (COLACE) 100 MG capsule, Take 1 capsule (100 mg total) by mouth every 12 (twelve) hours., Disp: 60 capsule, Rfl: 0   famotidine  (PEPCID ) 20 MG tablet, Take 1 tablet (20 mg total) by mouth daily., Disp: 30 tablet, Rfl: 0   hydrochlorothiazide  (HYDRODIURIL ) 25 MG tablet, Take 25 mg by mouth daily., Disp: , Rfl:    hydrocortisone (ANUSOL-HC) 2.5 % rectal cream, Place 1 Application rectally See admin instructions. *2-4 times daily as needed*, Disp: , Rfl:    lidocaine  (XYLOCAINE ) 5 % ointment, Apply 1 Application topically as needed., Disp: 35.44 g, Rfl: 0   mercaptopurine  (PURINETHOL ) 50 MG tablet, Take by mouth., Disp: , Rfl:    mesalamine  (CANASA ) 1000 MG suppository, Place 1 suppository (1,000 mg total) rectally at bedtime., Disp: 30 suppository, Rfl: 12   methocarbamol  (ROBAXIN ) 500 MG tablet, Take 1,000 mg by mouth 4 (four) times daily., Disp: , Rfl:    metoCLOPramide  (REGLAN ) 5 MG tablet, Take 1 tablet (5 mg total) by mouth 3 (three) times daily., Disp: 90 tablet, Rfl: 0    ondansetron  (ZOFRAN ) 4 MG tablet, Take 1 tablet (4 mg total) by mouth every 4 (four) hours as needed for nausea or vomiting., Disp: 12 tablet, Rfl: 0   pantoprazole  (PROTONIX ) 40 MG tablet, Take 1 tablet (40 mg total) by mouth daily., Disp: 30 tablet, Rfl: 0   predniSONE  (STERAPRED UNI-PAK 21 TAB) 10 MG (21) TBPK tablet, 6 day taper; take as directed on package instructions, Disp: 21 tablet, Rfl: 0   predniSONE  (STERAPRED UNI-PAK 48 TAB) 10 MG (48) TBPK tablet, Take by mouth as directed., Disp: , Rfl:    PROAIR  HFA 108 (90 Base) MCG/ACT inhaler, Inhale 2 puffs into the lungs every 4 (four) hours as needed for wheezing or shortness of breath (ex-induced)., Disp: , Rfl:    WEGOVY 1 MG/0.5ML SOAJ, Inject 1 mg into the skin once a week., Disp: , Rfl:    zolpidem  (AMBIEN ) 10 MG tablet, Take 10 mg by mouth daily., Disp: , Rfl:    Medications ordered in this encounter:  Meds ordered this encounter  Medications   metoCLOPramide  (REGLAN ) 5 MG tablet    Sig: Take 1 tablet (5 mg total) by mouth 3 (three) times daily.    Dispense:  90 tablet    Refill:  0    Supervising Provider:   Corine Dice [[id number]]     *If you need refills on other medications prior to your next appointment, please contact your pharmacy*  Follow-Up: Call back or seek an in-person evaluation if the symptoms  worsen or if the condition fails to improve as anticipated.  [facility] [phone]  Other Instructions Crohn's Disease  Crohn's disease is a long-lasting (chronic) disease that affects the gastrointestinal (GI) tract. Crohn's disease often causes irritation and inflammation in the small intestine and the beginning of the large intestine, but it can affect any part of the GI tract. Crohn's disease is part of a group of illnesses that are known as inflammatory bowel disease (IBD). Crohn's disease may start slowly and get worse over time. Symptoms may come and go. They may also go away for months or even  years at a time (remission). What are the causes? The exact cause of this condition is not known. It may involve a response that causes your body's disease-fighting system (immune system) to attack healthy cells and tissues (autoimmune response). Bacteria, genes, and your environment may also play a role. What increases the risk? The following factors may make you more likely to develop this condition: Having a family member who has Crohn's disease, another IBD, or an autoimmune condition. Using products that contain nicotine or tobacco, such as cigarettes and e-cigarettes. Being in your 60s. Having [place] European ancestry. What are the signs or symptoms? The main symptoms of this condition involve your GI tract. These include: Diarrhea. Pain or cramping in the abdomen commonly felt in the lower right side of the abdomen. Frequent watery or bloody stools. Constipation. This may mean having: Fewer bowel movements in a week than normal. Difficulty having a bowel movement. Stools that are dry, hard, or larger than normal. Rectal bleeding or pain. An urgent need to have a bowel movement. The feeling that you are not finished having a bowel movement. Other symptoms may include: Unexplained weight loss. Tiredness (fatigue). Fever. Nausea or appetite loss. Joint pain. Vision changes. Red bumps or sores on the skin. Sores inside the mouth. How is this diagnosed? This condition may be diagnosed based on: Your symptoms and medical history. A physical exam. Tests, which may include: Blood tests. Stool sample tests. Imaging tests, such as X-rays and CT scans. Tests to examine the inside of your intestines using a long, flexible tube that has a light and a camera on the end (colonoscopy). A procedure to remove tissue samples from inside your bowel for testing (biopsy). You may need to work with a health care provider who specializes in diseases of the digestive tract  (gastroenterologist). How is this treated? There is no cure for this condition, and it affects each person differently. Treatment can help you manage your symptoms. Your treatment may include: Medicines. These may be used by themselves or with other treatments (combination therapy). You may be given medicines that help to: Reduce inflammation. Control your immune system activity. Fight infections. Relieve cramps and prevent diarrhea. Control your pain. Surgery. You may need surgery if: Medicines and other treatments are not working anymore. You develop complications from severe Crohn's disease. A section of your intestine becomes so damaged that it needs to be removed. Lifestyle changes: Maintaining eating or drinking restrictions. Reducing or eliminating use of alcohol or nicotine. Follow these instructions at home: Medicines Take over-the-counter and prescription medicines only as told by your health care provider. If you were prescribed an antibiotic, take it as told by your health care provider. Do not stop taking the antibiotic even if you start to feel better. Avoid taking ibuprofen or other NSAID medicines if possible. These can make Crohn's disease worse. Eating and drinking Talk with  your health care provider or a registered dietitian about what diet is best for you. Drink enough fluid to keep your urine pale yellow. If you are taking steroids to reduce inflammation, get plenty of calcium in your diet to help keep your bones healthy. You may also consider taking a calcium supplement with vitamin D. Keep a food diary to identify foods that make your symptoms better or worse, and avoid foods that cause symptoms. Follow instructions from your health care provider about eating or drinking restrictions if you have worsening symptoms (flare-up). If you drink alcohol: Limit how much you have to: 0-1 drink a day for women who are not pregnant. 0-2 drinks a day for men. Know how much  alcohol is in a drink. In the [place] one drink equals one 12 oz bottle of beer (355 mL), one 5 oz glass of wine (148 mL), or one 1 oz glass of hard liquor (44 mL). General instructions Make sure you get all the vaccines that your health care provider recommends, especially pneumonia (pneumococcal) and flu (influenza) vaccines. Do not use any products that contain nicotine or tobacco. These products include cigarettes, chewing tobacco, and vaping devices, such as e-cigarettes. If you need help quitting, ask your health care provider. Exercise every day, or as often as told by your health care provider. Keep all follow-up visits. This is important. Contact a health care provider if: You have diarrhea, cramps in your abdomen, and other GI problems that are present almost all the time. Your symptoms do not improve with treatment, your symptoms get worse, or you develop new symptoms. You cannot pass stools. You continue to lose weight. You develop a rash or sores on your skin. You develop eye problems. You have a fever. Get help right away if: You have bloody diarrhea. You have severe pain in your abdomen. These symptoms may be an emergency. Get help right away. Call 911. Do not wait to see if the symptoms will go away. Do not drive yourself to the hospital. Summary Crohn's disease affects each person differently. The cause of this condition is not known, but it may involve a response that causes your body's immune system to attack healthy cells and tissues. There are multiple treatment options that can help you manage the condition. Talk with your health care provider or a registered dietitian about what diet is best for you. Make sure you get all the vaccines that your health care provider recommends, especially pneumonia (pneumococcal) and flu (influenza) vaccines. Get help right away if you have bloody diarrhea or severe pain in your abdomen. This information is not intended to replace advice  given to you by your health care provider. Make sure you discuss any questions you have with your health care provider. Document Revised: 11/07/2020 Document Reviewed: 11/07/2020 Elsevier Patient Education  2024 Elsevier Inc.   If you have been instructed to have an in-person evaluation today at a local Urgent Care facility, please use the link below. It will take you to a list of all of our available [facility], including address, phone number and hours of operation. Please do not delay care.  [facility]  If you or a family member do not have a primary care provider, use the link below to schedule a visit and establish care. When you choose a [facility] primary care physician or advanced practice provider, you gain a long-term partner in health. Find a Primary Care Provider  Learn more about [facility]'s in-office and virtual care options: [place] - Get Care Now

## 2023-10-29 ENCOUNTER — Telehealth: Admitting: Physician Assistant

## 2023-10-29 DIAGNOSIS — J069 Acute upper respiratory infection, unspecified: Secondary | ICD-10-CM

## 2023-10-29 MED ORDER — FLUTICASONE PROPIONATE 50 MCG/ACT NA SUSP: 2.0000 | Freq: Every day | NASAL | 0 refills | Status: DC

## 2023-10-29 MED ORDER — BENZONATATE 100 MG PO CAPS: 100.0000 mg | ORAL_CAPSULE | Freq: Three times a day (TID) | ORAL | 0 refills | Status: DC | PRN

## 2023-10-29 NOTE — Progress Notes (Signed)

## 2023-11-07 ENCOUNTER — Telehealth: Admitting: Family Medicine

## 2023-11-07 DIAGNOSIS — R112 Nausea with vomiting, unspecified: Secondary | ICD-10-CM | POA: Diagnosis not present

## 2023-11-07 DIAGNOSIS — K50119 Crohn's disease of large intestine with unspecified complications: Secondary | ICD-10-CM

## 2023-11-07 MED ORDER — ONDANSETRON 8 MG PO TBDP: 8.0000 mg | ORAL_TABLET | Freq: Three times a day (TID) | ORAL | 0 refills | Status: AC | PRN

## 2023-11-07 NOTE — Progress Notes (Signed)
 E-Visit for Nausea and Vomiting   We are sorry that you are not feeling well. Here is how we plan to help!  Based on what you have shared with me it looks like you have a Virus that is irritating your GI tract.  Vomiting is the forceful emptying of a portion of the stomach's content through the mouth.  Although nausea and vomiting can make you feel miserable, it's important to remember that these are not diseases, but rather symptoms of an underlying illness.  When we treat short term symptoms, we always caution that any symptoms that persist should be fully evaluated in a medical office.  I have prescribed a medication that will help alleviate your symptoms and allow you to stay hydrated:  I have sent zofran  8 mg ODT.   HOME CARE: Drink clear liquids.  This is very important! Dehydration (the lack of fluid) can lead to a serious complication.  Start off with 1 tablespoon every 5 minutes for 8 hours. You may begin eating bland foods after 8 hours without vomiting.  Start with saltine crackers, white bread, rice, mashed potatoes, applesauce. After 48 hours on a bland diet, you may resume a normal diet. Try to go to sleep.  Sleep often empties the stomach and relieves the need to vomit.  GET HELP RIGHT AWAY IF:  Your symptoms do not improve or worsen within 2 days after treatment. You have a fever for over 3 days. You cannot keep down fluids after trying the medication.  MAKE SURE YOU:  Understand these instructions. Will watch your condition. Will get help right away if you are not doing well or get worse.    Thank you for choosing an e-visit.  Your e-visit answers were reviewed by a board certified advanced clinical practitioner to complete your personal care plan. Depending upon the condition, your plan could have included both over the counter or prescription medications.  Please review your pharmacy choice. Make sure the pharmacy is open so you can pick up prescription now. If  there is a problem, you may contact your provider through [employer] and have the prescription routed to another pharmacy.  Your safety is important to us . If you have drug allergies check your prescription carefully.   For the next 24 hours you can use MyChart to ask questions about today's visit, request a non-urgent call back, or ask for a work or school excuse. You will get an email in the next two days asking about your experience. I hope that your e-visit has been valuable and will speed your recovery.    have provided 5 minutes of non face to face time during this encounter for chart review and documentation.

## 2023-12-13 ENCOUNTER — Telehealth: Admitting: Family Medicine

## 2023-12-13 DIAGNOSIS — K5080 Crohn's disease of both small and large intestine without complications: Secondary | ICD-10-CM | POA: Diagnosis not present

## 2023-12-13 DIAGNOSIS — R112 Nausea with vomiting, unspecified: Secondary | ICD-10-CM

## 2023-12-13 MED ORDER — ONDANSETRON 8 MG PO TBDP: 8.0000 mg | ORAL_TABLET | Freq: Three times a day (TID) | ORAL | 0 refills | Status: DC | PRN

## 2023-12-13 NOTE — Progress Notes (Signed)
 E-Visit for Nausea and Vomiting   We are sorry that you are not feeling well. Here is how we plan to help!  Based on what you have shared with me it looks like you have a Virus that is irritating your GI tract.  Vomiting is the forceful emptying of a portion of the stomach's content through the mouth.  Although nausea and vomiting can make you feel miserable, it's important to remember that these are not diseases, but rather symptoms of an underlying illness.  When we treat short term symptoms, we always caution that any symptoms that persist should be fully evaluated in a medical office.  I have prescribed a medication that will help alleviate your symptoms and allow you to stay hydrated:  I have sent Ondansetron  8 mg tabs to use prn. I see however we have filled this monthly. Please follow up with your gastroenterologist for further refills.   HOME CARE: Drink clear liquids.  This is very important! Dehydration (the lack of fluid) can lead to a serious complication.  Start off with 1 tablespoon every 5 minutes for 8 hours. You may begin eating bland foods after 8 hours without vomiting.  Start with saltine crackers, white bread, rice, mashed potatoes, applesauce. After 48 hours on a bland diet, you may resume a normal diet. Try to go to sleep.  Sleep often empties the stomach and relieves the need to vomit.  GET HELP RIGHT AWAY IF:  Your symptoms do not improve or worsen within 2 days after treatment. You have a fever for over 3 days. You cannot keep down fluids after trying the medication.  MAKE SURE YOU:  Understand these instructions. Will watch your condition. Will get help right away if you are not doing well or get worse.    Thank you for choosing an e-visit.  Your e-visit answers were reviewed by a board certified advanced clinical practitioner to complete your personal care plan. Depending upon the condition, your plan could have included both over the counter or prescription  medications.  Please review your pharmacy choice. Make sure the pharmacy is open so you can pick up prescription now. If there is a problem, you may contact your provider through [employer] and have the prescription routed to another pharmacy.  Your safety is important to us . If you have drug allergies check your prescription carefully.   For the next 24 hours you can use MyChart to ask questions about today's visit, request a non-urgent call back, or ask for a work or school excuse. You will get an email in the next two days asking about your experience. I hope that your e-visit has been valuable and will speed your recovery.   have provided 5 minutes of non face to face time during this encounter for chart review and documentation.

## 2024-01-12 ENCOUNTER — Telehealth: Admitting: Physician Assistant

## 2024-01-12 DIAGNOSIS — R197 Diarrhea, unspecified: Secondary | ICD-10-CM | POA: Diagnosis not present

## 2024-01-12 NOTE — Progress Notes (Signed)
 E-Visit for Diarrhea  We are sorry that you are not feeling well.  Here is how we plan to help!  Based on what you have shared with me it looks like you have Acute Infectious Diarrhea.  Most cases of acute diarrhea are due to infections with virus and bacteria and are self-limited conditions lasting less than 14 days.  For your symptoms you may take Imodium 2 mg tablets that are over the counter at your local pharmacy. Take two tablet now and then one after each loose stool up to 6 a day.  Antibiotics are not needed for most people with diarrhea.  Giving your history, if anything is not quickly resolving or you note any new/worsening symptoms especially fever or blood/mucous in the stool, you need an in-person evaluation ASAP.  I have sent a work note to Pharmacologist. You can find by going to the Menu on your homepage, scrolling down to the Communications section, and selecting Letters. Let us  know if you have any issue locating. Take care and feel better soon!   HOME CARE We recommend changing your diet to help with your symptoms for the next few days. Drink plenty of fluids that contain water salt and sugar. Sports drinks such as Gatorade may help.  You may try broths, soups, bananas, applesauce, soft breads, mashed potatoes or crackers.  You are considered infectious for as long as the diarrhea continues. Hand washing or use of alcohol based hand sanitizers is recommend. It is best to stay out of work or school until your symptoms stop.   GET HELP RIGHT AWAY If you have dark yellow colored urine or do not pass urine frequently you should drink more fluids.   If your symptoms worsen  If you feel like you are going to pass out (faint) You have a new problem  MAKE SURE YOU  Understand these instructions. Will watch your condition. Will get help right away if you are not doing well or get worse.  Thank you for choosing an e-visit.  Your e-visit answers were reviewed by a board  certified advanced clinical practitioner to complete your personal care plan. Depending upon the condition, your plan could have included both over the counter or prescription medications.  Please review your pharmacy choice. Make sure the pharmacy is open so you can pick up prescription now. If there is a problem, you may contact your provider through [employer] and have the prescription routed to another pharmacy.  Your safety is important to us . If you have drug allergies check your prescription carefully.   For the next 24 hours you can use MyChart to ask questions about today's visit, request a non-urgent call back, or ask for a work or school excuse. You will get an email in the next two days asking about your experience. I hope that your e-visit has been valuable and will speed your recovery.

## 2024-01-12 NOTE — Progress Notes (Signed)
 I have spent 5 minutes in review of e-visit questionnaire, review and updating patient chart, medical decision making and response to patient.   Elsie Velma Lunger, PA-C

## 2024-01-31 ENCOUNTER — Encounter

## 2024-01-31 ENCOUNTER — Telehealth: Admitting: Physician Assistant

## 2024-01-31 DIAGNOSIS — L219 Seborrheic dermatitis, unspecified: Secondary | ICD-10-CM | POA: Diagnosis not present

## 2024-01-31 MED ORDER — KETOCONAZOLE 2 % EX SHAM: 1.0000 | MEDICATED_SHAMPOO | CUTANEOUS | 0 refills | Status: DC

## 2024-01-31 NOTE — Progress Notes (Signed)
 E Visit for Rash  We are sorry that you are not feeling well. Here is how we plan to help!  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 I have prescribed Ketoconazole  shampoo. Use twice weekly in place of normal shampoo. Once completed you can switch to Selsun Blue which is available over the counter.   HOME CARE:  Take cool showers and avoid direct sunlight. Apply cool compress or wet dressings. Take a bath in an oatmeal bath.  Sprinkle content of one Aveeno packet under running faucet with comfortably warm water.  Bathe for 15-20 minutes, 1-2 times daily.  Pat dry with a towel. Do not rub the rash. Use hydrocortisone cream. Take an antihistamine like Benadryl for widespread rashes that itch.  The adult dose of Benadryl is 25-50 mg by mouth 4 times daily. Caution:  This type of medication may cause sleepiness.  Do not drink alcohol, drive, or operate dangerous machinery while taking antihistamines.  Do not take these medications if you have prostate enlargement.  Read package instructions thoroughly on all medications that you take.  GET HELP RIGHT AWAY IF:  Symptoms don't go away after treatment. Severe itching that persists. If you rash spreads or swells. If you rash begins to smell. If it blisters and opens or develops a yellow-brown crust. You develop a fever. You have a sore throat. You become short of breath.  MAKE SURE YOU:  Understand these instructions. Will watch your condition. Will get help right away if you are not doing well or get worse.  Thank you for choosing an e-visit.  Your e-visit answers were reviewed by a board certified advanced clinical practitioner to complete your personal care plan. Depending upon the condition, your plan could have included both over the counter or prescription medications.  Please review your pharmacy choice. Make sure the pharmacy is open so you can pick up prescription now. If  there is a problem, you may contact your provider through [employer] and have the prescription routed to another pharmacy.  Your safety is important to us . If you have drug allergies check your prescription carefully.   For the next 24 hours you can use MyChart to ask questions about today's visit, request a non-urgent call back, or ask for a work or school excuse. You will get an email in the next two days asking about your experience. I hope that your e-visit has been valuable and will speed your recovery.     I have spent 5 minutes in review of e-visit questionnaire, review and updating patient chart, medical decision making and response to patient.   Delon CHRISTELLA Dickinson, PA-C

## 2024-02-06 ENCOUNTER — Emergency Department (HOSPITAL_BASED_OUTPATIENT_CLINIC_OR_DEPARTMENT_OTHER)

## 2024-02-06 ENCOUNTER — Encounter (HOSPITAL_BASED_OUTPATIENT_CLINIC_OR_DEPARTMENT_OTHER): Payer: Self-pay | Admitting: Emergency Medicine

## 2024-02-06 ENCOUNTER — Emergency Department (HOSPITAL_BASED_OUTPATIENT_CLINIC_OR_DEPARTMENT_OTHER)
Admission: EM | Admit: 2024-02-06 | Discharge: 2024-02-06 | Disposition: A | Discharge status: Home / Self Care | Attending: Emergency Medicine | Admitting: Emergency Medicine

## 2024-02-06 DIAGNOSIS — Z91040 Latex allergy status: Secondary | ICD-10-CM | POA: Diagnosis not present

## 2024-02-06 DIAGNOSIS — N83201 Unspecified ovarian cyst, right side: Secondary | ICD-10-CM | POA: Diagnosis not present

## 2024-02-06 DIAGNOSIS — K625 Hemorrhage of anus and rectum: Secondary | ICD-10-CM | POA: Diagnosis present

## 2024-02-06 DIAGNOSIS — I10 Essential (primary) hypertension: Secondary | ICD-10-CM | POA: Diagnosis not present

## 2024-02-06 DIAGNOSIS — K5900 Constipation, unspecified: Secondary | ICD-10-CM | POA: Diagnosis not present

## 2024-02-06 DIAGNOSIS — J45909 Unspecified asthma, uncomplicated: Secondary | ICD-10-CM | POA: Diagnosis not present

## 2024-02-06 LAB — COMPREHENSIVE METABOLIC PANEL WITH GFR
ALT: 20 U/L (ref 0–44)
AST: 18 U/L (ref 15–41)
Albumin: 4.5 g/dL (ref 3.5–5.0)
Alkaline Phosphatase: 65 U/L (ref 38–126)
Anion gap: 13 (ref 5–15)
BUN: 10 mg/dL (ref 6–20)
CO2: 23 mmol/L (ref 22–32)
Calcium: 10 mg/dL (ref 8.9–10.3)
Chloride: 102 mmol/L (ref 98–111)
Creatinine, Ser: 0.99 mg/dL (ref 0.44–1.00)
GFR, Estimated: >60 mL/min (ref >60)
Glucose, Bld: 93 mg/dL (ref 70–99)
Potassium: 3.9 mmol/L (ref 3.5–5.1)
Sodium: 137 mmol/L (ref 135–145)
Total Bilirubin: 0.3 mg/dL (ref 0.0–1.2)
Total Protein: 7.6 g/dL (ref 6.5–8.1)

## 2024-02-06 LAB — CBC WITH DIFFERENTIAL/PLATELET
Abs Immature Granulocytes: 0.02 K/uL (ref 0.00–0.07)
Basophils Absolute: 0 K/uL (ref 0.0–0.1)
Basophils Relative: 0 %
Eosinophils Absolute: 0 K/uL (ref 0.0–0.5)
Eosinophils Relative: 1 %
HCT: 36.6 % (ref 36.0–46.0)
Hemoglobin: 11.6 g/dL — ABNORMAL LOW (ref 12.0–15.0)
Immature Granulocytes: 0 %
Lymphocytes Relative: 26 %
Lymphs Abs: 1.2 K/uL (ref 0.7–4.0)
MCH: 24.9 pg — ABNORMAL LOW (ref 26.0–34.0)
MCHC: 31.7 g/dL (ref 30.0–36.0)
MCV: 78.5 fL — ABNORMAL LOW (ref 80.0–100.0)
Monocytes Absolute: 0.2 K/uL (ref 0.1–1.0)
Monocytes Relative: 5 %
Neutro Abs: 3.1 K/uL (ref 1.7–7.7)
Neutrophils Relative %: 68 %
Platelets: 227 K/uL (ref 150–400)
RBC: 4.66 MIL/uL (ref 3.87–5.11)
RDW: 16.1 % — ABNORMAL HIGH (ref 11.5–15.5)
WBC: 4.5 K/uL (ref 4.0–10.5)
nRBC: 0 % (ref 0.0–0.2)

## 2024-02-06 LAB — URINALYSIS, W/ REFLEX TO CULTURE (INFECTION SUSPECTED)
Bilirubin Urine: NEGATIVE
Glucose, UA: NEGATIVE mg/dL
Hgb urine dipstick: NEGATIVE
Ketones, ur: NEGATIVE mg/dL
Leukocytes,Ua: NEGATIVE
Nitrite: NEGATIVE
Protein, ur: NEGATIVE mg/dL
Specific Gravity, Urine: 1.005 (ref 1.005–1.030)
pH: 6.5 (ref 5.0–8.0)

## 2024-02-06 LAB — LIPASE, BLOOD: Lipase: 19 U/L (ref 11–51)

## 2024-02-06 LAB — OCCULT BLOOD X 1 CARD TO LAB, STOOL: Fecal Occult Bld: POSITIVE — AB

## 2024-02-06 LAB — TROPONIN T, HIGH SENSITIVITY
Troponin T High Sensitivity: <15 ng/L (ref 0–19)
Troponin T High Sensitivity: <15 ng/L (ref 0–19)

## 2024-02-06 LAB — HCG, SERUM, QUALITATIVE: Preg, Serum: NEGATIVE

## 2024-02-06 MED ORDER — IOHEXOL 300 MG/ML  SOLN
100.0000 mL | Freq: Once | INTRAMUSCULAR | Status: AC | PRN
  Administered 2024-02-06: 100 mL via INTRAVENOUS

## 2024-02-06 MED ORDER — ONDANSETRON HCL 4 MG/2ML IJ SOLN
4.0000 mg | Freq: Once | INTRAMUSCULAR | Status: AC
  Administered 2024-02-06: 4 mg via INTRAVENOUS
  Filled 2024-02-06: qty 2

## 2024-02-06 MED ORDER — SODIUM CHLORIDE 0.9 % IV BOLUS
1000.0000 mL | Freq: Once | INTRAVENOUS | Status: AC
  Administered 2024-02-06: 1000 mL via INTRAVENOUS

## 2024-02-06 NOTE — ED Triage Notes (Signed)
 Blood in toilet when having BM Some dizziness, lightheaded, fatigue Hx crohns hasn't been taking meds

## 2024-02-06 NOTE — ED Provider Notes (Addendum)
 [place] [facility] AT [facility] Provider Note   CSN: [id number] Arrival date & time: 02/06/24  1603     Patient presents with: Rectal Bleeding   Morgan Chung is a 38 y.o. female.   Patient is a 38 year old female with a history of Crohn's disease who presents with rectal bleeding.  She said she noticed rectal bleeding today.  She has had a couple episodes where she has had pure blood when she felt like she had to have a bowel movement.  She says she has been feeling fatigued and a bit lightheaded over the last week.  She denies any shortness of breath but she does not have much energy.  She has had a little bit of tightness in her chest.  She did have an episode of rectal bleeding about a month ago but lasted 2 days and this resolved.  She has a little bit of pain across the sides of her abdomen.  She has not been on her mercaptopurine  for about 2 months.  She said she was in the process of moving and initially [facility] was out of it and then she was not able to go back and get it.  She started back again about 2 days ago.  She was having some constipation earlier this month but now she says her bowel movements are regular.  She denies any fevers.  No urinary symptoms.  No nausea or vomiting.  No history of rectal bleeding in the past.  She has had a prior anal fissure.  Recently she has had a little bit of pain in that area but that was when she was having the constipation which seems to have resolved.       Prior to Admission medications   Medication Sig Start Date End Date Taking? Authorizing Provider  benzonatate  (TESSALON ) 100 MG capsule Take 1-2 capsules (100-200 mg total) by mouth 3 (three) times daily as needed. 10/29/23   Vivienne Delon HERO, PA-C  clindamycin (CLEOCIN) 300 MG capsule Take 300 mg by mouth 3 (three) times daily. 08/18/23   [provider]  dicyclomine  (BENTYL ) 20 MG tablet Take 1 tablet (20 mg total) by mouth 2 (two) times daily. 08/31/23    Jerrol Agent, MD  docusate sodium  (COLACE) 100 MG capsule Take 1 capsule (100 mg total) by mouth every 12 (twelve) hours. 08/31/23   Jerrol Agent, MD  famotidine  (PEPCID ) 20 MG tablet Take 1 tablet (20 mg total) by mouth daily. 08/31/23 09/30/23  Jerrol Agent, MD  fluticasone  (FLONASE ) 50 MCG/ACT nasal spray Place 2 sprays into both nostrils daily. 10/29/23   Vivienne Delon HERO, PA-C  hydrochlorothiazide  (HYDRODIURIL ) 25 MG tablet Take 25 mg by mouth daily. 11/02/22   [provider]  hydrocortisone (ANUSOL-HC) 2.5 % rectal cream Place 1 Application rectally See admin instructions. *2-4 times daily as needed* 08/18/23   [provider]  ketoconazole  (NIZORAL ) 2 % shampoo Apply 1 Application topically 2 (two) times a week. 01/31/24   Vivienne Delon HERO, PA-C  lidocaine  (XYLOCAINE ) 5 % ointment Apply 1 Application topically as needed. 08/28/23   Lavell Bari LABOR, FNP  mercaptopurine  (PURINETHOL ) 50 MG tablet Take by mouth. 01/10/23   [provider]  mesalamine  (CANASA ) 1000 MG suppository Place 1 suppository (1,000 mg total) rectally at bedtime. 08/28/23   Lavell Bari A, FNP  methocarbamol  (ROBAXIN ) 500 MG tablet Take 1,000 mg by mouth 4 (four) times daily. 08/18/23   [provider]  metoCLOPramide  (REGLAN ) 5 MG tablet Take  1 tablet (5 mg total) by mouth 3 (three) times daily. 10/09/23   Vivienne Delon HERO, PA-C  ondansetron  (ZOFRAN ) 4 MG tablet Take 1 tablet (4 mg total) by mouth every 4 (four) hours as needed for nausea or vomiting. 01/12/23   Elnor Jayson LABOR, DO  ondansetron  (ZOFRAN -ODT) 8 MG disintegrating tablet Take 1 tablet (8 mg total) by mouth every 8 (eight) hours as needed for nausea or vomiting. 11/07/23   Almeda Loa ORN, FNP  ondansetron  (ZOFRAN -ODT) 8 MG disintegrating tablet Take 1 tablet (8 mg total) by mouth every 8 (eight) hours as needed for nausea or vomiting. 12/13/23   Blair, Diane W, FNP  pantoprazole  (PROTONIX ) 40 MG tablet Take 1 tablet (40 mg  total) by mouth daily. 08/31/23 09/30/23  Jerrol Agent, MD  predniSONE  (STERAPRED UNI-PAK 21 TAB) 10 MG (21) TBPK tablet 6 day taper; take as directed on package instructions 07/22/23   Blair, Diane W, FNP  predniSONE  (STERAPRED UNI-PAK 48 TAB) 10 MG (48) TBPK tablet Take by mouth as directed. 08/18/23   [provider]  PROAIR  HFA 108 (90 Base) MCG/ACT inhaler Inhale 2 puffs into the lungs every 4 (four) hours as needed for wheezing or shortness of breath (ex-induced). 06/11/20   [provider]  WEGOVY 1 MG/0.5ML SOAJ Inject 1 mg into the skin once a week. 08/13/23   [provider]  zolpidem  (AMBIEN ) 10 MG tablet Take 10 mg by mouth daily. 02/17/22   [provider]    Allergies: Banana, Other, and Latex    Review of Systems  Constitutional:  Positive for fatigue. Negative for chills, diaphoresis and fever.  HENT:  Negative for congestion, rhinorrhea and sneezing.   Eyes: Negative.   Respiratory:  Negative for cough, chest tightness and shortness of breath.   Cardiovascular:  Negative for chest pain and leg swelling.  Gastrointestinal:  Positive for abdominal pain and blood in stool. Negative for diarrhea, nausea and vomiting.  Genitourinary:  Negative for difficulty urinating, flank pain and frequency.  Musculoskeletal:  Negative for arthralgias and back pain.  Skin:  Negative for rash.  Neurological:  Positive for light-headedness. Negative for speech difficulty, weakness, numbness and headaches.    Updated Vital Signs BP 115/88   Pulse 86   Temp 98.7 F (37.1 C) (Oral)   Resp 18   LMP 01/14/2024 (Exact Date)   SpO2 97%   Physical Exam Constitutional:      Appearance: She is well-developed.  HENT:     Head: Normocephalic and atraumatic.  Eyes:     Pupils: Pupils are equal, round, and reactive to light.  Cardiovascular:     Rate and Rhythm: Normal rate and regular rhythm.     Heart sounds: Normal heart sounds.  Pulmonary:     Effort:  Pulmonary effort is normal. No respiratory distress.     Breath sounds: Normal breath sounds. No wheezing or rales.  Chest:     Chest wall: No tenderness.  Abdominal:     General: Bowel sounds are normal.     Palpations: Abdomen is soft.     Tenderness: There is no abdominal tenderness. There is no guarding or rebound.  Genitourinary:    Comments: Small skin tag, no hemorrhoids noted, no fissure visualized, no gross blood noted, no melena Musculoskeletal:        General: Normal range of motion.     Cervical back: Normal range of motion and neck supple.  Lymphadenopathy:     Cervical: No cervical  adenopathy.  Skin:    General: Skin is warm and dry.     Findings: No rash.  Neurological:     Mental Status: She is alert and oriented to person, place, and time.     (all labs ordered are listed, but only abnormal results are displayed) Labs Reviewed  CBC WITH DIFFERENTIAL/PLATELET - Abnormal; Notable for the following components:      Result Value   Hemoglobin 11.6 (*)    MCV 78.5 (*)    MCH 24.9 (*)    RDW 16.1 (*)    All other components within normal limits  URINALYSIS, W/ REFLEX TO CULTURE (INFECTION SUSPECTED) - Abnormal; Notable for the following components:   Color, Urine COLORLESS (*)    Bacteria, UA FEW (*)    All other components within normal limits  OCCULT BLOOD X 1 CARD TO LAB, STOOL - Abnormal; Notable for the following components:   Fecal Occult Bld POSITIVE (*)    All other components within normal limits  COMPREHENSIVE METABOLIC PANEL WITH GFR  LIPASE, BLOOD  HCG, SERUM, QUALITATIVE  TROPONIN T, HIGH SENSITIVITY  TROPONIN T, HIGH SENSITIVITY    EKG: EKG Interpretation Date/Time:  Sunday February 06 2024 20:15:18 EDT Ventricular Rate:  85 PR Interval:  171 QRS Duration:  111 QT Interval:  402 QTC Calculation: 478 R Axis:   -2  Text Interpretation: Sinus rhythm Consider anterior infarct since last tracing no significant change Confirmed by Lenor Hollering [phone]) on 02/06/2024 8:16:31 PM  Radiology: CT ABDOMEN PELVIS W CONTRAST Result Date: 02/06/2024 CLINICAL DATA:  Acute abdominal pain. EXAM: CT ABDOMEN AND PELVIS WITH CONTRAST TECHNIQUE: Multidetector CT imaging of the abdomen and pelvis was performed using the standard protocol following bolus administration of intravenous contrast. RADIATION DOSE REDUCTION: This exam was performed according to the departmental dose-optimization program which includes automated exposure control, adjustment of the mA and/or kV according to patient size and/or use of iterative reconstruction technique. CONTRAST:  OMNIPAQUE  IOHEXOL  300 MG/ML  SOLN COMPARISON:  CT abdomen and pelvis 08/31/2023. FINDINGS: Lower chest: No acute abnormality. Hepatobiliary: Gallstones are present. There is no biliary ductal dilatation. No focal liver lesions are seen. Pancreas: Unremarkable. No pancreatic ductal dilatation or surrounding inflammatory changes. Spleen: Normal in size without focal abnormality. Adrenals/Urinary Tract: The kidneys and adrenal glands are within normal limits. There is mild diffuse bladder wall thickening versus normal under distension. Stomach/Bowel: Ileocolic anastomosis is again seen. There is no evidence for bowel obstruction, pneumatosis, inflammation or free air. There is a large amount of stool throughout the entire colon. Vascular/Lymphatic: No significant vascular findings are present. No enlarged abdominal or pelvic lymph nodes. Reproductive: Heterogeneity and thickening of the anterior uterus is likely related to fibroid change. There is a dominant follicle or small cyst in the right ovary measuring 2.8 cm. Left adnexa is within normal limits. Other: There is trace free fluid in the pelvis. There is no focal abdominal wall hernia. Musculoskeletal: No acute osseous abnormality. IMPRESSION: 1. Mild diffuse bladder wall thickening versus normal under distension. Correlate clinically for cystitis. 2.  Cholelithiasis. 3. Large amount of stool throughout the colon. 4. Trace free fluid in the pelvis. 5. Fibroid uterus. 6. Right ovarian simple-appearing cyst measuring 2.8 cm. No follow-up imaging recommended. Note: This recommendation does not apply to premenarchal patients and to those with increased risk (genetic, family history, elevated tumor markers or other high-risk factors) of ovarian cancer. Reference: JACR 2020 Feb; 17(2):248-254 Electronically Signed   By: Amy  Maple M.D.   On: 02/06/2024 17:55     Procedures   Medications Ordered in the ED  sodium chloride  0.9 % bolus 1,000 mL (0 mLs Intravenous Stopped 02/06/24 1841)  ondansetron  (ZOFRAN ) injection 4 mg (4 mg Intravenous Given 02/06/24 1713)  iohexol  (OMNIPAQUE ) 300 MG/ML solution 100 mL (100 mLs Intravenous Contrast Given 02/06/24 1737)                                    Medical Decision Making Amount and/or Complexity of Data Reviewed Labs: ordered. Radiology: ordered.  Risk Prescription drug management.   This patient presents to the ED for concern of rectal bleeding, this involves an extensive number of treatment options, and is a complaint that carries with it a high risk of complications and morbidity.  I considered the following differential and admission for this acute, potentially life threatening condition.  The differential diagnosis includes diverticular bleed, Crohn's disease, hemorrhoid, anal fissure, mass  MDM:    Patient is a 38 year old who presents with rectal bleeding.  She has also been feeling fatigued and lightheaded.  She has had some intermittent chest discomfort but denies any currently.  Her EKG does not show any ischemic changes.  She has had 2 negative troponins.  Labs show no significant anemia.  Her hemoglobin is just under the normal value but similar to prior values and actually a little bit better.  Her other labs are nonconcerning.  CT scan of her abdomen pelvis did not show any evidence of  acute Crohn's flare.  She did have an ovarian cyst that she can follow-up with her OB/GYN regarding.  She also had some gallstones which currently are asymptomatic.  She had some noted constipation.  Discussed with her starting MiraLAX.  I did not see any visible blood on rectal exam although it was Hemoccult positive.  No melena.  No visualized hemorrhoid.  She has not had any ongoing bleeding.  Feel that she can have close follow-up with her gastroenterologist who is with [facility].  Discussed this with her and she will call tomorrow to follow-up with her gastroenterologist.  Will follow-up with her OB/GYN regarding the ovarian cyst.  Strict return precautions were given.  (Labs, imaging, consults)  Labs: I Ordered, and personally interpreted labs.  The pertinent results include: No significant anemia, troponin is normal, otherwise nonconcerning, urine not consistent with infection  Imaging Studies ordered: I ordered imaging studies including CT abdomen pelvis I independently visualized and interpreted imaging. I agree with the radiologist interpretation  Additional history obtained from chart.  External records from outside source obtained and reviewed including prior notes  Cardiac Monitoring: The patient was maintained on a cardiac monitor.  If on the cardiac monitor, I personally viewed and interpreted the cardiac monitored which showed an underlying rhythm of: Sinus rhythm  Reevaluation: After the interventions noted above, I reevaluated the patient and found that they have :improved  Social Determinants of Health:    Disposition: Discharged to home  Co morbidities that complicate the patient evaluation  Past Medical History:  Diagnosis Date   Anemia    Asthma    Crohn's disease (HCC)    Gastritis    GERD (gastroesophageal reflux disease)    Hypertension    Insomnia      Medicines Meds ordered this encounter  Medications   sodium chloride  0.9 % bolus 1,000 mL   ondansetron   (ZOFRAN ) injection 4  mg   iohexol  (OMNIPAQUE ) 300 MG/ML solution 100 mL    I have reviewed the patients home medicines and have made adjustments as needed  Problem List / ED Course: Problem List Items Addressed This Visit       Other   Constipation   Other Visit Diagnoses       Rectal bleeding    -  Primary     Right ovarian cyst                    Final diagnoses:  Rectal bleeding  Right ovarian cyst  Constipation, unspecified constipation type    ED Discharge Orders     None          Lenor Hollering, MD 02/06/24 2016    Lenor Hollering, MD 02/06/24 2022

## 2024-02-06 NOTE — ED Notes (Signed)
 Patient transported to CT

## 2024-02-06 NOTE — Discharge Instructions (Addendum)
 Call tomorrow to have close follow-up with your gastroenterologist.  Return to emergency room if you have any ongoing bleeding, worsening fatigue, shortness of breath or other worsening symptoms.  You can follow-up with your OB/GYN regarding your ovarian cyst.  Start taking the MiraLAX for your constipation.

## 2024-02-14 ENCOUNTER — Emergency Department (HOSPITAL_BASED_OUTPATIENT_CLINIC_OR_DEPARTMENT_OTHER)
Admission: EM | Admit: 2024-02-14 | Discharge: 2024-02-14 | Disposition: A | Discharge status: Home / Self Care | Attending: Emergency Medicine | Admitting: Emergency Medicine

## 2024-02-14 ENCOUNTER — Other Ambulatory Visit: Payer: Self-pay

## 2024-02-14 DIAGNOSIS — D72819 Decreased white blood cell count, unspecified: Secondary | ICD-10-CM | POA: Diagnosis not present

## 2024-02-14 DIAGNOSIS — I10 Essential (primary) hypertension: Secondary | ICD-10-CM | POA: Diagnosis not present

## 2024-02-14 DIAGNOSIS — G43909 Migraine, unspecified, not intractable, without status migrainosus: Secondary | ICD-10-CM | POA: Insufficient documentation

## 2024-02-14 DIAGNOSIS — D649 Anemia, unspecified: Secondary | ICD-10-CM | POA: Diagnosis not present

## 2024-02-14 DIAGNOSIS — Z79899 Other long term (current) drug therapy: Secondary | ICD-10-CM | POA: Insufficient documentation

## 2024-02-14 DIAGNOSIS — R519 Headache, unspecified: Secondary | ICD-10-CM | POA: Diagnosis present

## 2024-02-14 DIAGNOSIS — Z91040 Latex allergy status: Secondary | ICD-10-CM | POA: Diagnosis not present

## 2024-02-14 LAB — PREGNANCY, URINE: Preg Test, Ur: NEGATIVE

## 2024-02-14 LAB — CBC
HCT: 35.4 % — ABNORMAL LOW (ref 36.0–46.0)
Hemoglobin: 10.8 g/dL — ABNORMAL LOW (ref 12.0–15.0)
MCH: 24.5 pg — ABNORMAL LOW (ref 26.0–34.0)
MCHC: 30.5 g/dL (ref 30.0–36.0)
MCV: 80.3 fL (ref 80.0–100.0)
Platelets: 240 K/uL (ref 150–400)
RBC: 4.41 MIL/uL (ref 3.87–5.11)
RDW: 15.8 % — ABNORMAL HIGH (ref 11.5–15.5)
WBC: 3.9 K/uL — ABNORMAL LOW (ref 4.0–10.5)
nRBC: 0 % (ref 0.0–0.2)

## 2024-02-14 LAB — BASIC METABOLIC PANEL WITH GFR
Anion gap: 13 (ref 5–15)
BUN: <5 mg/dL — ABNORMAL LOW (ref 6–20)
CO2: 25 mmol/L (ref 22–32)
Calcium: 10.1 mg/dL (ref 8.9–10.3)
Chloride: 101 mmol/L (ref 98–111)
Creatinine, Ser: 0.93 mg/dL (ref 0.44–1.00)
GFR, Estimated: >60 mL/min (ref >60)
Glucose, Bld: 91 mg/dL (ref 70–99)
Potassium: 3.6 mmol/L (ref 3.5–5.1)
Sodium: 139 mmol/L (ref 135–145)

## 2024-02-14 LAB — RESP PANEL BY RT-PCR (RSV, FLU A&B, COVID)  RVPGX2
Influenza A by PCR: NEGATIVE
Influenza B by PCR: NEGATIVE
Resp Syncytial Virus by PCR: NEGATIVE
SARS Coronavirus 2 by RT PCR: NEGATIVE

## 2024-02-14 LAB — CBG MONITORING, ED: Glucose-Capillary: 84 mg/dL (ref 70–99)

## 2024-02-14 MED ORDER — ACETAMINOPHEN 500 MG PO TABS
1000.0000 mg | ORAL_TABLET | Freq: Once | ORAL | Status: AC
Start: 2024-02-14 — End: 2024-02-14
  Administered 2024-02-14: 1000 mg via ORAL
  Filled 2024-02-14: qty 2

## 2024-02-14 MED ORDER — METOCLOPRAMIDE HCL 5 MG/ML IJ SOLN
10.0000 mg | Freq: Once | INTRAMUSCULAR | Status: AC
  Administered 2024-02-14: 10 mg via INTRAVENOUS
  Filled 2024-02-14: qty 2

## 2024-02-14 MED ORDER — FLUORESCEIN SODIUM 1 MG OP STRP
1.0000 | ORAL_STRIP | Freq: Once | OPHTHALMIC | Status: AC
  Administered 2024-02-14: 1 via OPHTHALMIC
  Filled 2024-02-14: qty 1

## 2024-02-14 MED ORDER — TETRACAINE HCL 0.5 % OP SOLN
1.0000 [drp] | Freq: Once | OPHTHALMIC | Status: AC
  Administered 2024-02-14: 1 [drp] via OPHTHALMIC
  Filled 2024-02-14: qty 4

## 2024-02-14 MED ORDER — DIPHENHYDRAMINE HCL 50 MG/ML IJ SOLN
12.5000 mg | Freq: Once | INTRAMUSCULAR | Status: AC
  Administered 2024-02-14: 12.5 mg via INTRAVENOUS
  Filled 2024-02-14: qty 1

## 2024-02-14 MED ORDER — SODIUM CHLORIDE 0.9 % IV SOLN: INTRAVENOUS | Status: DC

## 2024-02-14 MED ORDER — SODIUM CHLORIDE 0.9 % IV BOLUS
1000.0000 mL | Freq: Once | INTRAVENOUS | Status: AC
  Administered 2024-02-14: 1000 mL via INTRAVENOUS

## 2024-02-14 NOTE — ED Notes (Signed)
 Pt ambulates to the bathroom to void.

## 2024-02-14 NOTE — ED Triage Notes (Signed)
 Pt arrives with c/o dizziness, headache, nausea and blurred vision that started on Thursday. No hx of migraines

## 2024-02-14 NOTE — ED Provider Notes (Signed)
 [facility] Provider Note   CSN: [id number] Arrival date & time: 02/14/24  9241     Patient presents with: Blurred Vision, Headache, and Dizziness   Morgan Chung is a 38 y.o. female.    Headache Associated symptoms: eye pain and photophobia   Associated symptoms: no fever, no numbness and no weakness   Dizziness Associated symptoms: headaches   Associated symptoms: no weakness      38 year old female with medical history significant for Crohn's disease on mercaptopurine , GERD, HTN presenting to the emergency department with headache and blurry vision.  Patient states that she has had gradual onset headache that started on Thursday with initial pain behind her left eye.  She endorsed some blurry vision in the left eye, no double vision, no facial droop, no focal numbness or weakness.  No rash.  No fevers or chills.  She has had associated nausea, light sensitivity, sound sensitivity and subsequent development of generalized headache.  No sudden onset or maximal in onset headache, no neck stiffness or rigidity.  Her PCP was concerned for possible uveitis in the setting of her Crohn's disease.  She denies any active eye pain at this time, denies any foreign body sensation.  Now primarily endorsing a headache. She does state that she is having flashes in the lateral aspect of the left eye. No floaters.   Prior to Admission medications   Medication Sig Start Date End Date Taking? Authorizing Provider  benzonatate  (TESSALON ) 100 MG capsule Take 1-2 capsules (100-200 mg total) by mouth 3 (three) times daily as needed. 10/29/23   Vivienne Delon HERO, PA-C  clindamycin (CLEOCIN) 300 MG capsule Take 300 mg by mouth 3 (three) times daily. 08/18/23   [provider]  dicyclomine  (BENTYL ) 20 MG tablet Take 1 tablet (20 mg total) by mouth 2 (two) times daily. 08/31/23   Jerrol Agent, MD  docusate sodium  (COLACE) 100 MG capsule Take 1 capsule (100 mg  total) by mouth every 12 (twelve) hours. 08/31/23   Jerrol Agent, MD  famotidine  (PEPCID ) 20 MG tablet Take 1 tablet (20 mg total) by mouth daily. 08/31/23 09/30/23  Jerrol Agent, MD  fluticasone  (FLONASE ) 50 MCG/ACT nasal spray Place 2 sprays into both nostrils daily. 10/29/23   Vivienne Delon HERO, PA-C  hydrochlorothiazide  (HYDRODIURIL ) 25 MG tablet Take 25 mg by mouth daily. 11/02/22   [provider]  hydrocortisone (ANUSOL-HC) 2.5 % rectal cream Place 1 Application rectally See admin instructions. *2-4 times daily as needed* 08/18/23   [provider]  ketoconazole  (NIZORAL ) 2 % shampoo Apply 1 Application topically 2 (two) times a week. 01/31/24   Vivienne Delon HERO, PA-C  lidocaine  (XYLOCAINE ) 5 % ointment Apply 1 Application topically as needed. 08/28/23   Lavell Bari LABOR, FNP  mercaptopurine  (PURINETHOL ) 50 MG tablet Take by mouth. 01/10/23   [provider]  mesalamine  (CANASA ) 1000 MG suppository Place 1 suppository (1,000 mg total) rectally at bedtime. 08/28/23   Lavell Bari A, FNP  methocarbamol  (ROBAXIN ) 500 MG tablet Take 1,000 mg by mouth 4 (four) times daily. 08/18/23   [provider]  metoCLOPramide  (REGLAN ) 5 MG tablet Take 1 tablet (5 mg total) by mouth 3 (three) times daily. 10/09/23   Vivienne Delon HERO, PA-C  ondansetron  (ZOFRAN ) 4 MG tablet Take 1 tablet (4 mg total) by mouth every 4 (four) hours as needed for nausea or vomiting. 01/12/23   Elnor Jayson LABOR, DO  ondansetron  (ZOFRAN -ODT) 8 MG disintegrating tablet Take 1  tablet (8 mg total) by mouth every 8 (eight) hours as needed for nausea or vomiting. 11/07/23   Almeda Loa ORN, FNP  ondansetron  (ZOFRAN -ODT) 8 MG disintegrating tablet Take 1 tablet (8 mg total) by mouth every 8 (eight) hours as needed for nausea or vomiting. 12/13/23   Blair, Diane W, FNP  pantoprazole  (PROTONIX ) 40 MG tablet Take 1 tablet (40 mg total) by mouth daily. 08/31/23 09/30/23  Jerrol Agent, MD  predniSONE  (STERAPRED  UNI-PAK 21 TAB) 10 MG (21) TBPK tablet 6 day taper; take as directed on package instructions 07/22/23   Blair, Diane W, FNP  predniSONE  (STERAPRED UNI-PAK 48 TAB) 10 MG (48) TBPK tablet Take by mouth as directed. 08/18/23   [provider]  PROAIR  HFA 108 (90 Base) MCG/ACT inhaler Inhale 2 puffs into the lungs every 4 (four) hours as needed for wheezing or shortness of breath (ex-induced). 06/11/20   [provider]  WEGOVY 1 MG/0.5ML SOAJ Inject 1 mg into the skin once a week. 08/13/23   [provider]  zolpidem  (AMBIEN ) 10 MG tablet Take 10 mg by mouth daily. 02/17/22   [provider]    Allergies: Banana, Other, and Latex    Review of Systems  Constitutional:  Negative for fever.  Eyes:  Positive for photophobia, pain and visual disturbance. Negative for redness.  Neurological:  Positive for light-headedness and headaches. Negative for weakness and numbness.  All other systems reviewed and are negative.   Updated Vital Signs Temp 98.6 F (37 C)   LMP 02/09/2024 (Exact Date)   Physical Exam Vitals and nursing note reviewed.  Constitutional:      General: She is not in acute distress.    Appearance: She is well-developed.  HENT:     Head: Normocephalic and atraumatic.  Eyes:     General: Vision grossly intact. Gaze aligned appropriately. No visual field deficit.       Right eye: No foreign body.        Left eye: No foreign body.     Intraocular pressure: Right eye pressure is 21 mmHg. Left eye pressure is 21 mmHg. Measurements were taken using a handheld tonometer.    Extraocular Movements: Extraocular movements intact.     Conjunctiva/sclera: Conjunctivae normal.     Right eye: Right conjunctiva is not injected. No chemosis, exudate or hemorrhage.    Left eye: Left conjunctiva is not injected. No chemosis, exudate or hemorrhage.    Pupils: Pupils are equal, round, and reactive to light.     Right eye: No corneal abrasion or fluorescein uptake.  Seidel exam negative.     Left eye: No corneal abrasion or fluorescein uptake. Seidel exam negative.    Slit lamp exam:    Right eye: No corneal ulcer, foreign body or photophobia.     Left eye: No corneal ulcer, foreign body or photophobia.     Visual Fields: Right eye visual fields normal and left eye visual fields normal.  Cardiovascular:     Rate and Rhythm: Normal rate and regular rhythm.     Heart sounds: No murmur heard. Pulmonary:     Effort: Pulmonary effort is normal. No respiratory distress.     Breath sounds: Normal breath sounds.  Abdominal:     Palpations: Abdomen is soft.     Tenderness: There is no abdominal tenderness.  Musculoskeletal:        General: No swelling.     Cervical back: Neck supple.  Skin:    General:  Skin is warm and dry.     Capillary Refill: Capillary refill takes less than 2 seconds.  Neurological:     Mental Status: She is alert.     GCS: GCS eye subscore is 4. GCS verbal subscore is 5. GCS motor subscore is 6.     Cranial Nerves: No cranial nerve deficit, dysarthria or facial asymmetry.     Sensory: No sensory deficit.     Motor: No weakness.  Psychiatric:        Mood and Affect: Mood normal.     (all labs ordered are listed, but only abnormal results are displayed) Labs Reviewed  BASIC METABOLIC PANEL WITH GFR - Abnormal; Notable for the following components:      Result Value   BUN <5 (*)    All other components within normal limits  CBC - Abnormal; Notable for the following components:   WBC 3.9 (*)    Hemoglobin 10.8 (*)    HCT 35.4 (*)    MCH 24.5 (*)    RDW 15.8 (*)    All other components within normal limits  RESP PANEL BY RT-PCR (RSV, FLU A&B, COVID)  RVPGX2  PREGNANCY, URINE  CBG MONITORING, ED    EKG: None  Radiology: No results found.   Procedures   Medications Ordered in the ED  sodium chloride  0.9 % bolus 1,000 mL (1,000 mLs Intravenous New Bag/Given 02/14/24 0929)    And  0.9 %  sodium chloride  infusion (  Intravenous New Bag/Given 02/14/24 1025)  metoCLOPramide  (REGLAN ) injection 10 mg (10 mg Intravenous Given 02/14/24 0931)  diphenhydrAMINE (BENADRYL) injection 12.5 mg (12.5 mg Intravenous Given 02/14/24 0930)  acetaminophen  (TYLENOL ) tablet 1,000 mg (1,000 mg Oral Given 02/14/24 0836)  tetracaine (PONTOCAINE) 0.5 % ophthalmic solution 1-2 drop (1 drop Both Eyes Given 02/14/24 0932)  fluorescein ophthalmic strip 1 strip (1 strip Both Eyes Given 02/14/24 0932)                                    Medical Decision Making Amount and/or Complexity of Data Reviewed Labs: ordered.  Risk OTC drugs. Prescription drug management.    37 year old female with medical history significant for Crohn's disease on mercaptopurine , GERD, HTN presenting to the emergency department with headache and blurry vision.  Patient states that she has had gradual onset headache that started on Thursday with initial pain behind her left eye.  She endorsed some blurry vision in the left eye, no double vision, no facial droop, no focal numbness or weakness.  No rash.  No fevers or chills.  She has had associated nausea, light sensitivity, sound sensitivity and subsequent development of generalized headache.  No sudden onset or maximal in onset headache, no neck stiffness or rigidity.  Her PCP was concerned for possible uveitis in the setting of her Crohn's disease.  She denies any active eye pain at this time, denies any foreign body sensation.  Now primarily endorsing a headache. She does state that she is having flashes in the lateral aspect of the left eye. No floaters.   Morgan Chung is a 38 y.o. female who presents with headache and visual complaints as per above. I have reviewed the nursing documentation for past medical history, family history, and social history. I have reviewed the EMR and have learned that she has a history of Crohn's disease..  On arrival, the patient was afebrile. Currently she is awake,  alert, GCS 15,  HDS, and afebrile. Her exam is most notable for fully intact extraocular motions with bilaterally reactive pupils, no focal neurologic deficits, no meningismus, and no temporal tenderness. There is no rash. The headache was not sudden onset or the worst headache of the patient's life. There is no visual field deficit.  Slit-lamp exam revealed no evidence of corneal abrasion or ulceration.  Visual fields are intact, gaze is aligned appropriately.  I am most concerned for Migraine headache. Considered uveitis as well given patient pain.   To further evaluate and risk stratify her, labs were obtained, which were significant for:  Labs: CBC with a leukopenia to 3.9, hemoglobin mildly anemic to 10.8, urine pregnancy negative, BMP unremarkable, CBG 84  Patient was administered a migraine cocktail and was notably feeling symptomatically improved following this, photophobia had resolved, extraocular movement pain resolved.  Ophthalmology consult: Spoke with Dr. Grissom who recommended the patient follow-up in his clinic today or tomorrow.   I do not think the patient has an aneurysm, intracranial bleed, mass lesion, meningitis, temporal arteritis, stroke, cluster headache, idiopathic intracranial hypertension, cavernous sinus thrombosis, carbon monoxide toxicity, herpes zoster, carotid or vertebral artery dissection, or acute angle close glaucoma.  On reassessment, the patient remained well appearing and was again able to ambulate without difficulty and did not have any focal neurologic deficits.  I believe the patient is stable for DC.  We participated in shared decision making regarding continued observation in the ED versus discharge home for continued recovery after receiving the above medications. She preferred to recover at home. I believe that this is safe and reasonable.  We have discussed the diagnosis and risks, and we agree with discharging home to follow-up with their primary doctor. We also  discussed returning to the Emergency Department immediately if new or worsening symptoms occur. We have discussed the symptoms which are most concerning (e.g., changing or worsening pain, weakness, vomiting, fever, or abnormal sensation) that necessitate immediate return. I provided ED return precautions. The patient felt safe with this plan.  ED Medication Summary: Medications  sodium chloride  0.9 % bolus 1,000 mL (1,000 mLs Intravenous New Bag/Given 02/14/24 0929)    And  0.9 %  sodium chloride  infusion ( Intravenous New Bag/Given 02/14/24 1025)  metoCLOPramide  (REGLAN ) injection 10 mg (10 mg Intravenous Given 02/14/24 0931)  diphenhydrAMINE (BENADRYL) injection 12.5 mg (12.5 mg Intravenous Given 02/14/24 0930)  acetaminophen  (TYLENOL ) tablet 1,000 mg (1,000 mg Oral Given 02/14/24 0836)  tetracaine (PONTOCAINE) 0.5 % ophthalmic solution 1-2 drop (1 drop Both Eyes Given 02/14/24 0932)  fluorescein ophthalmic strip 1 strip (1 strip Both Eyes Given 02/14/24 0932)        Final diagnoses:  Migraine without status migrainosus, not intractable, unspecified migraine type    ED Discharge Orders     None          Jerrol Agent, MD 02/14/24 1125

## 2024-02-14 NOTE — ED Notes (Signed)
 Slit lamp and tetracaine and fluorescein at the bedside.

## 2024-02-14 NOTE — Discharge Instructions (Addendum)
 Your symptoms are most consistent with a migraine and resolved with a migraine cocktail in the ED.  Given your history of Crohn's disease and some concern for uveitis, recommend you follow-up with ophthalmology in clinic today for further assessment.

## 2024-02-14 NOTE — ED Notes (Signed)
 Slit lamp at the bedside.  Pt would like IV in her right forearm, triage RN attempted IV start without success, second RN to place IV

## 2024-02-16 ENCOUNTER — Telehealth: Admitting: Family Medicine

## 2024-02-16 DIAGNOSIS — B3731 Acute candidiasis of vulva and vagina: Secondary | ICD-10-CM | POA: Diagnosis not present

## 2024-02-16 MED ORDER — FLUCONAZOLE 150 MG PO TABS: 150.0000 mg | ORAL_TABLET | ORAL | 0 refills | Status: DC

## 2024-02-16 NOTE — Progress Notes (Signed)

## 2024-03-13 ENCOUNTER — Encounter: Payer: Self-pay | Admitting: Obstetrics

## 2024-03-13 ENCOUNTER — Other Ambulatory Visit (HOSPITAL_COMMUNITY)
Admission: RE | Admit: 2024-03-13 | Discharge: 2024-03-13 | Disposition: A | Discharge status: Home / Self Care | Source: Ambulatory Visit | Attending: Obstetrics | Admitting: Obstetrics

## 2024-03-13 ENCOUNTER — Ambulatory Visit (INDEPENDENT_AMBULATORY_CARE_PROVIDER_SITE_OTHER): Admitting: Obstetrics

## 2024-03-13 VITALS — BP 134/96 | HR 76 | Ht 65.0 in | Wt 190.6 lb

## 2024-03-13 DIAGNOSIS — N898 Other specified noninflammatory disorders of vagina: Secondary | ICD-10-CM

## 2024-03-13 DIAGNOSIS — Z01419 Encounter for gynecological examination (general) (routine) without abnormal findings: Secondary | ICD-10-CM | POA: Diagnosis present

## 2024-03-13 DIAGNOSIS — D508 Other iron deficiency anemias: Secondary | ICD-10-CM

## 2024-03-13 DIAGNOSIS — Z3009 Encounter for other general counseling and advice on contraception: Secondary | ICD-10-CM

## 2024-03-13 MED ORDER — ACCRUFER 30 MG PO CAPS: 1.0000 | ORAL_CAPSULE | Freq: Two times a day (BID) | ORAL | 3 refills | Status: AC

## 2024-03-13 NOTE — Progress Notes (Signed)
 Subjective:        Morgan Chung is a 38 y.o. female here for a routine exam.  Current complaints: None.    Personal health questionnaire:  Is patient [name], have a family history of breast and/or ovarian cancer: no Is there a family history of uterine cancer diagnosed at age < 66, gastrointestinal cancer, urinary tract cancer, family member who is a Personnel Officer syndrome-associated carrier: no Is the patient overweight and hypertensive, family history of diabetes, personal history of gestational diabetes, preeclampsia or PCOS: yes Is patient over 78, have PCOS,  family history of premature CHD under age 58, diabetes, smoke, have hypertension or peripheral artery disease:  no At any time, has a partner hit, kicked or otherwise hurt or frightened you?: no Over the past 2 weeks, have you felt down, depressed or hopeless?: no Over the past 2 weeks, have you felt little interest or pleasure in doing things?:no   Gynecologic History Patient's last menstrual period was 03/05/2024 (exact date). Contraception: none Last Pap: 2023. Results were: normal Last mammogram: 2020. Results were: normal  Obstetric History OB History  No obstetric history on file.    Past Medical History:  Diagnosis Date   Anemia    Asthma    Crohn's disease (HCC)    Gastritis    GERD (gastroesophageal reflux disease)    Hypertension    Insomnia     Past Surgical History:  Procedure Laterality Date   BOWEL RESECTION     CESAREAN SECTION       Current Outpatient Medications:    benzonatate  (TESSALON ) 100 MG capsule, Take 1-2 capsules (100-200 mg total) by mouth 3 (three) times daily as needed., Disp: 30 capsule, Rfl: 0   clindamycin (CLEOCIN) 300 MG capsule, Take 300 mg by mouth 3 (three) times daily., Disp: , Rfl:    dicyclomine  (BENTYL ) 20 MG tablet, Take 1 tablet (20 mg total) by mouth 2 (two) times daily., Disp: 20 tablet, Rfl: 0   docusate sodium  (COLACE) 100 MG capsule, Take 1 capsule (100 mg  total) by mouth every 12 (twelve) hours., Disp: 60 capsule, Rfl: 0   famotidine  (PEPCID ) 20 MG tablet, Take 1 tablet (20 mg total) by mouth daily., Disp: 30 tablet, Rfl: 0   Ferric Maltol (ACCRUFER) 30 MG CAPS, Take 1 capsule (30 mg total) by mouth 2 (two) times daily before a meal. Take 2 hrs before, or 2 hrs after a meal., Disp: 60 capsule, Rfl: 3   fluconazole  (DIFLUCAN ) 150 MG tablet, Take 1 tablet (150 mg total) by mouth as directed. Repeat in 3 days as needed, Disp: 2 tablet, Rfl: 0   fluticasone  (FLONASE ) 50 MCG/ACT nasal spray, Place 2 sprays into both nostrils daily., Disp: 16 g, Rfl: 0   hydrochlorothiazide  (HYDRODIURIL ) 25 MG tablet, Take 25 mg by mouth daily., Disp: , Rfl:    hydrocortisone (ANUSOL-HC) 2.5 % rectal cream, Place 1 Application rectally See admin instructions. *2-4 times daily as needed*, Disp: , Rfl:    ketoconazole  (NIZORAL ) 2 % shampoo, Apply 1 Application topically 2 (two) times a week., Disp: 240 mL, Rfl: 0   lidocaine  (XYLOCAINE ) 5 % ointment, Apply 1 Application topically as needed., Disp: 35.44 g, Rfl: 0   mercaptopurine  (PURINETHOL ) 50 MG tablet, Take by mouth., Disp: , Rfl:    mesalamine  (CANASA ) 1000 MG suppository, Place 1 suppository (1,000 mg total) rectally at bedtime., Disp: 30 suppository, Rfl: 12   methocarbamol  (ROBAXIN ) 500 MG tablet, Take 1,000 mg by mouth 4 (  four) times daily., Disp: , Rfl:    metoCLOPramide  (REGLAN ) 5 MG tablet, Take 1 tablet (5 mg total) by mouth 3 (three) times daily., Disp: 90 tablet, Rfl: 0   ondansetron  (ZOFRAN ) 4 MG tablet, Take 1 tablet (4 mg total) by mouth every 4 (four) hours as needed for nausea or vomiting., Disp: 12 tablet, Rfl: 0   ondansetron  (ZOFRAN -ODT) 8 MG disintegrating tablet, Take 1 tablet (8 mg total) by mouth every 8 (eight) hours as needed for nausea or vomiting., Disp: 20 tablet, Rfl: 0   ondansetron  (ZOFRAN -ODT) 8 MG disintegrating tablet, Take 1 tablet (8 mg total) by mouth every 8 (eight) hours as needed for  nausea or vomiting., Disp: 20 tablet, Rfl: 0   pantoprazole  (PROTONIX ) 40 MG tablet, Take 1 tablet (40 mg total) by mouth daily., Disp: 30 tablet, Rfl: 0   predniSONE  (STERAPRED UNI-PAK 21 TAB) 10 MG (21) TBPK tablet, 6 day taper; take as directed on package instructions, Disp: 21 tablet, Rfl: 0   PROAIR  HFA 108 (90 Base) MCG/ACT inhaler, Inhale 2 puffs into the lungs every 4 (four) hours as needed for wheezing or shortness of breath (ex-induced)., Disp: , Rfl:    WEGOVY 1 MG/0.5ML SOAJ, Inject 1 mg into the skin once a week., Disp: , Rfl:    zolpidem  (AMBIEN ) 10 MG tablet, Take 10 mg by mouth daily., Disp: , Rfl:    predniSONE  (STERAPRED UNI-PAK 48 TAB) 10 MG (48) TBPK tablet, Take by mouth as directed. (Patient not taking: Reported on 03/13/2024), Disp: , Rfl:  Allergies  Allergen Reactions   Banana Itching, Swelling and Other (See Comments)    Mouth itches and swells   Other Itching, Swelling and Other (See Comments)    (Fruit) MELON MIX- Mouth itches and swells   Latex Rash    Social History   Tobacco Use   Smoking status: Never   Smokeless tobacco: Never  Substance Use Topics   Alcohol use: Yes    Comment: occasionally    Family History  Problem Relation Age of Onset   Hypertension Mother    Irritable bowel syndrome Mother    Diabetes Father    Hypertension Father    Irritable bowel syndrome Sister    Colon cancer Neg Hx    Pancreatic cancer Neg Hx    Stomach cancer Neg Hx    Esophageal cancer Neg Hx    Liver cancer Neg Hx    Colon polyps Neg Hx       Review of Systems  Constitutional: negative for fatigue and weight loss Respiratory: negative for cough and wheezing Cardiovascular: negative for chest pain, fatigue and palpitations Gastrointestinal: negative for abdominal pain and change in bowel habits Musculoskeletal:negative for myalgias Neurological: negative for gait problems and tremors Behavioral/Psych: negative for abusive relationship,  depression Endocrine: negative for temperature intolerance    Genitourinary: positive for vaginal discharge.  negative for abnormal menstrual periods, genital lesions, hot flashes, sexual problems  Integument/breast: negative for breast lump, breast tenderness, nipple discharge and skin lesion(s)    Objective:       BP (!) 134/96   Pulse 76   Ht 5' 5 (1.651 m)   Wt 190 lb 9.6 oz (86.5 kg)   LMP 03/05/2024 (Exact Date)   BMI 31.72 kg/m  General:   Alert and no distress  Skin:   no rash or abnormalities  Lungs:   clear to auscultation bilaterally  Heart:   regular rate and rhythm, S1, S2 normal, no murmur,  click, rub or gallop  Breasts:   normal without suspicious masses, skin or nipple changes or axillary nodes  Abdomen:  normal findings: no organomegaly, soft, non-tender and no hernia  Pelvis:  External genitalia: normal general appearance Urinary system: urethral meatus normal and bladder without fullness, nontender Vaginal: normal without tenderness, induration or masses Cervix: normal appearance Adnexa: normal bimanual exam Uterus: anteverted and non-tender, normal size   Lab Review Urine pregnancy test Labs reviewed yes Radiologic studies reviewed yes   CT ABDOMEN PELVIS W CONTRAST (Accession [id number]) (Order [id number]) Imaging Date: 02/06/2024 Department: [facility] Released By/Authorizing: Lenor Hollering, MD (auto-released)   Exam Status  Status  Final [99]   PACS Intelerad Image Link   Show images for CT ABDOMEN PELVIS W CONTRAST Study Result  Narrative & Impression  CLINICAL DATA:  Acute abdominal pain.   EXAM: CT ABDOMEN AND PELVIS WITH CONTRAST   TECHNIQUE: Multidetector CT imaging of the abdomen and pelvis was performed using the standard protocol following bolus administration of intravenous contrast.   RADIATION DOSE REDUCTION: This exam was performed according to the departmental dose-optimization  program which includes automated exposure control, adjustment of the mA and/or kV according to patient size and/or use of iterative reconstruction technique.   CONTRAST:  OMNIPAQUE  IOHEXOL  300 MG/ML  SOLN   COMPARISON:  CT abdomen and pelvis 08/31/2023.   FINDINGS: Lower chest: No acute abnormality.   Hepatobiliary: Gallstones are present. There is no biliary ductal dilatation. No focal liver lesions are seen.   Pancreas: Unremarkable. No pancreatic ductal dilatation or surrounding inflammatory changes.   Spleen: Normal in size without focal abnormality.   Adrenals/Urinary Tract: The kidneys and adrenal glands are within normal limits. There is mild diffuse bladder wall thickening versus normal under distension.   Stomach/Bowel: Ileocolic anastomosis is again seen. There is no evidence for bowel obstruction, pneumatosis, inflammation or free air. There is a large amount of stool throughout the entire colon.   Vascular/Lymphatic: No significant vascular findings are present. No enlarged abdominal or pelvic lymph nodes.   Reproductive: Heterogeneity and thickening of the anterior uterus is likely related to fibroid change. There is a dominant follicle or small cyst in the right ovary measuring 2.8 cm. Left adnexa is within normal limits.   Other: There is trace free fluid in the pelvis. There is no focal abdominal wall hernia.   Musculoskeletal: No acute osseous abnormality.   IMPRESSION: 1. Mild diffuse bladder wall thickening versus normal under distension. Correlate clinically for cystitis. 2. Cholelithiasis. 3. Large amount of stool throughout the colon. 4. Trace free fluid in the pelvis. 5. Fibroid uterus. 6. Right ovarian simple-appearing cyst measuring 2.8 cm. No follow-up imaging recommended. Note: This recommendation does not apply to premenarchal patients and to those with increased risk (genetic, family history, elevated tumor markers or other  high-risk factors) of ovarian cancer. Reference: JACR 2020 Feb; 17(2):248-254     Electronically Signed   By: Greig Pique M.D.   On: 02/06/2024 17:55       Assessment:    .1. Encounter for gynecological examination with Papanicolaou smear of cervix (Primary) Rx: - Cytology - PAP( [place])  2. Vaginal discharge Rx: - Cervicovaginal ancillary only  3. Encounter for other general counseling and advice on contraception - declines contraception  4. Iron  deficiency anemia secondary to inadequate dietary iron  intake Rx: - Ferric Maltol (ACCRUFER) 30 MG CAPS; Take 1 capsule (30 mg total) by mouth 2 (two) times  daily before a meal. Take 2 hrs before, or 2 hrs after a meal.  Dispense: 60 capsule; Refill: 3     Plan:    Education reviewed: calcium supplements, depression evaluation, low fat, low cholesterol diet, safe sex/STD prevention, self breast exams, and weight bearing exercise. Follow up in: 1 year.   Meds ordered this encounter  Medications   Ferric Maltol (ACCRUFER) 30 MG CAPS    Sig: Take 1 capsule (30 mg total) by mouth 2 (two) times daily before a meal. Take 2 hrs before, or 2 hrs after a meal.    Dispense:  60 capsule    Refill:  3     Tyshauna Finkbiner A. Lula Michaux, MD, FACOG Attending Obstetrician & Gynecologist, [facility], [facility], [place] 03/13/2024

## 2024-03-13 NOTE — Progress Notes (Signed)
 Pt presents for fibroids and cyst.

## 2024-03-14 LAB — CERVICOVAGINAL ANCILLARY ONLY
Bacterial Vaginitis (gardnerella): NEGATIVE
Candida Glabrata: NEGATIVE
Candida Vaginitis: NEGATIVE
Chlamydia: NEGATIVE
Comment: NEGATIVE
Comment: NEGATIVE
Comment: NEGATIVE
Comment: NEGATIVE
Comment: NEGATIVE
Comment: NORMAL
Neisseria Gonorrhea: NEGATIVE
Trichomonas: NEGATIVE

## 2024-03-15 LAB — CYTOLOGY - PAP
Comment: NEGATIVE
Diagnosis: NEGATIVE
High risk HPV: NEGATIVE

## 2024-03-23 ENCOUNTER — Telehealth: Admitting: Physician Assistant

## 2024-03-23 DIAGNOSIS — L219 Seborrheic dermatitis, unspecified: Secondary | ICD-10-CM

## 2024-03-23 MED ORDER — ZORYVE 0.3 % EX FOAM: 1.0000 | Freq: Every day | CUTANEOUS | 0 refills | Status: AC

## 2024-03-23 NOTE — Progress Notes (Signed)
 E Visit for Rash  We are sorry that you are not feeling well. Here is how we plan to help!  I am sending the foam. Follow up with pcp or dermatolgy for refills.    HOME CARE:  Take cool showers and avoid direct sunlight. Apply cool compress or wet dressings. Take a bath in an oatmeal bath.  Sprinkle content of one Aveeno packet under running faucet with comfortably warm water.  Bathe for 15-20 minutes, 1-2 times daily.  Pat dry with a towel. Do not rub the rash. Use hydrocortisone cream. Take an antihistamine like Benadryl for widespread rashes that itch.  The adult dose of Benadryl is 25-50 mg by mouth 4 times daily. Caution:  This type of medication may cause sleepiness.  Do not drink alcohol, drive, or operate dangerous machinery while taking antihistamines.  Do not take these medications if you have prostate enlargement.  Read package instructions thoroughly on all medications that you take.  GET HELP RIGHT AWAY IF:  Symptoms don't go away after treatment. Severe itching that persists. If you rash spreads or swells. If you rash begins to smell. If it blisters and opens or develops a yellow-brown crust. You develop a fever. You have a sore throat. You become short of breath.  MAKE SURE YOU:  Understand these instructions. Will watch your condition. Will get help right away if you are not doing well or get worse.  Thank you for choosing an e-visit. Your e-visit answers were reviewed by a board certified advanced clinical practitioner to complete your personal care plan. Depending upon the condition, your plan could have included both over the counter or prescription medications. Please review your pharmacy choice. Be sure that the pharmacy you have chosen is open so that you can pick up your prescription now.  If there is a problem you may message your provider in MyChart to have the prescription routed to another pharmacy. Your safety is important to us . If you have drug  allergies check your prescription carefully.  For the next 24 hours, you can use MyChart to ask questions about today's visit, request a non-urgent call back, or ask for a work or school excuse from your e-visit provider. You will get an email in the next two days asking about your experience. I hope that your e-visit has been valuable and will speed your recovery.  I have spent 5 minutes in review of e-visit questionnaire, review and updating patient chart, medical decision making and response to patient.   Doreene Forrey, FNP

## 2024-03-31 ENCOUNTER — Emergency Department (HOSPITAL_BASED_OUTPATIENT_CLINIC_OR_DEPARTMENT_OTHER)
Admission: EM | Admit: 2024-03-31 | Discharge: 2024-03-31 | Disposition: A | Discharge status: Home / Self Care | Attending: Emergency Medicine | Admitting: Emergency Medicine

## 2024-03-31 ENCOUNTER — Emergency Department (HOSPITAL_BASED_OUTPATIENT_CLINIC_OR_DEPARTMENT_OTHER)

## 2024-03-31 ENCOUNTER — Encounter (HOSPITAL_BASED_OUTPATIENT_CLINIC_OR_DEPARTMENT_OTHER): Payer: Self-pay

## 2024-03-31 ENCOUNTER — Other Ambulatory Visit: Payer: Self-pay

## 2024-03-31 DIAGNOSIS — Z91040 Latex allergy status: Secondary | ICD-10-CM | POA: Insufficient documentation

## 2024-03-31 DIAGNOSIS — R1013 Epigastric pain: Secondary | ICD-10-CM | POA: Insufficient documentation

## 2024-03-31 DIAGNOSIS — R112 Nausea with vomiting, unspecified: Secondary | ICD-10-CM | POA: Diagnosis present

## 2024-03-31 DIAGNOSIS — Z8719 Personal history of other diseases of the digestive system: Secondary | ICD-10-CM | POA: Diagnosis not present

## 2024-03-31 LAB — CBC
HCT: 36.1 % (ref 36.0–46.0)
Hemoglobin: 11 g/dL — ABNORMAL LOW (ref 12.0–15.0)
MCH: 24.8 pg — ABNORMAL LOW (ref 26.0–34.0)
MCHC: 30.5 g/dL (ref 30.0–36.0)
MCV: 81.3 fL (ref 80.0–100.0)
Platelets: 221 K/uL (ref 150–400)
RBC: 4.44 MIL/uL (ref 3.87–5.11)
RDW: 16.1 % — ABNORMAL HIGH (ref 11.5–15.5)
WBC: 5.5 K/uL (ref 4.0–10.5)
nRBC: 0 % (ref 0.0–0.2)

## 2024-03-31 LAB — COMPREHENSIVE METABOLIC PANEL WITH GFR
ALT: 23 U/L (ref 0–44)
AST: 21 U/L (ref 15–41)
Albumin: 4.2 g/dL (ref 3.5–5.0)
Alkaline Phosphatase: 69 U/L (ref 38–126)
Anion gap: 9 (ref 5–15)
BUN: 8 mg/dL (ref 6–20)
CO2: 27 mmol/L (ref 22–32)
Calcium: 9.9 mg/dL (ref 8.9–10.3)
Chloride: 101 mmol/L (ref 98–111)
Creatinine, Ser: 0.86 mg/dL (ref 0.44–1.00)
GFR, Estimated: >60 mL/min (ref >60)
Glucose, Bld: 112 mg/dL — ABNORMAL HIGH (ref 70–99)
Potassium: 3.5 mmol/L (ref 3.5–5.1)
Sodium: 138 mmol/L (ref 135–145)
Total Bilirubin: 0.3 mg/dL (ref 0.0–1.2)
Total Protein: 7.6 g/dL (ref 6.5–8.1)

## 2024-03-31 LAB — URINALYSIS, ROUTINE W REFLEX MICROSCOPIC
Bacteria, UA: NONE SEEN
Bilirubin Urine: NEGATIVE
Glucose, UA: NEGATIVE mg/dL
Ketones, ur: NEGATIVE mg/dL
Nitrite: NEGATIVE
Protein, ur: 30 mg/dL — AB
RBC / HPF: >50 RBC/hpf (ref 0–5)
Specific Gravity, Urine: 1.013 (ref 1.005–1.030)
WBC, UA: >50 WBC/hpf (ref 0–5)
pH: 6.5 (ref 5.0–8.0)

## 2024-03-31 LAB — SEDIMENTATION RATE: Sed Rate: 14 mm/h (ref 0–22)

## 2024-03-31 LAB — C-REACTIVE PROTEIN: CRP: 0.5 mg/dL (ref <1.0)

## 2024-03-31 LAB — LIPASE, BLOOD: Lipase: 22 U/L (ref 11–51)

## 2024-03-31 LAB — PREGNANCY, URINE: Preg Test, Ur: NEGATIVE

## 2024-03-31 MED ORDER — PREDNISONE 50 MG PO TABS
60.0000 mg | ORAL_TABLET | Freq: Once | ORAL | Status: AC
  Administered 2024-03-31: 60 mg via ORAL
  Filled 2024-03-31: qty 1

## 2024-03-31 MED ORDER — PREDNISONE 10 MG PO TABS: 40.0000 mg | ORAL_TABLET | Freq: Every day | ORAL | 0 refills | Status: DC

## 2024-03-31 MED ORDER — ONDANSETRON HCL 4 MG PO TABS: 4.0000 mg | ORAL_TABLET | Freq: Four times a day (QID) | ORAL | 0 refills | Status: AC

## 2024-03-31 MED ORDER — ONDANSETRON HCL 4 MG/2ML IJ SOLN
4.0000 mg | Freq: Once | INTRAMUSCULAR | Status: AC
  Administered 2024-03-31: 4 mg via INTRAVENOUS
  Filled 2024-03-31: qty 2

## 2024-03-31 MED ORDER — IOHEXOL 300 MG/ML  SOLN
100.0000 mL | Freq: Once | INTRAMUSCULAR | Status: AC | PRN
  Administered 2024-03-31: 100 mL via INTRAVENOUS

## 2024-03-31 MED ORDER — HYDROMORPHONE HCL 1 MG/ML IJ SOLN
0.5000 mg | Freq: Once | INTRAMUSCULAR | Status: AC
  Administered 2024-03-31: 0.5 mg via INTRAVENOUS
  Filled 2024-03-31: qty 1

## 2024-03-31 NOTE — Discharge Instructions (Signed)
 You were seen in the emergency department for your abdominal pain.  You did have some inflammation of your ascending colon on your CAT scan but no signs of any complications.  This is likely from your Crohn's.  Have given you a short course of steroids and you should complete this as prescribed and can take Tylenol  every 6 hours as needed for pain and Zofran  as needed for nausea.  You can follow-up with your GI doctor in the next few days to have your symptoms rechecked.  You should return to the emergency department for significantly worsening pain, repetitive vomiting despite the nausea medicine, fevers or any other new or concerning symptom.

## 2024-03-31 NOTE — ED Provider Notes (Signed)
 [place] EMERGENCY DEPARTMENT AT [facility] Provider Note   CSN: [id number] Arrival date & time: 03/31/24  1040     Patient presents with: Abdominal Pain and Emesis   Morgan Chung is a 38 y.o. female.   38 year old female with a history of Crohn's disease complicated by stricture that was surgically removed who presents to the emergency department for abdominal pain.  Over the past 3 weeks has been having cramping abdominal pain.  Epigastric.  Occurs after eating but then also occasionally with rest.  8/10 in severity.  Says that she also noticed that she has been more constipated and then has been having smaller caliber stools recently.  2 episodes of nonbloody nonbilious emesis per day.  Talk to her GI doctor who increased her Linzess but reports that her symptoms of persisted so she decided to come into the emergency department.  Denies any alcohol or drug use.  No longer takes [employer].  Denies any urinary symptoms       Prior to Admission medications   Medication Sig Start Date End Date Taking? Authorizing Provider  hydrOXYzine (ATARAX) 25 MG tablet Take 25 mg by mouth 3 (three) times daily. 03/30/24  Yes [provider]  QVAR REDIHALER 80 MCG/ACT inhaler Inhale into the lungs. 03/22/24  Yes [provider]  RESTASIS 0.05 % ophthalmic emulsion Place 1 drop into both eyes 2 (two) times daily. 03/01/24  Yes [provider]  amLODipine  (NORVASC ) 5 MG tablet Take 5 mg by mouth daily.    [provider]  benzonatate  (TESSALON ) 100 MG capsule Take 1-2 capsules (100-200 mg total) by mouth 3 (three) times daily as needed. 10/29/23   Vivienne Delon HERO, PA-C  clindamycin (CLEOCIN) 300 MG capsule Take 300 mg by mouth 3 (three) times daily. 08/18/23   [provider]  dicyclomine  (BENTYL ) 20 MG tablet Take 1 tablet (20 mg total) by mouth 2 (two) times daily. 08/31/23   Jerrol Agent, MD  docusate sodium  (COLACE) 100 MG capsule Take 1 capsule  (100 mg total) by mouth every 12 (twelve) hours. 08/31/23   Jerrol Agent, MD  famotidine  (PEPCID ) 20 MG tablet Take 1 tablet (20 mg total) by mouth daily. 08/31/23 03/13/24  Jerrol Agent, MD  Ferric Maltol (ACCRUFER) 30 MG CAPS Take 1 capsule (30 mg total) by mouth 2 (two) times daily before a meal. Take 2 hrs before, or 2 hrs after a meal. 03/13/24   Rudy Carlin LABOR, MD  fluconazole  (DIFLUCAN ) 150 MG tablet Take 1 tablet (150 mg total) by mouth as directed. Repeat in 3 days as needed 02/16/24   Moishe Chiquita HERO, NP  fluticasone  (FLONASE ) 50 MCG/ACT nasal spray Place 2 sprays into both nostrils daily. 10/29/23   Vivienne Delon HERO, PA-C  hydrochlorothiazide  (HYDRODIURIL ) 25 MG tablet Take 25 mg by mouth daily. 11/02/22   [provider]  hydrocortisone (ANUSOL-HC) 2.5 % rectal cream Place 1 Application rectally See admin instructions. *2-4 times daily as needed* 08/18/23   [provider]  ketoconazole  (NIZORAL ) 2 % shampoo Apply 1 Application topically 2 (two) times a week. 01/31/24   Vivienne Delon HERO, PA-C  lidocaine  (XYLOCAINE ) 5 % ointment Apply 1 Application topically as needed. 08/28/23   Lavell Bari LABOR, FNP  LINZESS 145 MCG CAPS capsule Take 145 mcg by mouth daily.    [provider]  mercaptopurine  (PURINETHOL ) 50 MG tablet Take by mouth. 01/10/23   [provider]  mesalamine  (CANASA ) 1000 MG suppository Place 1 suppository (1,000  mg total) rectally at bedtime. 08/28/23   Lavell Lye A, FNP  methocarbamol  (ROBAXIN ) 500 MG tablet Take 1,000 mg by mouth 4 (four) times daily. 08/18/23   [provider]  metoCLOPramide  (REGLAN ) 5 MG tablet Take 1 tablet (5 mg total) by mouth 3 (three) times daily. 10/09/23   Vivienne Delon HERO, PA-C  ondansetron  (ZOFRAN ) 4 MG tablet Take 1 tablet (4 mg total) by mouth every 4 (four) hours as needed for nausea or vomiting. 01/12/23   Elnor Jayson LABOR, DO  ondansetron  (ZOFRAN -ODT) 8 MG disintegrating tablet Take 1  tablet (8 mg total) by mouth every 8 (eight) hours as needed for nausea or vomiting. 11/07/23   Almeda Loa ORN, FNP  ondansetron  (ZOFRAN -ODT) 8 MG disintegrating tablet Take 1 tablet (8 mg total) by mouth every 8 (eight) hours as needed for nausea or vomiting. 12/13/23   Blair, Diane W, FNP  pantoprazole  (PROTONIX ) 40 MG tablet Take 1 tablet (40 mg total) by mouth daily. 08/31/23 03/13/24  Jerrol Agent, MD  polyethylene glycol powder (GLYCOLAX/MIRALAX) 17 GM/SCOOP powder Take 17 g by mouth daily.    [provider]  predniSONE  (STERAPRED UNI-PAK 21 TAB) 10 MG (21) TBPK tablet 6 day taper; take as directed on package instructions 07/22/23   Blair, Diane W, FNP  predniSONE  (STERAPRED UNI-PAK 48 TAB) 10 MG (48) TBPK tablet Take by mouth as directed. Patient not taking: Reported on 03/13/2024 08/18/23   [provider]  PROAIR  HFA 108 (90 Base) MCG/ACT inhaler Inhale 2 puffs into the lungs every 4 (four) hours as needed for wheezing or shortness of breath (ex-induced). 06/11/20   [provider]  Roflumilast (ZORYVE) 0.3 % FOAM Apply 1 application  topically daily. 03/23/24   Blair, Diane W, FNP  WEGOVY 1 MG/0.5ML SOAJ Inject 1 mg into the skin once a week. 08/13/23   [provider]  zolpidem  (AMBIEN ) 10 MG tablet Take 10 mg by mouth daily. 02/17/22   [provider]    Allergies: Banana, Other, and Latex    Review of Systems  Updated Vital Signs BP 120/85   Pulse 75   Temp 98.6 F (37 C) (Oral)   Resp 20   LMP 03/05/2024 (Exact Date)   SpO2 98%   Physical Exam Vitals and nursing note reviewed.  Constitutional:      General: She is not in acute distress.    Appearance: She is well-developed.  HENT:     Head: Normocephalic and atraumatic.     Right Ear: External ear normal.     Left Ear: External ear normal.     Nose: Nose normal.  Eyes:     Extraocular Movements: Extraocular movements intact.     Conjunctiva/sclera: Conjunctivae normal.      Pupils: Pupils are equal, round, and reactive to light.  Abdominal:     General: Abdomen is flat. There is no distension.     Palpations: Abdomen is soft. There is no mass.     Tenderness: There is abdominal tenderness (Epigastric tenderness to palp). There is no guarding.     Comments: Negative Murphy sign  Musculoskeletal:     Cervical back: Normal range of motion and neck supple.  Skin:    General: Skin is warm and dry.  Neurological:     Mental Status: She is alert and oriented to person, place, and time. Mental status is at baseline.  Psychiatric:        Mood and Affect: Mood normal.     (  all labs ordered are listed, but only abnormal results are displayed) Labs Reviewed  COMPREHENSIVE METABOLIC PANEL WITH GFR - Abnormal; Notable for the following components:      Result Value   Glucose, Bld 112 (*)    All other components within normal limits  CBC - Abnormal; Notable for the following components:   Hemoglobin 11.0 (*)    MCH 24.8 (*)    RDW 16.1 (*)    All other components within normal limits  URINALYSIS, ROUTINE W REFLEX MICROSCOPIC - Abnormal; Notable for the following components:   Color, Urine ORANGE (*)    APPearance HAZY (*)    Hgb urine dipstick LARGE (*)    Protein, ur 30 (*)    Leukocytes,Ua LARGE (*)    All other components within normal limits  LIPASE, BLOOD  PREGNANCY, URINE  SEDIMENTATION RATE  C-REACTIVE PROTEIN    EKG: None  Radiology: CT ABDOMEN PELVIS W CONTRAST Result Date: 03/31/2024 CLINICAL DATA:  Crohn's disease intermittent nausea and vomiting EXAM: CT ABDOMEN AND PELVIS WITH CONTRAST TECHNIQUE: Multidetector CT imaging of the abdomen and pelvis was performed using the standard protocol following bolus administration of intravenous contrast. RADIATION DOSE REDUCTION: This exam was performed according to the departmental dose-optimization program which includes automated exposure control, adjustment of the mA and/or kV according to patient  size and/or use of iterative reconstruction technique. CONTRAST:  OMNIPAQUE  IOHEXOL  300 MG/ML  SOLN COMPARISON:  CT 02/06/2024 FINDINGS: Lower chest: Lung bases demonstrate no acute airspace disease. Hepatobiliary: No focal liver abnormality is seen. No gallstones, gallbladder wall thickening, or biliary dilatation. Pancreas: Unremarkable. No pancreatic ductal dilatation or surrounding inflammatory changes. Spleen: Normal in size without focal abnormality. Adrenals/Urinary Tract: Adrenal glands are unremarkable. Kidneys are normal, without renal calculi, focal lesion, or hydronephrosis. Bladder is unremarkable. Stomach/Bowel: Contrast within the stomach and duodenum. No dilated small bowel. 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 Suspicion of mild wall thickening of ascending colon and hepatic flexure with minimal mucosal enhancement, for example coronal series 4 image 50 and 51, but no pericolonic stranding. Vascular/Lymphatic: No significant vascular findings are present. No enlarged abdominal or pelvic lymph nodes. Reproductive: Uterus and bilateral adnexa are unremarkable. Other: No abdominal wall hernia or abnormality. No abdominopelvic ascites. Musculoskeletal: No acute or significant osseous findings. IMPRESSION: 1. Status post terminal ileal resection with right lower quadrant ileocolic anastomosis. No evidence for a bowel obstruction. Suspicion of mild wall thickening of ascending colon and hepatic flexure with minimal mucosal enhancement, but no pericolonic stranding. Findings could be secondary to mild colitis/history of Crohn's. 2. Otherwise no CT evidence for acute intra-abdominal or pelvic abnormality. Electronically Signed   By: Luke Bun M.D.   On: 03/31/2024 17:00     Procedures   Medications Ordered in the ED  ondansetron  (ZOFRAN ) injection 4 mg (4 mg Intravenous Given 03/31/24 1519)  HYDROmorphone  (DILAUDID ) injection 0.5 mg (0.5 mg  Intravenous Given 03/31/24 1519)  iohexol  (OMNIPAQUE ) 300 MG/ML solution 100 mL (100 mLs Intravenous Contrast Given 03/31/24 1633)    Clinical Course as of 03/31/24 1717  Fri Mar 31, 2024  1546 Signed out to Dr. Ellouise [RP]    Clinical Course User Index [RP] Yolande Lamar BROCKS, MD                                 Medical Decision Making Amount and/or Complexity of Data Reviewed Labs:  ordered. Radiology: ordered.  Risk Prescription drug management.   38 year old female with a history of Crohn's disease complicated by stricture that was surgically removed who presents to the emergency department for abdominal pain.   Initial Ddx:  Crohn's colitis, stricture, bowel obstruction, gastritis, peptic ulcer disease, cholecystitis  MDM/Course:  Patient presents to the emergency department with epigastric abdominal pain.  Also reports that she has had some stool caliber changes recently.  Has had nausea and vomiting but is still passing gas.  Has had a bowel resection in the past but no other abdominal surgeries.  On exam no significant tenderness to palpation aside from some mild epigastric tenderness to palpation.  Obtained blood work including CBC, CMP, and ESR that were unremarkable.  Urinalysis does have white blood cells, red blood cells, and leukocyte esterase however patient is asymptomatic at this point in time.  She was given pain medication and nausea medication and signed out to the oncoming physician awaiting imaging results.  This patient presents to the ED for concern of complaints listed in HPI, this involves an extensive number of treatment options, and is a complaint that carries with it a high risk of complications and morbidity. Disposition including potential need for admission considered.   Dispo: Pending remainder of workup  Records reviewed Outpatient Clinic Notes The following labs were independently interpreted: Chemistry and show no acute abnormality I  independently reviewed the following imaging with scope of interpretation limited to determining acute life threatening conditions related to emergency care: CT Abdomen/Pelvis and agree with the radiologist interpretation with the following exceptions: none I personally reviewed and interpreted cardiac monitoring: normal sinus rhythm  I personally reviewed and interpreted the pt's EKG: see above for interpretation  I have reviewed the patients home medications and made adjustments as needed  Portions of this note were generated with Dragon dictation software. Dictation errors may occur despite best attempts at proofreading.     Final diagnoses:  Epigastric abdominal pain  Nausea and vomiting, unspecified vomiting type  History of Crohn's disease    ED Discharge Orders     None          Yolande Lamar BROCKS, MD 03/31/24 [phone]

## 2024-03-31 NOTE — ED Triage Notes (Signed)
 Patient arrives with intermittent abdominal pain and nausea/vomiting for about 3 weeks. Has contacted her gastroenterologist. Still is not getting any better despite medication change.

## 2024-03-31 NOTE — ED Notes (Signed)
 Pt given saltines and water to PO challenge at this time

## 2024-03-31 NOTE — ED Provider Notes (Signed)
 Patient signed out to me at 1530 by Dr. Jakie pending inflammatory markers and CTAP.  In short this is a 38 year old female with a past medical history of Crohn's presenting to the emergency department with abdominal pain.  She is hemodynamically stable on arrival in no acute distress.  She had labs initiated by previous team that are overall within normal range.  Patient's sed rate was normal.  She was given pain and nausea control.  CTAP showed inflammation of the ascending colon consistent with Crohn's.  On my evaluation, the patient reports her pain is significantly improved but is still feeling nauseous.  Abdomen is soft and minimally tender.  Patient will be given another dose of Zofran  and will be given dose of steroids and will have p.o. challenge.  She should be able to be discharged home if she is able to tolerate p.o.  6:57 PM Patient able to tolerate PO and feels improved. She's stable for discharge home with outpatient GI follow up.   Kingsley, Anieya Helman K, DO 03/31/24 [phone]

## 2024-04-06 ENCOUNTER — Telehealth: Admitting: Physician Assistant

## 2024-04-06 DIAGNOSIS — M545 Low back pain, unspecified: Secondary | ICD-10-CM

## 2024-04-06 MED ORDER — NAPROXEN 500 MG PO TABS: 500.0000 mg | ORAL_TABLET | Freq: Two times a day (BID) | ORAL | 0 refills | Status: DC

## 2024-04-06 MED ORDER — CYCLOBENZAPRINE HCL 10 MG PO TABS: 10.0000 mg | ORAL_TABLET | Freq: Three times a day (TID) | ORAL | 0 refills | Status: AC | PRN

## 2024-04-06 NOTE — Progress Notes (Signed)
 We are sorry that you are not feeling well.  Here is how we plan to help!  Based on what you have shared with me it looks like you mostly have acute back pain.  Acute back pain is defined as musculoskeletal pain that can resolve in 1-3 weeks with conservative treatment.  I have prescribed Naprosyn  500 mg take one by mouth twice a day non-steroid anti-inflammatory (NSAID) as well as Flexeril  10 mg every eight hours as needed which is a muscle relaxer  Some patients experience stomach irritation or in increased heartburn with anti-inflammatory drugs.  Please keep in mind that muscle relaxer's can cause fatigue and should not be taken while at work or driving.    Lidocaine  patches at prescription strength 5% are showing not covered by insurance. You will want to get the OTC patches (4%) to use as directed.  Back pain is very common.  The pain often gets better over time.  The cause of back pain is usually not dangerous.  Most people can learn to manage their back pain on their own.  Home Care Stay active.  Start with short walks on flat ground if you can.  Try to walk farther each day. Do not sit, drive or stand in one place for more than 30 minutes.  Do not stay in bed. Do not avoid exercise or work.  Activity can help your back heal faster. Be careful when you bend or lift an object.  Bend at your knees, keep the object close to you, and do not twist. Sleep on a firm mattress.  Lie on your side, and bend your knees.  If you lie on your back, put a pillow under your knees. Only take medicines as told by your doctor. Put ice on the injured area. Put ice in a plastic bag Place a towel between your skin and the bag Leave the ice on for 15-20 minutes, 3-4 times a day for the first 2-3 days. 210 After that, you can switch between ice and heat packs. Ask your doctor about back exercises or massage. Avoid feeling anxious or stressed.  Find good ways to deal with stress, such as exercise.  Get Help  Right Way If: Your pain does not go away with rest or medicine. Your pain does not go away in 1 week. You have new problems. You do not feel well. The pain spreads into your legs. You cannot control when you poop (bowel movement) or pee (urinate) You feel sick to your stomach (nauseous) or throw up (vomit) You have belly (abdominal) pain. You feel like you may pass out (faint). If you develop a fever.  Make Sure you: Understand these instructions. Will watch your condition Will get help right away if you are not doing well or get worse.  Your e-visit answers were reviewed by a board certified advanced clinical practitioner to complete your personal care plan.  Depending on the condition, your plan could have included both over the counter or prescription medications.  If there is a problem please reply  once you have received a response from your provider.  Your safety is important to us .  If you have drug allergies check your prescription carefully.    You can use MyChart to ask questions about today's visit, request a non-urgent call back, or ask for a work or school excuse for 24 hours related to this e-Visit. If it has been greater than 24 hours you will need to follow up with your provider,  or enter a new e-Visit to address those concerns.  You will get an e-mail in the next two days asking about your experience.  I hope that your e-visit has been valuable and will speed your recovery. Thank you for using e-visits.   I have spent 5 minutes in review of e-visit questionnaire, review and updating patient chart, medical decision making and response to patient.   Elsie Velma Lunger, PA-C

## 2024-04-11 ENCOUNTER — Other Ambulatory Visit: Payer: Self-pay

## 2024-04-11 ENCOUNTER — Emergency Department (HOSPITAL_BASED_OUTPATIENT_CLINIC_OR_DEPARTMENT_OTHER)

## 2024-04-11 ENCOUNTER — Encounter (HOSPITAL_BASED_OUTPATIENT_CLINIC_OR_DEPARTMENT_OTHER): Payer: Self-pay | Admitting: Emergency Medicine

## 2024-04-11 ENCOUNTER — Emergency Department (HOSPITAL_BASED_OUTPATIENT_CLINIC_OR_DEPARTMENT_OTHER)
Admission: EM | Admit: 2024-04-11 | Discharge: 2024-04-11 | Disposition: A | Discharge status: Home / Self Care | Attending: Emergency Medicine | Admitting: Emergency Medicine

## 2024-04-11 DIAGNOSIS — K5090 Crohn's disease, unspecified, without complications: Secondary | ICD-10-CM | POA: Diagnosis not present

## 2024-04-11 DIAGNOSIS — K8020 Calculus of gallbladder without cholecystitis without obstruction: Secondary | ICD-10-CM | POA: Insufficient documentation

## 2024-04-11 DIAGNOSIS — R1084 Generalized abdominal pain: Secondary | ICD-10-CM

## 2024-04-11 DIAGNOSIS — R1030 Lower abdominal pain, unspecified: Secondary | ICD-10-CM | POA: Diagnosis present

## 2024-04-11 DIAGNOSIS — Z91040 Latex allergy status: Secondary | ICD-10-CM | POA: Diagnosis not present

## 2024-04-11 LAB — URINALYSIS, ROUTINE W REFLEX MICROSCOPIC
Bacteria, UA: NONE SEEN
Bilirubin Urine: NEGATIVE
Glucose, UA: NEGATIVE mg/dL
Hgb urine dipstick: NEGATIVE
Ketones, ur: NEGATIVE mg/dL
Leukocytes,Ua: NEGATIVE
Nitrite: NEGATIVE
Protein, ur: NEGATIVE mg/dL
Specific Gravity, Urine: 1.014 (ref 1.005–1.030)
pH: 6.5 (ref 5.0–8.0)

## 2024-04-11 LAB — COMPREHENSIVE METABOLIC PANEL WITH GFR
ALT: 22 U/L (ref 0–44)
AST: 17 U/L (ref 15–41)
Albumin: 4 g/dL (ref 3.5–5.0)
Alkaline Phosphatase: 55 U/L (ref 38–126)
Anion gap: 11 (ref 5–15)
BUN: 6 mg/dL (ref 6–20)
CO2: 24 mmol/L (ref 22–32)
Calcium: 9.3 mg/dL (ref 8.9–10.3)
Chloride: 103 mmol/L (ref 98–111)
Creatinine, Ser: 0.82 mg/dL (ref 0.44–1.00)
GFR, Estimated: >60 mL/min (ref >60)
Glucose, Bld: 97 mg/dL (ref 70–99)
Potassium: 3.5 mmol/L (ref 3.5–5.1)
Sodium: 138 mmol/L (ref 135–145)
Total Bilirubin: 0.3 mg/dL (ref 0.0–1.2)
Total Protein: 6.7 g/dL (ref 6.5–8.1)

## 2024-04-11 LAB — CBC
HCT: 34 % — ABNORMAL LOW (ref 36.0–46.0)
Hemoglobin: 10.7 g/dL — ABNORMAL LOW (ref 12.0–15.0)
MCH: 25.5 pg — ABNORMAL LOW (ref 26.0–34.0)
MCHC: 31.5 g/dL (ref 30.0–36.0)
MCV: 81 fL (ref 80.0–100.0)
Platelets: 182 K/uL (ref 150–400)
RBC: 4.2 MIL/uL (ref 3.87–5.11)
RDW: 18.9 % — ABNORMAL HIGH (ref 11.5–15.5)
WBC: 5.3 K/uL (ref 4.0–10.5)
nRBC: 0 % (ref 0.0–0.2)

## 2024-04-11 LAB — LIPASE, BLOOD: Lipase: 16 U/L (ref 11–51)

## 2024-04-11 LAB — HCG, QUANTITATIVE, PREGNANCY: hCG, Beta Chain, Quant, S: <1 m[IU]/mL (ref <5)

## 2024-04-11 MED ORDER — MORPHINE SULFATE (PF) 2 MG/ML IV SOLN
2.0000 mg | Freq: Once | INTRAVENOUS | Status: AC
  Administered 2024-04-11: 2 mg via INTRAVENOUS
  Filled 2024-04-11: qty 1

## 2024-04-11 MED ORDER — LACTATED RINGERS IV BOLUS
500.0000 mL | Freq: Once | INTRAVENOUS | Status: AC
  Administered 2024-04-11: 500 mL via INTRAVENOUS

## 2024-04-11 MED ORDER — IOHEXOL 300 MG/ML  SOLN
100.0000 mL | Freq: Once | INTRAMUSCULAR | Status: AC | PRN
  Administered 2024-04-11: 100 mL via INTRAVENOUS

## 2024-04-11 MED ORDER — ONDANSETRON HCL 4 MG/2ML IJ SOLN
4.0000 mg | Freq: Once | INTRAMUSCULAR | Status: AC
  Administered 2024-04-11: 4 mg via INTRAVENOUS
  Filled 2024-04-11: qty 2

## 2024-04-11 NOTE — ED Triage Notes (Signed)
 Reports abd pain w/ distention. Seen here for same earlier this month. Reports recent constipation. Denies fever.   Hx of Chrons.

## 2024-04-11 NOTE — ED Provider Notes (Signed)
 [place] EMERGENCY DEPARTMENT AT [facility] Provider Note   CSN: [id number] Arrival date & time: 04/11/24  9260     Patient presents with: Abdominal Pain   Morgan Chung is a 38 y.o. female.   HPI 38 year old female history of Crohn's, history of bowel obstructions status post bowel resection presents today complaining of abdominal discomfort.  She states that over the past 5 weeks she has had episodes where she felt like she was constipated.  She would need to take medications for constipation and after several days would have a bowel movement but then felt like the symptoms began again.  She has early satiety and heartburn.  Currently has pain diffusely in her lower abdomen.  She denies any diarrhea, fever, or chills.  She reports that she was off of her Crohn's medications but has started these back last summer.  She is followed at [facility].  She was seen here November 14 for similar symptoms and at that time had a CT scan and labs.  She was to follow-up with her gastroenterologist at [facility] but cannot be scheduled until February 2026.  She reports normal menstrual cycles and is not sexually active.  During her last visit she had greater than 50 white blood cells and red blood cells in her urine but did not have urinary tract infection symptoms and was not treated for UTI.  She reports that she does not currently have any UTI symptoms    Prior to Admission medications   Medication Sig Start Date End Date Taking? Authorizing Provider  amLODipine  (NORVASC ) 5 MG tablet Take 5 mg by mouth daily.   Yes [provider]  Ferric Maltol  (ACCRUFER ) 30 MG CAPS Take 1 capsule (30 mg total) by mouth 2 (two) times daily before a meal. Take 2 hrs before, or 2 hrs after a meal. 03/13/24  Yes Rudy Carlin LABOR, MD  hydrochlorothiazide  (HYDRODIURIL ) 25 MG tablet Take 25 mg by mouth daily. 11/02/22  Yes [provider]  HYDROmorphone  (DILAUDID ) 2 MG tablet Take 2 mg by mouth 3 (three) times  daily.   Yes [provider]  LINZESS 145 MCG CAPS capsule Take 145 mcg by mouth daily.   Yes [provider]  mesalamine  (CANASA ) 1000 MG suppository Place 1 suppository (1,000 mg total) rectally at bedtime. 08/28/23  Yes Hawks, Christy A, FNP  ondansetron  (ZOFRAN ) 4 MG tablet Take 1 tablet (4 mg total) by mouth every 6 (six) hours. 03/31/24  Yes Kingsley, Victoria K, DO  ondansetron  (ZOFRAN -ODT) 8 MG disintegrating tablet Take 1 tablet (8 mg total) by mouth every 8 (eight) hours as needed for nausea or vomiting. 11/07/23  Yes Blair, Diane W, FNP  phentermine (ADIPEX-P) 37.5 MG tablet Take 37.5 mg by mouth daily. 04/04/24  Yes [provider]  polyethylene glycol powder (GLYCOLAX/MIRALAX) 17 GM/SCOOP powder Take 17 g by mouth daily.   Yes [provider]  PROAIR  HFA 108 (90 Base) MCG/ACT inhaler Inhale 2 puffs into the lungs every 4 (four) hours as needed for wheezing or shortness of breath (ex-induced). 06/11/20  Yes [provider]  QVAR REDIHALER 80 MCG/ACT inhaler Inhale into the lungs. 03/22/24  Yes [provider]  RESTASIS 0.05 % ophthalmic emulsion Place 1 drop into both eyes 2 (two) times daily. 03/01/24  Yes [provider]  Roflumilast  (ZORYVE ) 0.3 % FOAM Apply 1 application  topically daily. 03/23/24  Yes Blair, Diane W, FNP  Vitamin D, Ergocalciferol, (DRISDOL) 1.25 MG (50000 UNIT) CAPS capsule Take  50,000 Units by mouth once a week.   Yes [provider]  zolpidem  (AMBIEN ) 10 MG tablet Take 10 mg by mouth daily. 02/17/22  Yes [provider]  cyclobenzaprine  (FLEXERIL ) 10 MG tablet Take 1 tablet (10 mg total) by mouth 3 (three) times daily as needed. 04/06/24   Gladis Elsie BROCKS, PA-C  hydrocortisone (ANUSOL-HC) 2.5 % rectal cream Place 1 Application rectally See admin instructions. *2-4 times daily as needed* 08/18/23   [provider]  mercaptopurine  (PURINETHOL ) 50 MG tablet Take by mouth. 01/10/23    [provider]    Allergies: Banana, Other, and Latex    Review of Systems  Updated Vital Signs BP 124/86   Pulse 73   Temp 97.9 F (36.6 C) (Oral)   Resp 16   LMP 03/05/2024 (Exact Date)   SpO2 100%   Physical Exam Vitals and nursing note reviewed.  Constitutional:      Appearance: She is well-developed.  HENT:     Head: Normocephalic and atraumatic.     Mouth/Throat:     Mouth: Mucous membranes are moist.  Eyes:     Extraocular Movements: Extraocular movements intact.  Cardiovascular:     Rate and Rhythm: Normal rate and regular rhythm.  Pulmonary:     Effort: Pulmonary effort is normal.     Breath sounds: Normal breath sounds.  Abdominal:     General: Abdomen is flat. Bowel sounds are normal.     Palpations: Abdomen is soft.     Tenderness: There is abdominal tenderness in the right lower quadrant and suprapubic area.  Skin:    General: Skin is warm and dry.     Capillary Refill: Capillary refill takes less than 2 seconds.  Neurological:     General: No focal deficit present.     Mental Status: She is alert.  Psychiatric:        Mood and Affect: Mood normal.     (all labs ordered are listed, but only abnormal results are displayed) Labs Reviewed  CBC - Abnormal; Notable for the following components:      Result Value   Hemoglobin 10.7 (*)    HCT 34.0 (*)    MCH 25.5 (*)    RDW 18.9 (*)    All other components within normal limits  COMPREHENSIVE METABOLIC PANEL WITH GFR  LIPASE, BLOOD  URINALYSIS, ROUTINE W REFLEX MICROSCOPIC  HCG, QUANTITATIVE, PREGNANCY    EKG: None  Radiology: US  Abdomen Limited RUQ (LIVER/GB) Result Date: 04/11/2024 CLINICAL DATA:  [id number] Pain [id number] EXAM: ULTRASOUND ABDOMEN LIMITED RIGHT UPPER QUADRANT COMPARISON:  April 11, 2024, March 31, 2024 FINDINGS: Gallbladder: Layering, shadowing gallstones. No wall thickening or pericholecystic fluid. No sonographic Murphy's sign noted by sonographer. Common bile duct:  Diameter: 2.3 mm Liver: Normal echogenicity. No focal lesion identified. No intrahepatic biliary ductal dilation. Portal vein is patent on color Doppler imaging with normal direction of blood flow towards the liver. Right Kidney: Partially visualized. No mass. No hydronephrosis or nephrolithiasis. Other: None. IMPRESSION: Cholecystolithiasis. No changes of acute cholecystitis. Electronically Signed   By: Rogelia Myers M.D.   On: 04/11/2024 12:37   CT ABDOMEN PELVIS W CONTRAST Result Date: 04/11/2024 EXAM: CT ABDOMEN AND PELVIS WITH CONTRAST 04/11/2024 10:25:26 AM TECHNIQUE: CT of the abdomen and pelvis was performed with the administration of 100 mL of iohexol  (OMNIPAQUE ) 300 MG/ML solution. Multiplanar reformatted images are provided for review. Automated exposure control, iterative reconstruction, and/or weight-based adjustment of the mA/kV was utilized to  reduce the radiation dose to as low as reasonably achievable. COMPARISON: 03/31/2024 CLINICAL HISTORY: Abdominal pain with distention, history of Crohn's disease and bowel resection. FINDINGS: LOWER CHEST: No acute abnormality. LIVER: The liver is unremarkable. GALLBLADDER AND BILE DUCTS: Dependent density in the gallbladder with some speckled low density foci, appearance compatible with cholelithiasis with nitrogen gas phenomenon. No biliary ductal dilatation. SPLEEN: Small amount of accessory splenic tissue below the spleen. PANCREAS: No acute abnormality. ADRENAL GLANDS: No acute abnormality. KIDNEYS, URETERS AND BLADDER: No stones in the kidneys or ureters. No hydronephrosis. No perinephric or periureteral stranding. Urinary bladder is unremarkable. GI AND BOWEL: Stomach demonstrates no acute abnormality. Prior terminal ileal resection with expected appearance of the ileocolic anastomosis. No dilated bowel. There is no bowel obstruction. PERITONEUM AND RETROPERITONEUM: No ascites. No free air. VASCULATURE: Aorta is normal in caliber. LYMPH NODES: No  lymphadenopathy. REPRODUCTIVE ORGANS: Endometrial stripe thickness 1.8 cm, mildly prominent. Fluid noted along the cervical canal with some indistinct tissue planes around the cervix, inflammation in this vicinity is not excluded. BONES AND SOFT TISSUES: Trace stranding in the anterior perirectal space as on image 64 series 2, new compared to the prior exam. Mild spurring of both femoral heads. No acute osseous abnormality. IMPRESSION: 1. Mildly prominent endometrial stripe thickness (1.8 cm) and fluid along the cervical canal with indistinct tissue planes around the cervix; inflammation in this vicinity is not excluded. Correlate with any signs or symptoms of inflammation or pelvic inflammatory disease. 2. Trace stranding in the anterior perirectal space, new compared to the prior exam. 3. Cholelithiasis. 4. Mild spurring of both femoral heads. Electronically signed by: Ryan Salvage MD 04/11/2024 11:00 AM EST RP Workstation: [id number]     Procedures   Medications Ordered in the ED  lactated ringers  bolus 500 mL (0 mLs Intravenous Stopped 04/11/24 0849)  ondansetron  (ZOFRAN ) injection 4 mg (4 mg Intravenous Given 04/11/24 0819)  morphine  (PF) 2 MG/ML injection 2 mg (2 mg Intravenous Given 04/11/24 0820)  iohexol  (OMNIPAQUE ) 300 MG/ML solution 100 mL (100 mLs Intravenous Contrast Given 04/11/24 1020)    Clinical Course as of 04/11/24 1302  Tue Apr 11, 2024  0924 CBC reviewed and interpreted with anemia hemoglobin 10.7 however this is stable from prior with increased RDW most consistent with iron  deficiency Complete metabolic panel is reviewed and interpreted and within normal limits Lipase is reviewed interpreted within normal limits Urinalysis shows 0-5 red blood cells, 0-5 white blood cells, 0-5 squamous epithelial cells and no bacteria [DR]  1110 CT reviewed with mildly prominent endometrial stripe thickness and fluid along the cervical canal with indistinct tissue planes around the cervix  inflammation vicinity not excluded Trace stranding anterior perirectal space new compared to prior Cholelithiasis Secondary to these findings patient is to have pelvic exam and right upper quadrant ultrasound [DR]    Clinical Course User Index [DR] Levander Houston, MD                                 Medical Decision Making Amount and/or Complexity of Data Reviewed Labs: ordered. Radiology: ordered.  Risk Prescription drug management.   66 female history of Crohn's presents today complaining of nausea, reflux, diffuse abdominal pain, and feels like she has been constipated and is concerned that she has a bowel obstruction which she has had in the past.  She had been off of her Crohn's medications but has been back on medicines for the  past 2 months.  Prior to that she was on [facility] but did not has not taking that.  She has had weight loss. Patient was evaluated with physical exam and has some diffuse tenderness of her abdomen Patient is evaluated here with labs which are significant for minor anemia but are stable from prior and she has known history of this and just received iron  infusions. CT was obtained which shows a mildly prominent endometrial stripe thickness and some fluid in the cervical canal.  She has not been sexually active for 3 years.  She denies any abnormal vaginal discharge.  Given this, doubt STI or treatment or pelvic exam is indicated. Urinalysis is clear CT did show gallstones.  Right upper quadrant ultrasound shows gallstones with no evidence of acute cholecystitis. Given patient's epigastric discomfort, symptoms of reflux, early satiety, I am suspicious that patient is having symptoms from her gallstones.  Plan on referral to general surgery. Patient has been restricting her diet to tolerate to clear liquids.  She uses Zofran  as needed.  She is followed at [facility] for her Crohn's. We have discussed return precautions including any worsening pain, fever, chills or inability  to tolerate liquids. Plan referral to general surgery for evaluation of her gallstones and likely symptomS from same     Final diagnoses:  Generalized abdominal pain  Crohn's disease without complication, unspecified gastrointestinal tract location [facility])  Gallstones    ED Discharge Orders     None          Levander Houston, MD 04/11/24 1302
